# Patient Record
Sex: Female | Born: 1940 | Race: White | Hispanic: No | Smoking: Current every day smoker
Health system: Southern US, Community
[De-identification: ages and names within clinical notes are randomized; demographics above are authoritative.]

## PROBLEM LIST (undated history)

## (undated) DIAGNOSIS — I219 Acute myocardial infarction, unspecified: Secondary | ICD-10-CM

## (undated) DIAGNOSIS — I70229 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, unspecified extremity: Secondary | ICD-10-CM

## (undated) DIAGNOSIS — I739 Peripheral vascular disease, unspecified: Secondary | ICD-10-CM

## (undated) DIAGNOSIS — F32A Depression, unspecified: Secondary | ICD-10-CM

## (undated) DIAGNOSIS — I714 Abdominal aortic aneurysm, without rupture: Secondary | ICD-10-CM

## (undated) DIAGNOSIS — J449 Chronic obstructive pulmonary disease, unspecified: Secondary | ICD-10-CM

## (undated) DIAGNOSIS — I10 Essential (primary) hypertension: Secondary | ICD-10-CM

## (undated) DIAGNOSIS — M25559 Pain in unspecified hip: Secondary | ICD-10-CM

## (undated) DIAGNOSIS — M1990 Unspecified osteoarthritis, unspecified site: Secondary | ICD-10-CM

## (undated) DIAGNOSIS — E1151 Type 2 diabetes mellitus with diabetic peripheral angiopathy without gangrene: Secondary | ICD-10-CM

## (undated) DIAGNOSIS — C801 Malignant (primary) neoplasm, unspecified: Secondary | ICD-10-CM

## (undated) DIAGNOSIS — I745 Embolism and thrombosis of iliac artery: Secondary | ICD-10-CM

## (undated) DIAGNOSIS — G4730 Sleep apnea, unspecified: Secondary | ICD-10-CM

## (undated) DIAGNOSIS — F329 Major depressive disorder, single episode, unspecified: Secondary | ICD-10-CM

## (undated) DIAGNOSIS — K219 Gastro-esophageal reflux disease without esophagitis: Secondary | ICD-10-CM

## (undated) DIAGNOSIS — Z5189 Encounter for other specified aftercare: Secondary | ICD-10-CM

## (undated) DIAGNOSIS — E039 Hypothyroidism, unspecified: Secondary | ICD-10-CM

## (undated) DIAGNOSIS — E1159 Type 2 diabetes mellitus with other circulatory complications: Secondary | ICD-10-CM

## (undated) HISTORY — DX: Unspecified osteoarthritis, unspecified site: M19.90

## (undated) HISTORY — PX: TUBAL LIGATION: SHX77

## (undated) HISTORY — DX: 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, unspecified extremity: I70.229

## (undated) HISTORY — PX: CHOLECYSTECTOMY: SHX55

## (undated) HISTORY — PX: APPENDECTOMY: SHX54

## (undated) HISTORY — DX: Embolism and thrombosis of iliac artery: I74.5

## (undated) HISTORY — DX: Type 2 diabetes mellitus with diabetic peripheral angiopathy without gangrene: E11.51

## (undated) HISTORY — PX: PR VEIN BYPASS GRAFT,AORTO-FEM-POP: 35551

## (undated) HISTORY — PX: OTHER SURGICAL HISTORY: SHX169

## (undated) HISTORY — DX: Peripheral vascular disease, unspecified: I73.9

## (undated) HISTORY — DX: Pain in unspecified hip: M25.559

## (undated) HISTORY — DX: Abdominal aortic aneurysm, without rupture: I71.4

## (undated) HISTORY — DX: Malignant (primary) neoplasm, unspecified: C80.1

## (undated) HISTORY — DX: Type 2 diabetes mellitus with other circulatory complications: E11.59

---

## 1999-04-11 ENCOUNTER — Ambulatory Visit: Admission: RE | Admit: 1999-04-11 | Discharge: 1999-04-11 | Payer: Self-pay

## 2007-09-23 ENCOUNTER — Ambulatory Visit: Payer: Self-pay | Admitting: Vascular Surgery

## 2007-10-23 HISTORY — PX: BREAST SURGERY: SHX581

## 2008-12-09 ENCOUNTER — Ambulatory Visit: Payer: Self-pay | Admitting: Vascular Surgery

## 2009-10-22 DIAGNOSIS — Z5189 Encounter for other specified aftercare: Secondary | ICD-10-CM

## 2009-10-22 DIAGNOSIS — IMO0001 Reserved for inherently not codable concepts without codable children: Secondary | ICD-10-CM

## 2009-10-22 HISTORY — DX: Encounter for other specified aftercare: Z51.89

## 2009-10-22 HISTORY — DX: Reserved for inherently not codable concepts without codable children: IMO0001

## 2009-12-13 ENCOUNTER — Ambulatory Visit: Payer: Self-pay | Admitting: Vascular Surgery

## 2010-11-24 ENCOUNTER — Ambulatory Visit: Admit: 2010-11-24 | Payer: Self-pay | Admitting: Vascular Surgery

## 2010-11-24 ENCOUNTER — Ambulatory Visit: Admit: 2010-11-24 | Payer: Self-pay

## 2010-11-24 ENCOUNTER — Ambulatory Visit (INDEPENDENT_AMBULATORY_CARE_PROVIDER_SITE_OTHER): Payer: MEDICARE

## 2010-11-24 ENCOUNTER — Encounter (INDEPENDENT_AMBULATORY_CARE_PROVIDER_SITE_OTHER): Payer: MEDICARE

## 2010-11-24 DIAGNOSIS — I70219 Atherosclerosis of native arteries of extremities with intermittent claudication, unspecified extremity: Secondary | ICD-10-CM

## 2010-11-28 ENCOUNTER — Ambulatory Visit (INDEPENDENT_AMBULATORY_CARE_PROVIDER_SITE_OTHER): Payer: MEDICARE | Admitting: Vascular Surgery

## 2010-11-28 DIAGNOSIS — I70219 Atherosclerosis of native arteries of extremities with intermittent claudication, unspecified extremity: Secondary | ICD-10-CM

## 2010-12-01 NOTE — Consult Note (Signed)
NEW PATIENT CONSULTATION  Alicia, Saunders DOB:  [birth year]                                       11/28/2010 [id number]#:[id number]  The patient is a 70 year old female known to our practice, having previously had right iliac PTA and stenting by Dr. Anselmo Pickler in 1995.  Her claudication symptoms were completely relieved but she has continued to have some back problems due to a bulging lumbar disk.  Over the last few years she has developed increasing discomfort to the left hip greater than the right, in which she develops heaviness and discomfort extending down into the thigh and calf areas after walking only a short period of time (a few minutes).  This is relieved by rest and then recurs if she walks further.  She also has occasional instability and discomfort in her legs when she first arises, which is of a different nature.  She has no history of nonhealing ulcers, infection, cellulitis or gangrene, but the walking symptoms are now limiting her.  CHRONIC MEDICAL PROBLEMS: 1. Diabetes. 2. Hypertension. 3. Hyperlipidemia. 4. History of breast cancer treated by radiation and chemotherapy. 5. Negative for coronary artery disease, COPD or stroke.  SOCIAL HISTORY:  She is married, has 4 children and is retired. Continues to smoke a pack of cigarettes per day for 50+ years.  Does not use alcohol.  FAMILY HISTORY:  Positive for stroke, coronary artery disease and diabetes in her mother, diabetes in a brother.  REVIEW OF SYSTEMS:  Negative chest pain, dyspnea on exertion, PND, orthopnea.  No bronchitis, hemoptysis.  Does have occasional productive cough.  Complains of lower back discomfort.  All other systems in complete review of systems are negative.  PHYSICAL EXAMINATION:  Blood pressure 183/80, heart rate 73, respirations 18.  General:  She is a well-developed, well-nourished female in no apparent distress, alert and oriented x3.  HEENT:  Exam normal for  age.  EOMs intact.  Lungs:  Clear to auscultation.  No rhonchi or wheezing.  Cardiovascular:  Regular rhythm.  No murmurs. Carotid pulses are 3+.  No audible bruits.  Abdomen:  Soft, nontender, with no palpable masses.  Musculoskeletal:  Free of major deformities. Neurologic:  Normal.  Skin:  Free of rashes.  Lower extremity exam reveals 3+ femoral pulse on the right, 1+ femoral pulse on the left. She has 1+ to 2+ popliteal and 2+ posterior tibial pulses bilaterally.  Today I have reviewed and interpreted her lower extremity arterial Doppler study, which revealed an ABI of 0.82 on the left, which is down from 0.95 a year ago, and 0.97 on the right.  She did drop her pressure significantly after exercise after 2.5 minutes, when developing symptoms in her hips and thighs.  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  Arranged for angiograms be done by Dr. Imogene Burn on Thursday, February 16, with PTA and stenting to be performed at that time if indicated.    [name], M.D. Electronically Signed  JDL/MEDQ  D:  11/28/2010  T:  11/29/2010  Job:  [id number]  cc:   Dr. Foye Deer

## 2010-12-07 ENCOUNTER — Ambulatory Visit (HOSPITAL_COMMUNITY)
Admission: RE | Admit: 2010-12-07 | Discharge: 2010-12-07 | Disposition: A | Discharge status: Home / Self Care | Payer: Medicare Other | Source: Ambulatory Visit | Attending: Vascular Surgery | Admitting: Vascular Surgery

## 2010-12-07 DIAGNOSIS — I70219 Atherosclerosis of native arteries of extremities with intermittent claudication, unspecified extremity: Secondary | ICD-10-CM | POA: Insufficient documentation

## 2010-12-07 DIAGNOSIS — I7092 Chronic total occlusion of artery of the extremities: Secondary | ICD-10-CM

## 2010-12-07 DIAGNOSIS — Z01812 Encounter for preprocedural laboratory examination: Secondary | ICD-10-CM | POA: Insufficient documentation

## 2010-12-07 DIAGNOSIS — Z01818 Encounter for other preprocedural examination: Secondary | ICD-10-CM | POA: Insufficient documentation

## 2010-12-07 LAB — POCT I-STAT, CHEM 8
BUN: 12 mg/dL (ref 6–23)
Potassium: 4.2 mEq/L (ref 3.5–5.1)
Sodium: 139 mEq/L (ref 135–145)
TCO2: 28 mmol/L (ref 0–100)

## 2010-12-07 LAB — GLUCOSE, CAPILLARY
Glucose-Capillary: 219 mg/dL — ABNORMAL HIGH (ref 70–99)
Glucose-Capillary: 307 mg/dL — ABNORMAL HIGH (ref 70–99)

## 2010-12-11 NOTE — Op Note (Signed)
[name], [name]               ACCOUNT NO.:  [id number]  MEDICAL RECORD NO.:  [id number]           PATIENT TYPE:  O  LOCATION:  [facility]SC                         FACILITY:  [facility]  PHYSICIAN:  Fransisco Hertz, MD       DATE OF BIRTH:  [birth year]  DATE OF PROCEDURE:  12/07/2010 DATE OF DISCHARGE:                              OPERATIVE REPORT   PROCEDURE: 1. Left common femoral artery cannulation under ultrasound guidance. 2. Aortogram. 3. Bilateral leg runoff.  PREOPERATIVE DIAGNOSIS:  Thigh claudication, left greater than right.  POSTOPERATIVE DIAGNOSIS:  Thigh claudication, left greater than right.  SURGEON:  Fransisco Hertz, MD.  ESTIMATED BLOOD LOSS:  Minimal.  CONTRAST:  117 mL.  ANESTHESIA:  Conscious sedation.  FINDINGS: 1. A patent aorta. 2. Patent bilateral renal arteries. 3. Patent superior mesenteric artery. 4. The aorta tapers until distally it is only about 7 mm with a     calcified plaque on the left side of the distal aorta extending     into the left common iliac artery. 5. Bilateral common iliac arteries have stenoses, on the right side it     is about 50% and on the left side greater than 50%. 6. Lateral common iliac, external iliac, and internal iliac arteries     are patent. 7. Bilateral internal iliac arteries also have stenoses, the right is     less than 50%, the left is about 60%. 8. Bilateral common femoral artery, profunda artery, superficial     femoral arteries, and popliteal arteries were widely patent,     minimal disease. 9. Bilateral trifurcations and posterior tibial, anterior tibial, and     peroneal arteries are all patent. 10.The bilateral plantar arches fill.  COMPLICATIONS:  None.  CONDITION:  Stable.  INDICATIONS:  This is a 70 year old female with short distance claudication in her hip and thigh.  It was felt based on examination that she had likely iliac occlusive disease, so attempt at the aortogram with possible intervention  was recommended.  The patient is aware of the risks of this procedure which include bleeding, infection, possible embolization, possible rupture of treated vessels, and possible need for surgical intervention.  The patient was aware of the risks and agreed to proceed for the such.  DESCRIPTION AND OPERATIONS:  After full informed written consent was obtained from the patient, she was brought back to the angio suite, placed supine upon the angio table.  She was connected to monitoring equipment and then given conscious sedation, the amounts of which are documented in our chart.  She was then prepped and draped in the standard fashion for an aortogram and bilateral leg runoff.  I turned my attention to her left groin, which was the more symptomatic side and then identified the left common femoral artery under ultrasound guidance. I cannulated it with a micropuncture needle and put the microwire up into the iliac system.  The needle was exchanged for a microsheath and then the wire exchanged for a Bentson wire, which was advanced easily into the aorta.  The sheath was then exchanged for a 5-French sheath without  any difficulties.  An Omni-flush catheter was then loaded over the Bentson wire and passed to the level of L1.  The catheter was connected to the power injector circuit after performing de-airing and declotting maneuver.  The patient was set up for a power injector aortogram run, the findings of which are listed above.  I then pulled down the catheter down to the level of the bifurcation and then we performed a dedicated oblique view of the pelvis and based on this findings, I had concerns that with this plaque extending from the aorta down into the left common iliac.  Based on digital caliper measurements, both common iliac arteries were at least 7-8 mm in diameter and that a kissing technique would be necessary to place stents in the left and right side. As the distal aorta was  only 7 mm in diameter, this would possibly result in rupture if stents were placed.  I had Dr. Hart Rochester review these  images and he agreed that an attempt at endovascular intervention may be  dangerous in this case.  We then performed automated bilateral leg runoff to  image the rest of this patient's runoffs vessels.  At this point, no further intervention was needed.  I put the Bentson wire back through the Omni- flush catheter, reconstituted the crook of this catheter and pulled out the catheter and wire.  The sheath was then aspirated and then flushed with heparinized saline.  There is no clot present in the sheath.  The sheath was then pulled in the holding area without any difficulties.  Complications: none.  Condition: stable.     Fransisco Hertz, MD     BLC/MEDQ  D:  12/07/2010  T:  12/08/2010  Job:  [id number]  Electronically Signed by Leonides Sake MD on 12/11/2010 09:17:28 AM

## 2010-12-14 ENCOUNTER — Ambulatory Visit: Payer: MEDICARE

## 2010-12-18 ENCOUNTER — Ambulatory Visit (HOSPITAL_COMMUNITY)
Admission: RE | Admit: 2010-12-18 | Discharge: 2010-12-18 | Disposition: A | Discharge status: Home / Self Care | Payer: Medicare Other | Source: Ambulatory Visit | Attending: Vascular Surgery | Admitting: Vascular Surgery

## 2010-12-18 ENCOUNTER — Other Ambulatory Visit: Payer: Self-pay | Admitting: Vascular Surgery

## 2010-12-18 ENCOUNTER — Encounter (HOSPITAL_COMMUNITY): Payer: Medicare Other

## 2010-12-18 ENCOUNTER — Encounter (HOSPITAL_COMMUNITY)
Admission: RE | Admit: 2010-12-18 | Discharge: 2010-12-18 | Disposition: A | Discharge status: Home / Self Care | Payer: Medicare Other | Source: Ambulatory Visit | Attending: Vascular Surgery | Admitting: Vascular Surgery

## 2010-12-18 DIAGNOSIS — R059 Cough, unspecified: Secondary | ICD-10-CM | POA: Insufficient documentation

## 2010-12-18 DIAGNOSIS — Z01810 Encounter for preprocedural cardiovascular examination: Secondary | ICD-10-CM | POA: Insufficient documentation

## 2010-12-18 DIAGNOSIS — Z01818 Encounter for other preprocedural examination: Secondary | ICD-10-CM | POA: Insufficient documentation

## 2010-12-18 DIAGNOSIS — R05 Cough: Secondary | ICD-10-CM | POA: Insufficient documentation

## 2010-12-18 DIAGNOSIS — Z01811 Encounter for preprocedural respiratory examination: Secondary | ICD-10-CM

## 2010-12-18 DIAGNOSIS — I739 Peripheral vascular disease, unspecified: Secondary | ICD-10-CM | POA: Insufficient documentation

## 2010-12-18 DIAGNOSIS — Z01812 Encounter for preprocedural laboratory examination: Secondary | ICD-10-CM | POA: Insufficient documentation

## 2010-12-18 LAB — BLOOD GAS, ARTERIAL
Acid-Base Excess: 2.5 mmol/L — ABNORMAL HIGH (ref 0.0–2.0)
Drawn by: 206361
FIO2: 0.21 %
O2 Saturation: 97 %
pCO2 arterial: 40.8 mmHg (ref 35.0–45.0)

## 2010-12-18 LAB — COMPREHENSIVE METABOLIC PANEL
AST: 21 U/L (ref 0–37)
Albumin: 4.2 g/dL (ref 3.5–5.2)
Alkaline Phosphatase: 76 U/L (ref 39–117)
Chloride: 101 mEq/L (ref 96–112)
Creatinine, Ser: 1 mg/dL (ref 0.4–1.2)
GFR calc Af Amer: >60 mL/min (ref >60)
Potassium: 4 mEq/L (ref 3.5–5.1)
Sodium: 139 mEq/L (ref 135–145)
Total Bilirubin: 0.5 mg/dL (ref 0.3–1.2)

## 2010-12-18 LAB — URINALYSIS, ROUTINE W REFLEX MICROSCOPIC
Bilirubin Urine: NEGATIVE
Hgb urine dipstick: NEGATIVE
Ketones, ur: NEGATIVE mg/dL
Urine Glucose, Fasting: 500 mg/dL — AB
pH: 6.5 (ref 5.0–8.0)

## 2010-12-18 LAB — SURGICAL PCR SCREEN: MRSA, PCR: NEGATIVE

## 2010-12-18 LAB — ABO/RH: ABO/RH(D): O POS

## 2010-12-18 LAB — CBC
MCH: 30.6 pg (ref 26.0–34.0)
MCV: 89 fL (ref 78.0–100.0)
Platelets: 259 10*3/uL (ref 150–400)
RDW: 14.2 % (ref 11.5–15.5)

## 2010-12-18 LAB — URINE MICROSCOPIC-ADD ON

## 2010-12-20 ENCOUNTER — Inpatient Hospital Stay (HOSPITAL_COMMUNITY)
Admission: RE | Admit: 2010-12-20 | Discharge: 2010-12-25 | DRG: 238 | Disposition: A | Discharge status: Home / Self Care | Payer: Medicare Other | Source: Ambulatory Visit | Attending: Vascular Surgery | Admitting: Vascular Surgery

## 2010-12-20 ENCOUNTER — Inpatient Hospital Stay (HOSPITAL_COMMUNITY): Payer: Medicare Other

## 2010-12-20 DIAGNOSIS — Z7982 Long term (current) use of aspirin: Secondary | ICD-10-CM

## 2010-12-20 DIAGNOSIS — E039 Hypothyroidism, unspecified: Secondary | ICD-10-CM | POA: Diagnosis present

## 2010-12-20 DIAGNOSIS — I10 Essential (primary) hypertension: Secondary | ICD-10-CM | POA: Diagnosis present

## 2010-12-20 DIAGNOSIS — I7409 Other arterial embolism and thrombosis of abdominal aorta: Principal | ICD-10-CM | POA: Diagnosis present

## 2010-12-20 DIAGNOSIS — E785 Hyperlipidemia, unspecified: Secondary | ICD-10-CM | POA: Diagnosis present

## 2010-12-20 DIAGNOSIS — E119 Type 2 diabetes mellitus without complications: Secondary | ICD-10-CM | POA: Diagnosis present

## 2010-12-20 DIAGNOSIS — I739 Peripheral vascular disease, unspecified: Secondary | ICD-10-CM | POA: Diagnosis present

## 2010-12-20 DIAGNOSIS — Z794 Long term (current) use of insulin: Secondary | ICD-10-CM

## 2010-12-20 DIAGNOSIS — Z23 Encounter for immunization: Secondary | ICD-10-CM

## 2010-12-20 DIAGNOSIS — Z853 Personal history of malignant neoplasm of breast: Secondary | ICD-10-CM

## 2010-12-20 LAB — PROTIME-INR: Prothrombin Time: 15 seconds (ref 11.6–15.2)

## 2010-12-20 LAB — CBC
MCH: 29.8 pg (ref 26.0–34.0)
MCHC: 33.3 g/dL (ref 30.0–36.0)
Platelets: 232 10*3/uL (ref 150–400)
RBC: 4.16 MIL/uL (ref 3.87–5.11)
RDW: 14.3 % (ref 11.5–15.5)

## 2010-12-20 LAB — GLUCOSE, CAPILLARY
Glucose-Capillary: 208 mg/dL — ABNORMAL HIGH (ref 70–99)
Glucose-Capillary: 300 mg/dL — ABNORMAL HIGH (ref 70–99)

## 2010-12-20 LAB — POCT I-STAT 4, (NA,K, GLUC, HGB,HCT): Hemoglobin: 14.3 g/dL (ref 12.0–15.0)

## 2010-12-20 LAB — BASIC METABOLIC PANEL
BUN: 8 mg/dL (ref 6–23)
CO2: 29 mEq/L (ref 19–32)
Calcium: 7.9 mg/dL — ABNORMAL LOW (ref 8.4–10.5)
Creatinine, Ser: 0.82 mg/dL (ref 0.4–1.2)
Glucose, Bld: 215 mg/dL — ABNORMAL HIGH (ref 70–99)

## 2010-12-21 ENCOUNTER — Inpatient Hospital Stay (HOSPITAL_COMMUNITY): Payer: Medicare Other

## 2010-12-21 LAB — COMPREHENSIVE METABOLIC PANEL
ALT: 18 U/L (ref 0–35)
AST: 26 U/L (ref 0–37)
Alkaline Phosphatase: 40 U/L (ref 39–117)
CO2: 26 mEq/L (ref 19–32)
Chloride: 107 mEq/L (ref 96–112)
Creatinine, Ser: 0.85 mg/dL (ref 0.4–1.2)
GFR calc Af Amer: >60 mL/min (ref >60)
GFR calc non Af Amer: >60 mL/min (ref >60)
Potassium: 3.9 mEq/L (ref 3.5–5.1)
Total Bilirubin: 0.5 mg/dL (ref 0.3–1.2)

## 2010-12-21 LAB — CBC
MCH: 30.1 pg (ref 26.0–34.0)
MCHC: 33.4 g/dL (ref 30.0–36.0)
Platelets: 200 10*3/uL (ref 150–400)
RBC: 4.02 MIL/uL (ref 3.87–5.11)
RDW: 14.7 % (ref 11.5–15.5)

## 2010-12-21 LAB — GLUCOSE, CAPILLARY
Glucose-Capillary: 137 mg/dL — ABNORMAL HIGH (ref 70–99)
Glucose-Capillary: 141 mg/dL — ABNORMAL HIGH (ref 70–99)

## 2010-12-21 LAB — HEMOGLOBIN A1C: Hgb A1c MFr Bld: 10.7 % — ABNORMAL HIGH (ref <5.7)

## 2010-12-22 LAB — CBC
HCT: 32.7 % — ABNORMAL LOW (ref 36.0–46.0)
Hemoglobin: 10.8 g/dL — ABNORMAL LOW (ref 12.0–15.0)
MCH: 29.8 pg (ref 26.0–34.0)
MCHC: 33 g/dL (ref 30.0–36.0)
RBC: 3.62 MIL/uL — ABNORMAL LOW (ref 3.87–5.11)

## 2010-12-22 LAB — CROSSMATCH: Unit division: 0

## 2010-12-22 LAB — BASIC METABOLIC PANEL
CO2: 23 mEq/L (ref 19–32)
Calcium: 7.8 mg/dL — ABNORMAL LOW (ref 8.4–10.5)
Chloride: 106 mEq/L (ref 96–112)
Creatinine, Ser: 0.79 mg/dL (ref 0.4–1.2)
Glucose, Bld: 159 mg/dL — ABNORMAL HIGH (ref 70–99)

## 2010-12-22 LAB — GLUCOSE, CAPILLARY
Glucose-Capillary: 167 mg/dL — ABNORMAL HIGH (ref 70–99)
Glucose-Capillary: 146 mg/dL — ABNORMAL HIGH (ref 70–99)

## 2010-12-23 LAB — GLUCOSE, CAPILLARY
Glucose-Capillary: 101 mg/dL — ABNORMAL HIGH (ref 70–99)
Glucose-Capillary: 105 mg/dL — ABNORMAL HIGH (ref 70–99)
Glucose-Capillary: 105 mg/dL — ABNORMAL HIGH (ref 70–99)
Glucose-Capillary: 119 mg/dL — ABNORMAL HIGH (ref 70–99)
Glucose-Capillary: 62 mg/dL — ABNORMAL LOW (ref 70–99)
Glucose-Capillary: 73 mg/dL (ref 70–99)

## 2010-12-24 LAB — GLUCOSE, CAPILLARY
Glucose-Capillary: 55 mg/dL — ABNORMAL LOW (ref 70–99)
Glucose-Capillary: 82 mg/dL (ref 70–99)

## 2010-12-25 LAB — GLUCOSE, CAPILLARY
Glucose-Capillary: 102 mg/dL — ABNORMAL HIGH (ref 70–99)
Glucose-Capillary: 182 mg/dL — ABNORMAL HIGH (ref 70–99)

## 2010-12-25 NOTE — Op Note (Signed)
[name], [name]               ACCOUNT NO.:  [id number]  MEDICAL RECORD NO.:  [id number]           PATIENT TYPE:  I  LOCATION:  2314                         FACILITY:  [facility]  PHYSICIAN:  Quita Skye. Hart Rochester, M.D.  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  OPERATION:  Aortobifemoral bypass graft using a 12 x 7-mm Hemashield Dacron graft.  SURGEON:  Quita Skye. Hart Rochester, MD  FIRST ASSISTANT: 1. Charlena Cross, MD  SECOND ASSISTANT:  Della Goo, PA-C  ANESTHESIA:  General endotracheal.  PROCEDURE:  The patient was taken to the operating room, placed in supine position, at which time satisfactory general endotracheal anesthesia was administered.  Abdomen and groins were prepped with Betadine scrub and solution, draped in routine sterile manner.  Swan- Ganz catheter and a radial arterial line were placed by Anesthesia preoperatively.  Short longitudinal incisions were made in both inguinal areas, carried down through subcutaneous tissue.  The common superficial and profunda femoris arteries were dissected free, encircled with vessel loops.  Both vessels had posterior plaques and had very weak pulses. Longitudinal incision was made from xiphoid to just below the umbilicus, carried down through subcutaneous tissue and linea alba with the Bovie. Peritoneal cavity was entered and thoroughly explored.  There were a few adhesions in the right upper quadrant between the greater omentum and anterior abdominal wall which were lysed.  Exploration of the abdominal cavity revealed the stomach, duodenum, small bowel, and colon to be unremarkable.  The gallbladder had been previously removed.  The uterus and adnexa were  unremarkable to gross inspection.  Transverse colon was elevated and intestines were reflected to the right side, and the aorta was exposed and the renal arteries to the bifurcation.  It was heavily calcified distally in a concentric fashion and both common iliac arteries were concentrically calcified.  The aorta was soft just distal to the renal artery.  Retroperitoneal tunnels were created posterior to the ureters.  The patient was given 25 g of mannitol and heparinized. Aorta was then occluded distal to the renal arteries, transected, the distal end oversewn with two layers of 3-0 Prolene buttressing this with two strips of felt.  Proximal aortic stump was quite small as was the entire aorta.  It did have some mild plaquing but in general was widely patent with no ulceration.  It necessitated using a 12-mm aortic graft because of the small size.  A 12 x 7-mm Hemashield Dacron graft was anastomosed end-to-end in the proximal aortic stump about 3 cm distal to the renal arteries.  This was done with continuous 3-0 Prolene buttressing this with a strip of felt.  This was checked for leaks, none were present.  Limbs were delivered through the retroperitoneal tunnels end-to-side anastomoses were done to the common femoral arteries bilaterally with 5-0 Prolene.  Following completion of this, each leg was opened with no significant hypotension.  Following this, protamine was  given to reverse the heparin.  Following adequate hemostasis, the groins were closed in layers with Vicryl in a subcuticular fashion with Dermabond.  Retroperitoneum was approximated with 3-0 Vicryl, linea alba with #1 Prolene, and the skin with clips.  Sterile dressing applied.  The patient was taken to recovery room in stable condition. Estimated blood loss was approximately 300 mL and no blood reinfused, and the patient had excellent urinary output and was stable hemodynamically.     [name],  M.D.     JDL/MEDQ  D:  12/20/2010  T:  12/21/2010  Job:  [id number]  Electronically Signed by [name] M.D. on 12/25/2010 02:23:09 PM

## 2010-12-26 NOTE — Discharge Summary (Addendum)
[name]               ACCOUNT NO.:  [id number]  MEDICAL RECORD NO.:  [id number]           PATIENT TYPE:  I  LOCATION:  2020                         FACILITY:  [facility]  PHYSICIAN:  Quita Skye. Hart Rochester, M.D.  DATE OF BIRTH:  1941/02/02  DATE OF ADMISSION:  12/20/2010 DATE OF DISCHARGE:  12/25/2010                              DISCHARGE SUMMARY   CHIEF COMPLAINT:  Aortoiliac occlusive disease with bilateral claudication.  HISTORY OF PRESENT ILLNESS:  [name] is a 70 year old woman who is well-known to our practice with a previous right iliac stent done by Dr. Jean Rosenthal in 1995.  Claudication symptoms were completely relieved, but she has continued to have some back problems due to bulging lumbar disk. Last few years, she has developed increasing discomfort in the left hip greater than the right and she developed heaviness and discomfort descending down to the thigh and calf areas after walking only a short period of time.  This is relieved with rest and then recurs if she walks further.  She also has had no history of nonhealing ulcers, infections, cellulitis, or gangrene.  The locking symptoms are now limiting her activity.  She was admitted for an aortobifemoral bypass secondary to aortoiliac occlusive disease.  PAST MEDICAL HISTORY: 1. Diabetes. 2. Hypertension. 3. Hyperlipidemia. 4. History of breast cancer treated with radiation and chemotherapy. 5. Negative for coronary artery disease, COPD, or stroke.  HOSPITAL COURSE:  The patient was taken to the operating room on December 20, 2010 for an aortobifemoral bypass with 12 x seventh 7 mm Dacron graft.  Postoperatively, the patient did well.  She had minimal drainage from her NG tube.  She had 3+ DP and PT pulses bilaterally. Both of her feet were warm.  She was begun on half of her dose of Lantus with good result.  Her NG tube was discontinued on the second postoperative day.  She was transferred to the floor.  She  was begun on ambulation.  Her wounds were healing well.  Diet was advanced over the next several days and she was discharged to home on December 25, 2010.  She will follow up in the office in 2 weeks.  FINAL DIAGNOSES: 1. Aortoiliac occlusive disease, status post aortobifemoral bypass. 2. Her other chronic medical issues were stable while in the hospital     and treated with present medications with some adjustments.  DISPOSITION:  The patient was discharged to home.  She will follow up with Dr. Hart Rochester in 2 weeks.  DISCHARGE MEDICATIONS: 1. Insulin Lantus 30 units twice a day which is changed from her     normal 60 units twice daily. 2. Percocet 5/325, 1-2 tablets every 4 hours as needed for pain. 3. Alendronate 70 mg every Sunday. 4. Anastrozole 1 mg daily. 5. Aspirin 81 mg 2 tablets twice daily. 6. Atenolol 50 mg daily. 7. Fenofibrate 160 mg daily. 8. Fish oil 1000 mg 3 times a day. 9. Levothyroxine 100 mcg daily. 10.Lisinopril 40 mg daily. 11.NovoLog insulin the sliding scale and 15 units before meals. 12.Paroxetine 40 mg daily. 13.Prozac 1 capsule twice daily. 14.Ropinirole 1 mg daily  at bedtime.     Della Goo, PA-C   ______________________________ [name], M.D.    RR/MEDQ  D:  12/25/2010  T:  12/26/2010  Job:  [id number]  Electronically Signed by [name] M.D. on 12/26/2010 10:38:03 AM Electronically Signed by [name] M.D. on 12/26/2010 10:45:40 AM Electronically Signed by [name] M.D. on 12/26/2010 10:55:45 AM Electronically Signed by [name] M.D. on 12/26/2010 11:05:48 AM Electronically Signed by [name] M.D. on 12/26/2010 11:16:09 AM Electronically Signed by [name] M.D. on 12/26/2010 11:28:24 AM Electronically Signed by [name] M.D. on 12/26/2010 11:41:42 AM Electronically Signed by [name] M.D. on 12/26/2010 11:55:16 AM Electronically Signed by [name] M.D. on 12/26/2010 12:09:44 PM Electronically Signed  by [name] M.D. on 12/26/2010 12:25:17 PM Electronically Signed by [name] M.D. on 12/26/2010 12:41:47 PM Electronically Signed by [name] M.D. on 12/26/2010 12:59:55 PM Electronically Signed by [name] M.D. on 12/26/2010 01:19:23 PM Electronically Signed by [name] M.D. on 12/26/2010 01:40:50 PM Electronically Signed by [name] M.D. on 12/26/2010 02:00:53 PM Electronically Signed by [name] M.D. on 12/26/2010 02:21:08 PM Electronically Signed by [name] M.D. on 12/26/2010 02:43:30 PM Electronically Signed by [name] M.D. on 12/26/2010 03:07:01 PM Electronically Signed by [name] M.D. on 12/26/2010 03:31:53 PM Electronically Signed by [name] M.D. on 12/26/2010 04:05:56 PM Electronically Signed by [name] M.D. on 12/26/2010 04:37:23 PM Electronically Signed by [name] M.D. on 12/26/2010 05:08:55 PM Electronically Signed by [name] M.D. on 12/26/2010 05:08:55 PM Electronically Signed by [name] M.D. on 12/26/2010 05:37:33 PM Electronically Signed by [name] M.D. on 12/26/2010 81:19:14 PM Electronically Signed by [name] M.D. on 12/26/2010 07:43:20 PM Electronically Signed by Della Goo PA on 12/30/2010 09:01:59 AM

## 2011-01-04 ENCOUNTER — Encounter (INDEPENDENT_AMBULATORY_CARE_PROVIDER_SITE_OTHER): Payer: Medicare Other

## 2011-01-04 DIAGNOSIS — I70219 Atherosclerosis of native arteries of extremities with intermittent claudication, unspecified extremity: Secondary | ICD-10-CM

## 2011-01-16 ENCOUNTER — Ambulatory Visit (INDEPENDENT_AMBULATORY_CARE_PROVIDER_SITE_OTHER): Payer: Medicare Other | Admitting: Vascular Surgery

## 2011-01-16 ENCOUNTER — Encounter (INDEPENDENT_AMBULATORY_CARE_PROVIDER_SITE_OTHER): Payer: Medicare Other

## 2011-01-16 DIAGNOSIS — I70219 Atherosclerosis of native arteries of extremities with intermittent claudication, unspecified extremity: Secondary | ICD-10-CM

## 2011-01-16 DIAGNOSIS — I739 Peripheral vascular disease, unspecified: Secondary | ICD-10-CM

## 2011-01-16 DIAGNOSIS — Z48812 Encounter for surgical aftercare following surgery on the circulatory system: Secondary | ICD-10-CM

## 2011-01-16 NOTE — Assessment & Plan Note (Signed)
OFFICE VISIT  [name], [name] DOB:  [birth year]                                       01/16/2011 [id number]#:[id number]  The patient returns today for initial followup regarding aortobifemoral bypass graft I performed February 29 for severe aortoiliac occlusive disease and severe claudication both legs.  Her claudication symptoms have been completely relieved with no rest pain or cramping or aching in her legs with ambulation.  She continues to have a poor appetite although she is taking nutritional supplement.  Bowel movements have returned to normal.  She has had no chest pain or dyspnea on exertion and is continuing to smoke.  PHYSICAL EXAMINATION:  Vital signs:  Her blood pressure is 153/73, heart rate 71, respirations 14, temperature 98.1.  Abdomen:  Exam revealed well-healed midline incision with no evidence of ventral hernia.  She had one small area of skin necrosis in the apex of the right inguinal area which was debrided leaving only a 0.5 cm opening which is not deep and should heal within the next week or so.  She has 3+ femoral, posterior tibial pulses bilaterally.  ABIs today are 1.08 on the right and 0.98 on the left.  I reassured her regarding these findings, encouraged her to discontinue smoking and she will talk to Dr. Tomasa Blase her medical doctor regarding this.  She will return in 2 months for continued followup.    [name], M.D. Electronically Signed  JDL/MEDQ  D:  01/16/2011  T:  01/16/2011  Job:  [id number]

## 2011-03-20 ENCOUNTER — Ambulatory Visit: Payer: Medicare Other | Admitting: Vascular Surgery

## 2012-01-15 ENCOUNTER — Ambulatory Visit (INDEPENDENT_AMBULATORY_CARE_PROVIDER_SITE_OTHER): Payer: Medicare Other | Admitting: *Deleted

## 2012-01-15 ENCOUNTER — Ambulatory Visit (INDEPENDENT_AMBULATORY_CARE_PROVIDER_SITE_OTHER): Payer: Medicare Other | Admitting: Neurosurgery

## 2012-01-15 ENCOUNTER — Encounter (INDEPENDENT_AMBULATORY_CARE_PROVIDER_SITE_OTHER): Payer: Medicare Other | Admitting: *Deleted

## 2012-01-15 DIAGNOSIS — I739 Peripheral vascular disease, unspecified: Secondary | ICD-10-CM

## 2012-01-15 DIAGNOSIS — Z48812 Encounter for surgical aftercare following surgery on the circulatory system: Secondary | ICD-10-CM

## 2012-01-15 DIAGNOSIS — I70612 Atherosclerosis of nonbiological bypass graft(s) of the extremities with intermittent claudication, left leg: Secondary | ICD-10-CM

## 2012-01-15 DIAGNOSIS — I70309 Unspecified atherosclerosis of unspecified type of bypass graft(s) of the extremities, unspecified extremity: Secondary | ICD-10-CM

## 2012-01-15 NOTE — Progress Notes (Signed)
[facility] HISTORY AND PHYSICAL   Referring Physician: Dr. Hart Rochester  History of Present Illness: This 71 year old female is a patient of Dr. Hart Rochester who underwent aortobifemoral bypass graft in February of 2012.  She returns to clinic today for lower extremity duplex graft scan as well as ABIs. There has been some progression of disease on the left, however her complaints are of rest pain mostly radiating from the foot upward to the groin at night. There is some occasional "cramp" in the right leg as well.  No past medical history on file.  ROS: [x]  Positive   [ ]  Denies    General: [ ]  Weight loss, [ ]  Fever, [ ]  chills Neurologic: [ ]  Dizziness, [ ]  Blackouts, [ ]  Seizure [ ]  Stroke, [ ]  "Mini stroke", [ ]  Slurred speech, [ ]  Temporary blindness; [ ]  weakness in arms or legs, [ ]  Hoarseness Cardiac: [ ]  Chest pain/pressure, [ ]  Shortness of breath at rest [ ]  Shortness of breath with exertion, [ ]  Atrial fibrillation or irregular heartbeat Vascular: [ ]  Pain in legs with walking, [x ] Pain in legs at rest, [x ] Pain in legs at night,  [ ]  Non-healing ulcer, [ ]  Blood clot in vein/DVT,   Pulmonary: [ ]  Home oxygen, [ ]  Productive cough, [ ]  Coughing up blood, [ ]  Asthma,  [ ]  Wheezing Musculoskeletal:  [ ]  Arthritis, [ ]  Low back pain, [ ]  Joint pain Hematologic: [ ]  Easy Bruising, [ ]  Anemia; [ ]  Hepatitis Gastrointestinal: [ ]  Blood in stool, [ ]  Gastroesophageal Reflux/heartburn, [ ]  Trouble swallowing Urinary: [ ]  chronic Kidney disease, [ ]  on HD - [ ]  MWF or [ ]  TTHS, [ ]  Burning with urination, [ ]  Difficulty urinating Skin: [ ]  Rashes, [ ]  Wounds Psychological: [ ]  Anxiety, [ ]  Depression   Social History History  Substance Use Topics  . Smoking status: Not on file  . Smokeless tobacco: Not on file  . Alcohol Use: Not on file    Family History No family history on file.  Not on File  No current outpatient prescriptions on file.     Physical Examination Blood pressure 154/75, O2 sat 97% on room air, pulse 57, respirations 16. 141 pounds 52 inches tall   General:  WDWN in NAD Gait: Normal HEENT: WNL Eyes: Pupils equal Pulmonary: normal non-labored breathing , without Rales, rhonchi,  wheezing Cardiac: RRR, without  Murmurs, rubs or gallops; No carotid bruits Abdomen: soft, NT, no masses Skin: no rashes, ulcers noted Vascular Exam/Pulses: Radial pulses 2+ bilaterally, PT and DP on the right 1+ PT and DP on the left and nonpalpable.  Extremities without ischemic changes, no Gangrene , no cellulitis; no open wounds;  Musculoskeletal: no muscle wasting or atrophy  Neurologic: A&O X 3; Appropriate Affect ; SENSATION: normal; MOTOR FUNCTION:  moving all extremities equally. Speech is fluent/normal  Non-Invasive Vascular Imaging: Right PT 142, DP 125. Left PT 102, DP 102. ABI is 1.01 on the right, 0.72 on the left. Lower extremity deep Plex bypass graft scan shows a velocity increase of 4 61 cm/s just distal to the distal anastomosis.  ASSESSMENT/PLAN: I discussed the above with Dr. Hart Rochester then with the patient and her husband. Dr. Hart Rochester feels at this time the best move going forward would be a lower extremity arteriogram with bilateral runoff which will be scheduled for the next 2-3 weeks and then followup with Dr. Hart Rochester in his clinic.  The patient and her husband are in agreement with this their questions were encouraged and answered  Lauree Chandler ANP  Clinic M.D.: Hart Rochester

## 2012-01-22 NOTE — Procedures (Unsigned)
BYPASS GRAFT EVALUATION  INDICATION:  Decreased left ABI; 1 year status post aortobifemoral graft.  HISTORY: Diabetes:  Yes Cardiac: Hypertension:  Yes Smoking:  Yes Previous Surgery:  Right iliac angioplasty 09/28/1994, aortobifemoral graft 12/20/2010  SINGLE LEVEL ARTERIAL EXAM                              RIGHT              LEFT Brachial: Anterior tibial: Posterior tibial: Peroneal: Ankle/brachial index:        1.01               0.72  PREVIOUS ABI:  Date: 01/16/2011  RIGHT:  1.08  LEFT:  0.99  LOWER EXTREMITY BYPASS GRAFT DUPLEX EXAM:  DUPLEX:  Widely patent left limb of the aortobifem graft without evidence of stenosis or hyperplasia.  Waveforms throughout the limb are monophasic.  There is a severe stenosis in the native outflow, femoral artery approximately 0.7 cm distal to the anastomosis.  Velocities are 461 cm per second.  Please see attached diagram for details.  IMPRESSION: 1. Widely patent left limb of aortobifemoral graft with monophasic     waveforms. 2. 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  ___________________________________________ Quita Skye. Hart Rochester, M.D.  LT/MEDQ  D:  01/15/2012  T:  01/15/2012  Job:  [id number]

## 2012-01-25 ENCOUNTER — Encounter (HOSPITAL_COMMUNITY): Payer: Self-pay | Admitting: Pharmacy Technician

## 2012-01-25 ENCOUNTER — Other Ambulatory Visit: Payer: Self-pay

## 2012-01-31 ENCOUNTER — Encounter (HOSPITAL_COMMUNITY)
Admission: RE | Disposition: A | Discharge status: Home / Self Care | Payer: Self-pay | Source: Ambulatory Visit | Attending: Vascular Surgery

## 2012-01-31 ENCOUNTER — Ambulatory Visit (HOSPITAL_COMMUNITY)
Admission: RE | Admit: 2012-01-31 | Discharge: 2012-01-31 | Disposition: A | Discharge status: Home / Self Care | Payer: Medicare Other | Source: Ambulatory Visit | Attending: Vascular Surgery | Admitting: Vascular Surgery

## 2012-01-31 DIAGNOSIS — I739 Peripheral vascular disease, unspecified: Secondary | ICD-10-CM

## 2012-01-31 DIAGNOSIS — I70209 Unspecified atherosclerosis of native arteries of extremities, unspecified extremity: Secondary | ICD-10-CM | POA: Insufficient documentation

## 2012-01-31 HISTORY — PX: ABDOMINAL ANGIOGRAM: SHX5499

## 2012-01-31 LAB — POCT I-STAT, CHEM 8
BUN: 13 mg/dL (ref 6–23)
Chloride: 99 mEq/L (ref 96–112)
Creatinine, Ser: 1.2 mg/dL — ABNORMAL HIGH (ref 0.50–1.10)
Potassium: 3.3 mEq/L — ABNORMAL LOW (ref 3.5–5.1)
Sodium: 141 mEq/L (ref 135–145)
TCO2: 32 mmol/L (ref 0–100)

## 2012-01-31 SURGERY — ABDOMINAL ANGIOGRAM
Anesthesia: LOCAL

## 2012-01-31 MED ORDER — LIDOCAINE HCL (PF) 1 % IJ SOLN
INTRAMUSCULAR | Status: AC
  Filled 2012-01-31: qty 30

## 2012-01-31 MED ORDER — SODIUM CHLORIDE 0.9 % IV SOLN
INTRAVENOUS | Status: DC
  Administered 2012-01-31: 10:00:00 via INTRAVENOUS

## 2012-01-31 MED ORDER — HEPARIN (PORCINE) IN NACL 2-0.9 UNIT/ML-% IJ SOLN
INTRAMUSCULAR | Status: AC
  Filled 2012-01-31: qty 1000

## 2012-01-31 MED ORDER — FENTANYL CITRATE 0.05 MG/ML IJ SOLN
INTRAMUSCULAR | Status: AC
  Filled 2012-01-31: qty 2

## 2012-01-31 NOTE — H&P (View-Only) (Signed)
[facility] [place] HISTORY AND PHYSICAL   Referring Physician: Dr. Hart Rochester  History of Present Illness: This 71 year old female is a patient of Dr. Hart Rochester who underwent aortobifemoral bypass graft in February of 2012.  She returns to clinic today for lower extremity duplex graft scan as well as ABIs. There has been some progression of disease on the left, however her complaints are of rest pain mostly radiating from the foot upward to the groin at night. There is some occasional "cramp" in the right leg as well.  No past medical history on file.  ROS: [x]  Positive   [ ]  Denies    General: [ ]  Weight loss, [ ]  Fever, [ ]  chills Neurologic: [ ]  Dizziness, [ ]  Blackouts, [ ]  Seizure [ ]  Stroke, [ ]  "Mini stroke", [ ]  Slurred speech, [ ]  Temporary blindness; [ ]  weakness in arms or legs, [ ]  Hoarseness Cardiac: [ ]  Chest pain/pressure, [ ]  Shortness of breath at rest [ ]  Shortness of breath with exertion, [ ]  Atrial fibrillation or irregular heartbeat Vascular: [ ]  Pain in legs with walking, [x ] Pain in legs at rest, [x ] Pain in legs at night,  [ ]  Non-healing ulcer, [ ]  Blood clot in vein/DVT,   Pulmonary: [ ]  Home oxygen, [ ]  Productive cough, [ ]  Coughing up blood, [ ]  Asthma,  [ ]  Wheezing Musculoskeletal:  [ ]  Arthritis, [ ]  Low back pain, [ ]  Joint pain Hematologic: [ ]  Easy Bruising, [ ]  Anemia; [ ]  Hepatitis Gastrointestinal: [ ]  Blood in stool, [ ]  Gastroesophageal Reflux/heartburn, [ ]  Trouble swallowing Urinary: [ ]  chronic Kidney disease, [ ]  on HD - [ ]  MWF or [ ]  TTHS, [ ]  Burning with urination, [ ]  Difficulty urinating Skin: [ ]  Rashes, [ ]  Wounds Psychological: [ ]  Anxiety, [ ]  Depression   Social History History  Substance Use Topics  . Smoking status: Not on file  . Smokeless tobacco: Not on file  . Alcohol Use: Not on file    Family History No family history on file.  Not on File  No current outpatient prescriptions on file.     Physical Examination Blood pressure 154/75, O2 sat 97% on room air, pulse 57, respirations 16. 141 pounds 52 inches tall   General:  WDWN in NAD Gait: Normal HEENT: WNL Eyes: Pupils equal Pulmonary: normal non-labored breathing , without Rales, rhonchi,  wheezing Cardiac: RRR, without  Murmurs, rubs or gallops; No carotid bruits Abdomen: soft, NT, no masses Skin: no rashes, ulcers noted Vascular Exam/Pulses: Radial pulses 2+ bilaterally, PT and DP on the right 1+ PT and DP on the left and nonpalpable.  Extremities without ischemic changes, no Gangrene , no cellulitis; no open wounds;  Musculoskeletal: no muscle wasting or atrophy  Neurologic: A&O X 3; Appropriate Affect ; SENSATION: normal; MOTOR FUNCTION:  moving all extremities equally. Speech is fluent/normal  Non-Invasive Vascular Imaging: Right PT 142, DP 125. Left PT 102, DP 102. ABI is 1.01 on the right, 0.72 on the left. Lower extremity deep Plex bypass graft scan shows a velocity increase of 4 61 cm/s just distal to the distal anastomosis.  ASSESSMENT/PLAN: I discussed the above with Dr. Hart Rochester then with the patient and her husband. Dr. Hart Rochester feels at this time the best move going forward would be a lower extremity arteriogram with bilateral runoff which will be scheduled for the next 2-3 weeks and then followup with Dr. Hart Rochester in his clinic.  The patient and her husband are in agreement with this their questions were encouraged and answered  Lauree Chandler ANP  Clinic M.D.: Hart Rochester

## 2012-01-31 NOTE — Op Note (Signed)
OPERATIVE NOTE   PROCEDURE: 1.  Right common femoral artery cannulation under ultrasound guidance 2.  Aortogram 3.  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  POST-OPERATIVE DIAGNOSIS: same as above   SURGEON: Leonides Sake, MD  ANESTHESIA: conscious sedation  ESTIMATED BLOOD LOSS: 30 cc  CONTRAST: 95 cc  FINDING(S):  Aorta: patent aortobifemoral bypass graft  Inferior and Superior mesenteric artery: not visualized Celiac artery: patent  Right Left  RA >50% stenosis, possible dissection flap in renal artery Patent  CIA Not visualized Not visualized  EIA Native retrograde fills Not visualized  IIA Not visualized Not visualized  CFA Patent, <30% distal stenosis Patent, >75% distal stenosis, distal to left aortobifemoral limb anastomosis  SFA Patent Patent  PFA Patent Patent  Pop Patent Patent  Trif Patent Patent  AT Patent Patent  Pero Patent proximally Patent proximally  PT Patent Patent   SPECIMEN(S):  none  INDICATIONS:   [name] is a 71 y.o. female who presents with distal left common femoral artery stenosis.  The patient presents for: Aortogram, bilateral leg runoff.  I discussed with the patient the nature of angiographic procedures, especially the limited patencies of any endovascular intervention.  The patient is aware of that the risks of an angiographic procedure include but are not limited to: bleeding, infection, access site complications, renal failure, embolization, rupture of vessel, dissection, possible need for emergent surgical intervention, possible need for surgical procedures to treat the patient's pathology, and stroke and death.  The patient is aware of the risks and agrees to proceed.  DESCRIPTION: After full informed consent was obtained from the patient, the patient was brought back to the angiography suite.  The patient was placed supine upon the angiography table and  connected to monitoring equipment.  The patient was then given conscious sedation, the amounts of which are documented in the patient's chart.  The patient was prepped and drape in the standard fashion for an angiographic procedure.  At this point, attention was turned to the right groin.  Under ultrasound guidance, the right aortobifemoral limb was cannulated with a 18 gauge needle.  The [facility] wire was passed up into the aorta.  The needle was exchanged for a 4-Fr end-hole which would not pass up into the graft.  I then tried a microsheath which did pass into the graft.  The wire was exchanged for an Amplatz wire.  The microsheath was exchanged for a 6-Fr dilator.  Finally the 5-Fr sheath advanced over the wire into the right aortobifemoral limb.  The dilator was then removed.  The Omniflush catheter was then loaded over the wire up to the level of L1.  The catheter was connected to the power injector circuit.  After de-airring and de-clotting the circuit, a power injector aortogram was completed.  The findings are listed as above.  The catheter was pulled to just proximal to the graft bifurcation.  An automated bilateral leg runoff was completed.  Note, the table movement inadvertently resulted in the catheter being pulled back slightly into the right common iliac artery, accounting for the asymmetric flow pattern.  A Benson wire was replaced into the catheter to straighten out the crook of the catheter.  The catheter and wire were removed.  The plan is to pull the sheath in the holding area.  COMPLICATIONS: none  CONDITION: stable   Leonides Sake, MD [facility] [place] Office: [phone] Pager: [phone]  01/31/2012, 3:40 PM

## 2012-01-31 NOTE — Discharge Instructions (Signed)
Groin Site Care Refer to this sheet in the next few weeks. These instructions provide you with information on caring for yourself after your procedure. Your caregiver may also give you more specific instructions. Your treatment has been planned according to current medical practices, but problems sometimes occur. Call your caregiver if you have any problems or questions after your procedure. HOME CARE INSTRUCTIONS  You may shower 24 hours after the procedure. Remove the bandage (dressing) and gently wash the site with plain soap and water. Gently pat the site dry.   Do not apply powder or lotion to the site.   Do not sit in a bathtub, swimming pool, or whirlpool for 5 to 7 days.   No bending, squatting, or lifting anything over 10 pounds (4.5 kg) as directed by your caregiver.   Inspect the site at least twice daily.   Do not drive home if you are discharged the same day of the procedure. Have someone else drive you.   You may drive 24 hours after the procedure unless otherwise instructed by your caregiver.  What to expect:  Any bruising will usually fade within 1 to 2 weeks.   Blood that collects in the tissue (hematoma) may be painful to the touch. It should usually decrease in size and tenderness within 1 to 2 weeks.  SEEK IMMEDIATE MEDICAL CARE IF:  You have unusual pain at the groin site or down the affected leg.   You have redness, warmth, swelling, or pain at the groin site.   You have drainage (other than a small amount of blood on the dressing).   You have chills.   You have a fever or persistent symptoms for more than 72 hours.   You have a fever and your symptoms suddenly get worse.   Your leg becomes pale, cool, tingly, or numb.   You have heavy bleeding from the site. Hold pressure on the site.  Document Released: 11/10/2010 Document Revised: 09/27/2011 Document Reviewed: 11/10/2010 [facility] Patient Information 2012 [place], [place].

## 2012-01-31 NOTE — Interval H&P Note (Signed)
[facility] [place]  History and Physical Update  The patient was interviewed and re-examined.  The patient's previous History and Physical has been reviewed and is unchanged.  There is no change in the plan of care.  Leonides Sake, MD [facility] [place] Office: [phone] Pager: [phone]  01/31/2012, 10:19 AM

## 2012-02-25 ENCOUNTER — Encounter: Payer: Self-pay | Admitting: Vascular Surgery

## 2012-02-26 ENCOUNTER — Ambulatory Visit (INDEPENDENT_AMBULATORY_CARE_PROVIDER_SITE_OTHER): Payer: Medicare Other | Admitting: Vascular Surgery

## 2012-02-26 ENCOUNTER — Other Ambulatory Visit: Payer: Self-pay | Admitting: *Deleted

## 2012-02-26 ENCOUNTER — Encounter: Payer: Self-pay | Admitting: Vascular Surgery

## 2012-02-26 ENCOUNTER — Encounter (HOSPITAL_COMMUNITY): Payer: Self-pay | Admitting: Pharmacy Technician

## 2012-02-26 ENCOUNTER — Encounter: Payer: Self-pay | Admitting: *Deleted

## 2012-02-26 VITALS — BP 154/73 | HR 60 | Resp 16 | Ht 62.0 in | Wt 137.0 lb

## 2012-02-26 DIAGNOSIS — I745 Embolism and thrombosis of iliac artery: Secondary | ICD-10-CM

## 2012-02-26 DIAGNOSIS — I714 Abdominal aortic aneurysm, without rupture, unspecified: Secondary | ICD-10-CM

## 2012-02-26 DIAGNOSIS — I739 Peripheral vascular disease, unspecified: Secondary | ICD-10-CM | POA: Insufficient documentation

## 2012-02-26 HISTORY — DX: Embolism and thrombosis of iliac artery: I74.5

## 2012-02-26 HISTORY — DX: Abdominal aortic aneurysm, without rupture: I71.4

## 2012-02-26 HISTORY — DX: Abdominal aortic aneurysm, without rupture, unspecified: I71.40

## 2012-02-26 HISTORY — DX: Peripheral vascular disease, unspecified: I73.9

## 2012-02-26 NOTE — Progress Notes (Signed)
Subjective:     Patient ID: [name], female   DOB: 05/16/1941, 70 y.o.   MRN: [id number]  HPI this 70-year-old female returns today to discuss the recent angiographic findings. She has left leg claudication symptoms after walking about 50 yards which began in the left hip area and extending into the left calf. She also has some cramping sensations in her right leg at night. Angiograms performed by Dr. Chien a few weeks ago reveals a severe stenosis at the tip of the left limb of the aortobifemoral bypass graft on the left common femoral artery. This appears to be approximately 80% stenotic.  Past Medical History  Diagnosis Date  . Arthritis   . Hip pain   . Peripheral vascular disease   . Cancer     BREAST    History  Substance Use Topics  . Smoking status: Current Everyday Smoker -- 1.0 packs/day for 50 years    Types: Cigarettes  . Smokeless tobacco: Never Used  . Alcohol Use: Yes    Family History  Problem Relation Age of Onset  . Heart disease Mother   . Cancer Father     LUNG  . Cancer Sister     BONE    Allergies  Allergen Reactions  . Penicillins     Current outpatient prescriptions:anastrozole (ARIMIDEX) 1 MG tablet, Take 1 mg by mouth daily., Disp: , Rfl: ;  aspirin EC 81 MG tablet, Take 162 mg by mouth daily., Disp: , Rfl: ;  atenolol (TENORMIN) 100 MG tablet, Take 100 mg by mouth daily., Disp: , Rfl: ;  Calcium-Magnesium-Vitamin D (CALCIUM MAGNESIUM PO), Take 1 tablet by mouth 2 (two) times daily., Disp: , Rfl: ;  fenofibrate 160 MG tablet, Take 160 mg by mouth daily., Disp: , Rfl:  fish oil-omega-3 fatty acids 1000 MG capsule, Take 1 g by mouth 2 (two) times daily., Disp: , Rfl: ;  levothyroxine (SYNTHROID, LEVOTHROID) 125 MCG tablet, Take 125 mcg by mouth daily., Disp: , Rfl: ;  lisinopril (PRINIVIL,ZESTRIL) 40 MG tablet, Take 40 mg by mouth at bedtime., Disp: , Rfl: ;  losartan-hydrochlorothiazide (HYZAAR) 100-25 MG per tablet, Take 1 tablet by mouth daily.,  Disp: , Rfl:  omeprazole (PRILOSEC) 20 MG capsule, Take 20 mg by mouth 2 (two) times daily., Disp: , Rfl: ;  OVER THE COUNTER MEDICATION, Take 1 tablet by mouth daily as needed. OTC Leg cramp tablet  As needed for leg cramps, Disp: , Rfl: ;  PARoxetine (PAXIL) 40 MG tablet, Take 40 mg by mouth every morning., Disp: , Rfl: ;  rOPINIRole (REQUIP) 4 MG tablet, Take 4 mg by mouth at bedtime., Disp: , Rfl:  losartan (COZAAR) 100 MG tablet, Take 100 mg by mouth at bedtime., Disp: , Rfl:   BP 154/73  Pulse 60  Resp 16  Ht 5' 2" (1.575 m)  Wt 137 lb (62.143 kg)  BMI 25.06 kg/m2  Body mass index is 25.06 kg/(m^2).          Review of Systems denies chest pain, dyspnea on exertion, PND, orthopnea. All other systems are negative and complete review of     Objective:   Physical Exam blood pressure 154/73 heart rate 60 respirations 16 Gen.-alert and oriented x3 in no apparent distress HEENT normal for age Lungs no rhonchi or wheezing Cardiovascular regular rhythm no murmurs carotid pulses 3+ palpable no bruits audible Abdomen soft nontender no palpable masses Musculoskeletal free of  major deformities Skin clear -no rashes Neurologic normal Lower   extremities 3+ femoral pulse on the right 2+ on the left. Right leg with 2+ popliteal 2+ dorsalis pedis pulse. Left leg with 1+ popliteal 1+ dorsalis pedis pulse.  Recent ABIs were 0.68 on left and 0.86 on the right      Assessment:     Stenosis left common femoral artery left limb aortobifemoral bypass graft with limiting claudication    Plan:     Plan left femoral endarterectomy and/or revision on Friday, May 10       

## 2012-02-28 ENCOUNTER — Encounter (HOSPITAL_COMMUNITY)
Admission: RE | Admit: 2012-02-28 | Discharge: 2012-02-28 | Disposition: A | Discharge status: Home / Self Care | Payer: Medicare Other | Source: Ambulatory Visit | Attending: Vascular Surgery | Admitting: Vascular Surgery

## 2012-02-28 ENCOUNTER — Encounter (HOSPITAL_COMMUNITY): Payer: Self-pay

## 2012-02-28 ENCOUNTER — Ambulatory Visit (HOSPITAL_COMMUNITY)
Admission: RE | Admit: 2012-02-28 | Discharge: 2012-02-28 | Disposition: A | Discharge status: Home / Self Care | Payer: Medicare Other | Source: Ambulatory Visit | Attending: Anesthesiology | Admitting: Anesthesiology

## 2012-02-28 HISTORY — DX: Depression, unspecified: F32.A

## 2012-02-28 HISTORY — DX: Major depressive disorder, single episode, unspecified: F32.9

## 2012-02-28 HISTORY — DX: Encounter for other specified aftercare: Z51.89

## 2012-02-28 HISTORY — DX: Gastro-esophageal reflux disease without esophagitis: K21.9

## 2012-02-28 HISTORY — DX: Chronic obstructive pulmonary disease, unspecified: J44.9

## 2012-02-28 HISTORY — DX: Sleep apnea, unspecified: G47.30

## 2012-02-28 HISTORY — DX: Essential (primary) hypertension: I10

## 2012-02-28 HISTORY — DX: Hypothyroidism, unspecified: E03.9

## 2012-02-28 LAB — URINALYSIS, ROUTINE W REFLEX MICROSCOPIC
Hgb urine dipstick: NEGATIVE
Ketones, ur: NEGATIVE mg/dL
Protein, ur: NEGATIVE mg/dL
Urobilinogen, UA: 0.2 mg/dL (ref 0.0–1.0)

## 2012-02-28 LAB — CBC
HCT: 41.1 % (ref 36.0–46.0)
MCHC: 35 g/dL (ref 30.0–36.0)
MCV: 88 fL (ref 78.0–100.0)
RDW: 13 % (ref 11.5–15.5)

## 2012-02-28 LAB — COMPREHENSIVE METABOLIC PANEL
Albumin: 3.7 g/dL (ref 3.5–5.2)
BUN: 13 mg/dL (ref 6–23)
Chloride: 90 mEq/L — ABNORMAL LOW (ref 96–112)
Creatinine, Ser: 0.96 mg/dL (ref 0.50–1.10)
Total Bilirubin: 0.3 mg/dL (ref 0.3–1.2)
Total Protein: 6.6 g/dL (ref 6.0–8.3)

## 2012-02-28 LAB — TYPE AND SCREEN
ABO/RH(D): O POS
Antibody Screen: NEGATIVE

## 2012-02-28 LAB — SURGICAL PCR SCREEN: MRSA, PCR: NEGATIVE

## 2012-02-28 LAB — URINE MICROSCOPIC-ADD ON

## 2012-02-28 MED ORDER — VANCOMYCIN HCL IN DEXTROSE 1-5 GM/200ML-% IV SOLN
1000.0000 mg | INTRAVENOUS | Status: AC
  Administered 2012-02-29: 1000 mg via INTRAVENOUS
  Filled 2012-02-28: qty 200

## 2012-02-28 NOTE — Pre-Procedure Instructions (Signed)
[address] Ket[address] Rubiano  02/28/2012   Your procedure is scheduled on:  Friday, May 10  Report to [facility] at *0530** AM.  Call this number if you have problems the morning of surgery: [phone]   Remember:   Do not eat food:After Midnight.  May have clear liquids: up to 4 Hours before arrival.  Clear liquids include soda, tea, black coffee, apple or grape juice, broth.  Take these medicines the morning of surgery with A SIP OF WATER: *Take 30 units of insulin tonight with bedtime snack,Arimidex,Atenolol,Synthroid,Omeprazole, Paxil**   Do not wear jewelry, make-up or nail polish.  Do not wear lotions, powders, or perfumes. You may wear deodorant.  Do not shave 48 hours prior to surgery.  Do not bring valuables to the hospital.  Contacts, dentures or bridgework may not be worn into surgery.  Leave suitcase in the car. After surgery it may be brought to your room.  For patients admitted to the hospital, checkout time is 11:00 AM the day of discharge.   Patients discharged the day of surgery will not be allowed to drive home.  Name and phone number of your driver: *n/a**  Special Instructions: CHG Shower Use Special Wash: 1/2 bottle night before surgery and 1/2 bottle morning of surgery.   Please read over the following fact sheets that you were given: Pain Booklet, Coughing and Deep Breathing, Blood Transfusion Information, MRSA Information and Surgical Site Infection Prevention

## 2012-02-28 NOTE — Progress Notes (Signed)
Dr Noreene Larsson notified of Glucose of 631.  Lab notes report that Dr Myra Gianotti was notified.

## 2012-02-29 ENCOUNTER — Encounter (HOSPITAL_COMMUNITY): Payer: Self-pay | Admitting: Certified Registered"

## 2012-02-29 ENCOUNTER — Ambulatory Visit (HOSPITAL_COMMUNITY): Payer: Medicare Other | Admitting: Certified Registered"

## 2012-02-29 ENCOUNTER — Inpatient Hospital Stay (HOSPITAL_COMMUNITY)
Admission: RE | Admit: 2012-02-29 | Discharge: 2012-03-01 | DRG: 254 | Disposition: A | Discharge status: Home / Self Care | Payer: Medicare Other | Source: Ambulatory Visit | Attending: Vascular Surgery | Admitting: Vascular Surgery

## 2012-02-29 ENCOUNTER — Encounter (HOSPITAL_COMMUNITY)
Admission: RE | Disposition: A | Discharge status: Home / Self Care | Payer: Self-pay | Source: Ambulatory Visit | Attending: Vascular Surgery

## 2012-02-29 DIAGNOSIS — M129 Arthropathy, unspecified: Secondary | ICD-10-CM | POA: Diagnosis present

## 2012-02-29 DIAGNOSIS — I10 Essential (primary) hypertension: Secondary | ICD-10-CM | POA: Diagnosis present

## 2012-02-29 DIAGNOSIS — I70219 Atherosclerosis of native arteries of extremities with intermittent claudication, unspecified extremity: Principal | ICD-10-CM | POA: Diagnosis present

## 2012-02-29 DIAGNOSIS — I739 Peripheral vascular disease, unspecified: Secondary | ICD-10-CM

## 2012-02-29 DIAGNOSIS — Z79899 Other long term (current) drug therapy: Secondary | ICD-10-CM

## 2012-02-29 DIAGNOSIS — Z853 Personal history of malignant neoplasm of breast: Secondary | ICD-10-CM

## 2012-02-29 DIAGNOSIS — K219 Gastro-esophageal reflux disease without esophagitis: Secondary | ICD-10-CM | POA: Diagnosis present

## 2012-02-29 DIAGNOSIS — E119 Type 2 diabetes mellitus without complications: Secondary | ICD-10-CM | POA: Diagnosis present

## 2012-02-29 DIAGNOSIS — F3289 Other specified depressive episodes: Secondary | ICD-10-CM | POA: Diagnosis present

## 2012-02-29 DIAGNOSIS — Z880 Allergy status to penicillin: Secondary | ICD-10-CM

## 2012-02-29 DIAGNOSIS — Z01812 Encounter for preprocedural laboratory examination: Secondary | ICD-10-CM

## 2012-02-29 DIAGNOSIS — I743 Embolism and thrombosis of arteries of the lower extremities: Secondary | ICD-10-CM

## 2012-02-29 DIAGNOSIS — J4489 Other specified chronic obstructive pulmonary disease: Secondary | ICD-10-CM | POA: Diagnosis present

## 2012-02-29 DIAGNOSIS — F329 Major depressive disorder, single episode, unspecified: Secondary | ICD-10-CM | POA: Diagnosis present

## 2012-02-29 DIAGNOSIS — Z01810 Encounter for preprocedural cardiovascular examination: Secondary | ICD-10-CM

## 2012-02-29 DIAGNOSIS — J449 Chronic obstructive pulmonary disease, unspecified: Secondary | ICD-10-CM | POA: Diagnosis present

## 2012-02-29 DIAGNOSIS — F17200 Nicotine dependence, unspecified, uncomplicated: Secondary | ICD-10-CM | POA: Diagnosis present

## 2012-02-29 DIAGNOSIS — E039 Hypothyroidism, unspecified: Secondary | ICD-10-CM | POA: Diagnosis present

## 2012-02-29 DIAGNOSIS — Z794 Long term (current) use of insulin: Secondary | ICD-10-CM

## 2012-02-29 LAB — GLUCOSE, CAPILLARY
Glucose-Capillary: 358 mg/dL — ABNORMAL HIGH (ref 70–99)
Glucose-Capillary: 181 mg/dL — ABNORMAL HIGH (ref 70–99)
Glucose-Capillary: 91 mg/dL (ref 70–99)

## 2012-02-29 LAB — HEMOGLOBIN A1C
Hgb A1c MFr Bld: 12.2 % — ABNORMAL HIGH
Mean Plasma Glucose: 303 mg/dL — ABNORMAL HIGH

## 2012-02-29 SURGERY — ENDARTERECTOMY, FEMORAL
Anesthesia: Monitor Anesthesia Care | Site: Leg Upper | Laterality: Left | Wound class: Clean

## 2012-02-29 MED ORDER — SODIUM CHLORIDE 0.9 % IV SOLN
INTRAVENOUS | Status: DC | PRN
  Administered 2012-02-29 (×2): via INTRAVENOUS

## 2012-02-29 MED ORDER — FENOFIBRATE 160 MG PO TABS
160.0000 mg | ORAL_TABLET | Freq: Every day | ORAL | Status: DC
  Administered 2012-02-29 – 2012-03-01 (×2): 160 mg via ORAL
  Filled 2012-02-29 (×3): qty 1

## 2012-02-29 MED ORDER — ROCURONIUM BROMIDE 100 MG/10ML IV SOLN
INTRAVENOUS | Status: DC | PRN
  Administered 2012-02-29: 40 mg via INTRAVENOUS

## 2012-02-29 MED ORDER — LOSARTAN POTASSIUM 50 MG PO TABS
100.0000 mg | ORAL_TABLET | Freq: Every day | ORAL | Status: DC
  Filled 2012-02-29: qty 2

## 2012-02-29 MED ORDER — INSULIN ASPART 100 UNIT/ML ~~LOC~~ SOLN
0.0000 [IU] | Freq: Three times a day (TID) | SUBCUTANEOUS | Status: DC
  Administered 2012-03-01: 7 [IU] via SUBCUTANEOUS

## 2012-02-29 MED ORDER — LEVOTHYROXINE SODIUM 125 MCG PO TABS
125.0000 ug | ORAL_TABLET | Freq: Every day | ORAL | Status: DC
  Administered 2012-03-01: 125 ug via ORAL
  Filled 2012-02-29 (×2): qty 1

## 2012-02-29 MED ORDER — INSULIN GLARGINE 100 UNIT/ML ~~LOC~~ SOLN
60.0000 [IU] | Freq: Two times a day (BID) | SUBCUTANEOUS | Status: DC
  Administered 2012-02-29 – 2012-03-01 (×2): 60 [IU] via SUBCUTANEOUS

## 2012-02-29 MED ORDER — HYDROCHLOROTHIAZIDE 25 MG PO TABS
25.0000 mg | ORAL_TABLET | Freq: Every day | ORAL | Status: DC
  Administered 2012-02-29 – 2012-03-01 (×2): 25 mg via ORAL
  Filled 2012-02-29 (×3): qty 1

## 2012-02-29 MED ORDER — PROPOFOL 10 MG/ML IV EMUL
INTRAVENOUS | Status: DC | PRN
  Administered 2012-02-29: 150 mg via INTRAVENOUS

## 2012-02-29 MED ORDER — INSULIN ASPART 100 UNIT/ML ~~LOC~~ SOLN
20.0000 [IU] | Freq: Once | SUBCUTANEOUS | Status: AC
  Administered 2012-02-29: 20 [IU] via SUBCUTANEOUS

## 2012-02-29 MED ORDER — PAROXETINE HCL 20 MG PO TABS
40.0000 mg | ORAL_TABLET | Freq: Every day | ORAL | Status: DC
  Administered 2012-03-01: 40 mg via ORAL
  Filled 2012-02-29: qty 2

## 2012-02-29 MED ORDER — DEXTROSE 5 % IV SOLN: 1.5000 g | Freq: Two times a day (BID) | INTRAVENOUS | Status: DC

## 2012-02-29 MED ORDER — DOPAMINE-DEXTROSE 3.2-5 MG/ML-% IV SOLN: 3.0000 ug/kg/min | INTRAVENOUS | Status: DC

## 2012-02-29 MED ORDER — HEPARIN SODIUM (PORCINE) 1000 UNIT/ML IJ SOLN
INTRAMUSCULAR | Status: DC | PRN
  Administered 2012-02-29: 6000 [IU] via INTRAVENOUS

## 2012-02-29 MED ORDER — HYDROMORPHONE HCL PF 1 MG/ML IJ SOLN: 0.2500 mg | INTRAMUSCULAR | Status: DC | PRN

## 2012-02-29 MED ORDER — SODIUM CHLORIDE 0.9 % IV SOLN
INTRAVENOUS | Status: DC
  Administered 2012-02-29 (×2): via INTRAVENOUS

## 2012-02-29 MED ORDER — MORPHINE SULFATE 2 MG/ML IJ SOLN
INTRAMUSCULAR | Status: AC
  Administered 2012-02-29: 2 mg via INTRAVENOUS
  Filled 2012-02-29: qty 1

## 2012-02-29 MED ORDER — PAROXETINE HCL 20 MG PO TABS
40.0000 mg | ORAL_TABLET | ORAL | Status: DC
  Filled 2012-02-29: qty 2

## 2012-02-29 MED ORDER — PANTOPRAZOLE SODIUM 40 MG PO TBEC: 40.0000 mg | DELAYED_RELEASE_TABLET | Freq: Every day | ORAL | Status: DC

## 2012-02-29 MED ORDER — OXYCODONE HCL 5 MG PO TABS: 5.0000 mg | ORAL_TABLET | ORAL | Status: DC | PRN

## 2012-02-29 MED ORDER — PROTAMINE SULFATE 10 MG/ML IV SOLN
INTRAVENOUS | Status: DC | PRN
  Administered 2012-02-29: 50 mg via INTRAVENOUS

## 2012-02-29 MED ORDER — ACETAMINOPHEN 650 MG RE SUPP: 325.0000 mg | RECTAL | Status: DC | PRN

## 2012-02-29 MED ORDER — ACETAMINOPHEN 325 MG PO TABS: 325.0000 mg | ORAL_TABLET | ORAL | Status: DC | PRN

## 2012-02-29 MED ORDER — ASPIRIN EC 81 MG PO TBEC
162.0000 mg | DELAYED_RELEASE_TABLET | Freq: Every day | ORAL | Status: DC
  Administered 2012-02-29 – 2012-03-01 (×2): 162 mg via ORAL
  Filled 2012-02-29 (×2): qty 2

## 2012-02-29 MED ORDER — LABETALOL HCL 5 MG/ML IV SOLN: 10.0000 mg | INTRAVENOUS | Status: DC | PRN

## 2012-02-29 MED ORDER — GLYCOPYRROLATE 0.2 MG/ML IJ SOLN
INTRAMUSCULAR | Status: DC | PRN
  Administered 2012-02-29: 0.2 mg via INTRAVENOUS

## 2012-02-29 MED ORDER — METOPROLOL TARTRATE 1 MG/ML IV SOLN: 2.0000 mg | INTRAVENOUS | Status: DC | PRN

## 2012-02-29 MED ORDER — INSULIN GLARGINE 100 UNIT/ML ~~LOC~~ SOLN: 60.0000 [IU] | Freq: Two times a day (BID) | SUBCUTANEOUS | Status: DC

## 2012-02-29 MED ORDER — VANCOMYCIN HCL IN DEXTROSE 1-5 GM/200ML-% IV SOLN
1000.0000 mg | Freq: Once | INTRAVENOUS | Status: AC
  Administered 2012-02-29: 1000 mg via INTRAVENOUS
  Filled 2012-02-29: qty 200

## 2012-02-29 MED ORDER — SODIUM CHLORIDE 0.9 % IV SOLN: 500.0000 mL | Freq: Once | INTRAVENOUS | Status: AC | PRN

## 2012-02-29 MED ORDER — POTASSIUM CHLORIDE CRYS ER 20 MEQ PO TBCR: 20.0000 meq | EXTENDED_RELEASE_TABLET | Freq: Once | ORAL | Status: AC | PRN

## 2012-02-29 MED ORDER — SODIUM CHLORIDE 0.9 % IV SOLN: INTRAVENOUS | Status: DC

## 2012-02-29 MED ORDER — SENNOSIDES-DOCUSATE SODIUM 8.6-50 MG PO TABS
1.0000 | ORAL_TABLET | Freq: Every evening | ORAL | Status: DC | PRN
  Filled 2012-02-29: qty 1

## 2012-02-29 MED ORDER — BISACODYL 5 MG PO TBEC: 5.0000 mg | DELAYED_RELEASE_TABLET | Freq: Every day | ORAL | Status: DC | PRN

## 2012-02-29 MED ORDER — ROPINIROLE HCL 1 MG PO TABS
4.0000 mg | ORAL_TABLET | Freq: Every day | ORAL | Status: DC
  Administered 2012-02-29: 4 mg via ORAL
  Filled 2012-02-29 (×2): qty 4

## 2012-02-29 MED ORDER — GUAIFENESIN-DM 100-10 MG/5ML PO SYRP: 15.0000 mL | ORAL_SOLUTION | ORAL | Status: DC | PRN

## 2012-02-29 MED ORDER — INSULIN ASPART 100 UNIT/ML ~~LOC~~ SOLN
SUBCUTANEOUS | Status: AC
  Administered 2012-02-29 (×2): 10 [IU] via SUBCUTANEOUS
  Filled 2012-02-29: qty 1

## 2012-02-29 MED ORDER — PHENYLEPHRINE HCL 10 MG/ML IJ SOLN
INTRAMUSCULAR | Status: DC | PRN
  Administered 2012-02-29 (×3): .08 ug via INTRAVENOUS

## 2012-02-29 MED ORDER — MORPHINE SULFATE 2 MG/ML IJ SOLN: 2.0000 mg | INTRAMUSCULAR | Status: DC | PRN

## 2012-02-29 MED ORDER — LISINOPRIL 40 MG PO TABS
40.0000 mg | ORAL_TABLET | Freq: Every day | ORAL | Status: DC
  Administered 2012-02-29: 40 mg via ORAL
  Filled 2012-02-29 (×2): qty 1

## 2012-02-29 MED ORDER — ANASTROZOLE 1 MG PO TABS
1.0000 mg | ORAL_TABLET | Freq: Every day | ORAL | Status: DC
  Administered 2012-02-29 – 2012-03-01 (×2): 1 mg via ORAL
  Filled 2012-02-29 (×2): qty 1

## 2012-02-29 MED ORDER — HYDRALAZINE HCL 20 MG/ML IJ SOLN: 10.0000 mg | INTRAMUSCULAR | Status: DC | PRN

## 2012-02-29 MED ORDER — PHENOL 1.4 % MT LIQD: 1.0000 | OROMUCOSAL | Status: DC | PRN

## 2012-02-29 MED ORDER — ONDANSETRON HCL 4 MG/2ML IJ SOLN
INTRAMUSCULAR | Status: DC | PRN
  Administered 2012-02-29: 4 mg via INTRAVENOUS

## 2012-02-29 MED ORDER — INSULIN ASPART 100 UNIT/ML ~~LOC~~ SOLN
10.0000 [IU] | Freq: Once | SUBCUTANEOUS | Status: DC
  Filled 2012-02-29: qty 3

## 2012-02-29 MED ORDER — NEOSTIGMINE METHYLSULFATE 1 MG/ML IJ SOLN
INTRAMUSCULAR | Status: DC | PRN
  Administered 2012-02-29: 1.5 mg via INTRAVENOUS

## 2012-02-29 MED ORDER — ONDANSETRON HCL 4 MG/2ML IJ SOLN: 4.0000 mg | Freq: Four times a day (QID) | INTRAMUSCULAR | Status: DC | PRN

## 2012-02-29 MED ORDER — ALUM & MAG HYDROXIDE-SIMETH 200-200-20 MG/5ML PO SUSP: 15.0000 mL | ORAL | Status: DC | PRN

## 2012-02-29 MED ORDER — LOSARTAN POTASSIUM 50 MG PO TABS
100.0000 mg | ORAL_TABLET | Freq: Every day | ORAL | Status: DC
  Administered 2012-02-29 – 2012-03-01 (×2): 100 mg via ORAL
  Filled 2012-02-29 (×3): qty 2

## 2012-02-29 MED ORDER — 0.9 % SODIUM CHLORIDE (POUR BTL) OPTIME
TOPICAL | Status: DC | PRN
  Administered 2012-02-29: 1000 mL

## 2012-02-29 MED ORDER — MAGNESIUM SULFATE 40 MG/ML IJ SOLN
2.0000 g | Freq: Once | INTRAMUSCULAR | Status: AC | PRN
  Filled 2012-02-29: qty 50

## 2012-02-29 MED ORDER — MORPHINE SULFATE 4 MG/ML IJ SOLN: 0.0500 mg/kg | INTRAMUSCULAR | Status: DC | PRN

## 2012-02-29 MED ORDER — ATENOLOL 100 MG PO TABS
100.0000 mg | ORAL_TABLET | Freq: Every day | ORAL | Status: DC
Start: 2012-03-01 — End: 2012-03-01
  Administered 2012-03-01: 100 mg via ORAL
  Filled 2012-02-29: qty 1

## 2012-02-29 MED ORDER — SODIUM CHLORIDE 0.9 % IV SOLN
INTRAVENOUS | Status: DC
  Administered 2012-02-29: 07:00:00 via INTRAVENOUS

## 2012-02-29 MED ORDER — DOCUSATE SODIUM 100 MG PO CAPS
100.0000 mg | ORAL_CAPSULE | Freq: Every day | ORAL | Status: DC
  Administered 2012-03-01: 100 mg via ORAL
  Filled 2012-02-29: qty 1

## 2012-02-29 MED ORDER — HEPARIN SODIUM (PORCINE) 5000 UNIT/ML IJ SOLN
INTRAMUSCULAR | Status: DC | PRN
  Administered 2012-02-29: 09:00:00

## 2012-02-29 MED ORDER — LOSARTAN POTASSIUM-HCTZ 100-25 MG PO TABS: 1.0000 | ORAL_TABLET | Freq: Every day | ORAL | Status: DC

## 2012-02-29 MED ORDER — FENTANYL CITRATE 0.05 MG/ML IJ SOLN
INTRAMUSCULAR | Status: DC | PRN
  Administered 2012-02-29: 25 ug via INTRAVENOUS
  Administered 2012-02-29: 75 ug via INTRAVENOUS
  Administered 2012-02-29: 25 ug via INTRAVENOUS

## 2012-02-29 SURGICAL SUPPLY — 49 items
ADH SKN CLS APL DERMABOND .7 (GAUZE/BANDAGES/DRESSINGS) ×1
BANDAGE ESMARK 6X9 LF (GAUZE/BANDAGES/DRESSINGS) IMPLANT
BNDG CMPR 9X6 STRL LF SNTH (GAUZE/BANDAGES/DRESSINGS)
BNDG ESMARK 6X9 LF (GAUZE/BANDAGES/DRESSINGS)
CANISTER SUCTION 2500CC (MISCELLANEOUS) ×2 IMPLANT
CLIP TI MEDIUM 24 (CLIP) ×2 IMPLANT
CLIP TI WIDE RED SMALL 24 (CLIP) ×2 IMPLANT
CLOTH BEACON ORANGE TIMEOUT ST (SAFETY) ×2 IMPLANT
COVER SURGICAL LIGHT HANDLE (MISCELLANEOUS) ×4 IMPLANT
DERMABOND ADVANCED (GAUZE/BANDAGES/DRESSINGS) ×1
DERMABOND ADVANCED .7 DNX12 (GAUZE/BANDAGES/DRESSINGS) ×1 IMPLANT
DRAIN SNY 10X20 3/4 PERF (WOUND CARE) IMPLANT
DRAPE WARM FLUID 44X44 (DRAPE) ×2 IMPLANT
DRSG COVADERM 4X8 (GAUZE/BANDAGES/DRESSINGS) IMPLANT
ELECT REM PT RETURN 9FT ADLT (ELECTROSURGICAL) ×2
ELECTRODE REM PT RTRN 9FT ADLT (ELECTROSURGICAL) ×1 IMPLANT
EVACUATOR SILICONE 100CC (DRAIN) IMPLANT
GLOVE BIO SURGEON STRL SZ 6.5 (GLOVE) ×1 IMPLANT
GLOVE BIOGEL PI IND STRL 6.5 (GLOVE) IMPLANT
GLOVE BIOGEL PI IND STRL 7.0 (GLOVE) IMPLANT
GLOVE BIOGEL PI IND STRL 7.5 (GLOVE) IMPLANT
GLOVE BIOGEL PI INDICATOR 6.5 (GLOVE) ×1
GLOVE BIOGEL PI INDICATOR 7.0 (GLOVE) ×1
GLOVE BIOGEL PI INDICATOR 7.5 (GLOVE) ×3
GLOVE ECLIPSE 6.5 STRL STRAW (GLOVE) ×1 IMPLANT
GLOVE SS BIOGEL STRL SZ 7 (GLOVE) ×1 IMPLANT
GLOVE SUPERSENSE BIOGEL SZ 7 (GLOVE) ×1
GOWN STRL NON-REIN LRG LVL3 (GOWN DISPOSABLE) ×6 IMPLANT
HEMASHIELD FINESSE CARDIO (Vascular Products) ×2 IMPLANT
KIT BASIN OR (CUSTOM PROCEDURE TRAY) ×2 IMPLANT
KIT ROOM TURNOVER OR (KITS) ×2 IMPLANT
NS IRRIG 1000ML POUR BTL (IV SOLUTION) ×4 IMPLANT
PACK PERIPHERAL VASCULAR (CUSTOM PROCEDURE TRAY) ×2 IMPLANT
PAD ARMBOARD 7.5X6 YLW CONV (MISCELLANEOUS) ×4 IMPLANT
PADDING CAST COTTON 6X4 STRL (CAST SUPPLIES) IMPLANT
PATCH HEMASHIELD FINESS CARDIO (Vascular Products) IMPLANT
STAPLER VISISTAT 35W (STAPLE) IMPLANT
SUT PROLENE 5 0 C 1 24 (SUTURE) ×2 IMPLANT
SUT PROLENE 6 0 BV (SUTURE) ×5 IMPLANT
SUT PROLENE 6 0 C 1 30 (SUTURE) ×1 IMPLANT
SUT VIC AB 2-0 CTX 36 (SUTURE) ×2 IMPLANT
SUT VIC AB 3-0 SH 27 (SUTURE) ×2
SUT VIC AB 3-0 SH 27X BRD (SUTURE) ×1 IMPLANT
SYR TB 1ML LUER SLIP (SYRINGE) ×2 IMPLANT
TOWEL OR 17X24 6PK STRL BLUE (TOWEL DISPOSABLE) ×4 IMPLANT
TOWEL OR 17X26 10 PK STRL BLUE (TOWEL DISPOSABLE) ×2 IMPLANT
TRAY FOLEY CATH 14FRSI W/METER (CATHETERS) ×2 IMPLANT
UNDERPAD 30X30 INCONTINENT (UNDERPADS AND DIAPERS) ×2 IMPLANT
WATER STERILE IRR 1000ML POUR (IV SOLUTION) ×2 IMPLANT

## 2012-02-29 NOTE — Progress Notes (Signed)
Pt arrived from PACU, SR/SB, VSS, neuro intact, groin site assessed.

## 2012-02-29 NOTE — Op Note (Signed)
OPERATIVE REPORT  Date of Surgery: 02/29/2012  Surgeon: [name], MD  Assistant: Lianne Cure PA  Pre-op Diagnosis: Left femoral artery stenosis with claudication  Post-op Diagnosis: Left femoral artery stenosis with claudication  Procedure: Procedure(s): Left common femoral, superficial femoral, and profunda femoris endarterectomy with Dacron patch angioplasty  Anesthesia: General  EBL: 150 cc  Complications: None  Procedure Details: The patient was taken to the operating room placed in the supine position at which time satisfactory general endotracheal anesthesia was measured. Left inguinal area was prepped with Betadine scrub and solution draped in routine sterile manner. Incision made through the previous scar carried down through subcutaneous tissue. The left limb of aortobifemoral bypass graft was dissected free for proximal control at the inguinal ligament. The common femoral superficial femoral and profunda femoris arteries were also dissected free. There was a diffuse calcific plaque beginning in the distal common femoral artery extending down the superficial femoral artery for a distance of about 5-6 cm. There was also plaque down the origin of the profunda femoris. The patient was heparinized. Vessels were occluded with vascular clamps. Longitudinal opening was made in the hood of the Dacron and the left limb of the common femoral artery. This extended down through the distal common femoral artery and down the superficial femoral artery for a distance of 5-6 cm. There was a tightly stenotic plaque involving the distal common femoral artery. An endarterectomy was then performed beginning at the proximal common femoral artery extending down including the orifice of the profunda and a plaque tapered off about 3-4 cm down the profunda. There was also a plaque extending down the superficial femoral artery which was endarterectomized down to about 5-6 cm. Following this the  superficial femoral was widely patent. A few tacking sutures of 6-0 Prolene were placed in the distal antrum. Dacron patch was then fashioned appropriately and sewn in place with 6-0 Prolene. Following appropriate flushing this was completed and clamps released there was an excellent pulse in the left femoral limb and good Doppler flow in the superficial and profunda femoris arteries. Protamine was given to reverse the heparin following adequate hemostasis was irrigated with saline closed in layers with Vicryl in a subcuticular fashion with Dermabond patient taken to recovery in stable condition   [name], MD 02/29/2012 10:03 AM

## 2012-02-29 NOTE — Preoperative (Signed)
Beta Blockers   Reason not to administer Beta Blockers:Not Applicable 

## 2012-02-29 NOTE — Anesthesia Preprocedure Evaluation (Addendum)
Anesthesia Evaluation  Patient identified by MRN, date of birth, ID band Patient awake    Reviewed: Allergy & Precautions, H&P , NPO status , Patient's Chart, lab work & pertinent test results  Airway Mallampati: II      Dental   Pulmonary sleep apnea , COPD breath sounds clear to auscultation        Cardiovascular hypertension, Pt. on medications Rhythm:Regular Rate:Normal     Neuro/Psych Anxiety    GI/Hepatic Neg liver ROS, GERD-  Controlled,  Endo/Other  Diabetes mellitus-Hypothyroidism   Renal/GU negative Renal ROS     Musculoskeletal negative musculoskeletal ROS (+)   Abdominal   Peds  Hematology negative hematology ROS (+)   Anesthesia Other Findings   Reproductive/Obstetrics                          Anesthesia Physical Anesthesia Plan  ASA: III  Anesthesia Plan: MAC   Post-op Pain Management:    Induction: Intravenous  Airway Management Planned: Oral ETT  Additional Equipment:   Intra-op Plan:   Post-operative Plan:   Informed Consent: I have reviewed the patients History and Physical, chart, labs and discussed the procedure including the risks, benefits and alternatives for the proposed anesthesia with the patient or authorized representative who has indicated his/her understanding and acceptance.     Plan Discussed with: CRNA  Anesthesia Plan Comments:        Anesthesia Quick Evaluation

## 2012-02-29 NOTE — Progress Notes (Signed)
Inpatient Diabetes Program Recommendations  AACE/ADA: New Consensus Statement on Inpatient Glycemic Control (2009)  Target Ranges:  Prepandial:   less than 140 mg/dL      Peak postprandial:   less than 180 mg/dL (1-2 hours)      Critically ill patients:  140 - 180 mg/dL   Consult  Inpatient Diabetes Program Recommendations Insulin - Basal: Needs Lantus 60 units ASAP May not need full home dose of Lantus, may only need 1/2.  Monitor and titrate accordingly.  Note: spoke with patient and she reports taking Lantus 30 units last PM (1/2 normal dose).  Hypoglycemia CBG=67 at 1400 most likely related to Novolog 20 units total given this A.M.  Pt will still need her Lantus dose but waiting until hypoglycemia is resolved will be appropriate. Spoke with RN and she will treat with 15 grams juice or regular cola.    Lab glucose was 631 at 1525 yesterday. Patient reports having AM hypoglycemia at times.  Asked patient to record CBGs and report to primary MD (Dr. Tomasa Blase) managing DM.  Insulin doses probably need adjustment.    Thank you  Piedad Climes [facility] Inpatient Diabetes Coordinator [phone]

## 2012-02-29 NOTE — H&P (View-Only) (Signed)
Subjective:     Patient ID: [name], female   DOB: [birth year], 71 y.o.   MRN: [id number]  HPI this 71 year old female returns today to discuss the recent angiographic findings. She has left leg claudication symptoms after walking about 50 yards which began in the left hip area and extending into the left calf. She also has some cramping sensations in her right leg at night. Angiograms performed by Dr. 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 This appears to be approximately 80% stenotic.  Past Medical History  Diagnosis Date  . Arthritis   . Hip pain   . Peripheral vascular disease   . Cancer     BREAST    History  Substance Use Topics  . Smoking status: Current Everyday Smoker -- 1.0 packs/day for 50 years    Types: Cigarettes  . Smokeless tobacco: Never Used  . Alcohol Use: Yes    Family History  Problem Relation Age of Onset  . Heart disease Mother   . Cancer Father     LUNG  . Cancer Sister     BONE    Allergies  Allergen Reactions  . Penicillins     Current outpatient prescriptions:anastrozole (ARIMIDEX) 1 MG tablet, Take 1 mg by mouth daily., Disp: , Rfl: ;  aspirin EC 81 MG tablet, Take 162 mg by mouth daily., Disp: , Rfl: ;  atenolol (TENORMIN) 100 MG tablet, Take 100 mg by mouth daily., Disp: , Rfl: ;  Calcium-Magnesium-Vitamin D (CALCIUM MAGNESIUM PO), Take 1 tablet by mouth 2 (two) times daily., Disp: , Rfl: ;  fenofibrate 160 MG tablet, Take 160 mg by mouth daily., Disp: , Rfl:  fish oil-omega-3 fatty acids 1000 MG capsule, Take 1 g by mouth 2 (two) times daily., Disp: , Rfl: ;  levothyroxine (SYNTHROID, LEVOTHROID) 125 MCG tablet, Take 125 mcg by mouth daily., Disp: , Rfl: ;  lisinopril (PRINIVIL,ZESTRIL) 40 MG tablet, Take 40 mg by mouth at bedtime., Disp: , Rfl: ;  losartan-hydrochlorothiazide (HYZAAR) 100-25 MG per tablet, Take 1 tablet by mouth daily.,  Disp: , Rfl:  omeprazole (PRILOSEC) 20 MG capsule, Take 20 mg by mouth 2 (two) times daily., Disp: , Rfl: ;  OVER THE COUNTER MEDICATION, Take 1 tablet by mouth daily as needed. OTC Leg cramp tablet  As needed for leg cramps, Disp: , Rfl: ;  PARoxetine (PAXIL) 40 MG tablet, Take 40 mg by mouth every morning., Disp: , Rfl: ;  rOPINIRole (REQUIP) 4 MG tablet, Take 4 mg by mouth at bedtime., Disp: , Rfl:  losartan (COZAAR) 100 MG tablet, Take 100 mg by mouth at bedtime., Disp: , Rfl:   BP 154/73  Pulse 60  Resp 16  Ht 5\' 2"  (1.575 m)  Wt 137 lb (62.143 kg)  BMI 25.06 kg/m2  Body mass index is 25.06 kg/(m^2).          Review of Systems denies chest pain, dyspnea on exertion, PND, orthopnea. All other systems are negative and complete review of     Objective:   Physical Exam blood pressure 154/73 heart rate 60 respirations 16 Gen.-alert and oriented x3 in no apparent distress HEENT normal for age Lungs no rhonchi or wheezing Cardiovascular regular rhythm no murmurs carotid pulses 3+ palpable no bruits audible Abdomen soft nontender no palpable masses Musculoskeletal free of  major deformities Skin clear -no rashes Neurologic normal Lower  extremities 3+ femoral pulse on the right 2+ on the left. Right leg with 2+ popliteal 2+ dorsalis pedis pulse. Left leg with 1+ popliteal 1+ dorsalis pedis pulse.  Recent ABIs were 0.68 on left and 0.86 on the right      Assessment:     Stenosis left common femoral artery left limb aortobifemoral bypass graft with limiting claudication    Plan:     Plan left femoral endarterectomy and/or revision on Friday, May 10

## 2012-02-29 NOTE — Interval H&P Note (Signed)
History and Physical Interval Note:  02/29/2012 7:27 AM  [name]  has presented today for surgery, with the diagnosis of PVD  The various methods of treatment have been discussed with the patient and family. After consideration of risks, benefits and other options for treatment, the patient has consented to  Procedure(s) (LRB): ENDARTERECTOMY FEMORAL (Left) as a surgical intervention .  The patients' history has been reviewed, patient examined, no change in status, stable for surgery.  I have reviewed the patients' chart and labs.  Questions were answered to the patient's satisfaction.     [name]

## 2012-02-29 NOTE — Progress Notes (Signed)
Notified Dr.Joslin of blood sugar of 358-orders received for Novolog 10units now

## 2012-02-29 NOTE — Anesthesia Postprocedure Evaluation (Signed)
  Anesthesia Post-op Note  Patient: [name]  Procedure(s) Performed: Procedure(s) (LRB): ENDARTERECTOMY FEMORAL (Left)  Patient Location: PACU  Anesthesia Type: General  Level of Consciousness: awake  Airway and Oxygen Therapy: Patient Spontanous Breathing  Post-op Pain: mild  Post-op Assessment: Post-op Vital signs reviewed  Post-op Vital Signs: Reviewed  Complications: No apparent anesthesia complications

## 2012-02-29 NOTE — Transfer of Care (Signed)
Immediate Anesthesia Transfer of Care Note  Patient: [name] Janice  Procedure(s) Performed: Procedure(s) (LRB): ENDARTERECTOMY FEMORAL (Left)  Patient Location: PACU  Anesthesia Type: General  Level of Consciousness: awake, alert  and oriented  Airway & Oxygen Therapy: Patient Spontanous Breathing  Post-op Assessment: Report given to PACU RN and Post -op Vital signs reviewed and stable  Post vital signs: Reviewed and stable  Complications: No apparent anesthesia complications

## 2012-02-29 NOTE — Progress Notes (Signed)
Utilization review completed.  

## 2012-03-01 LAB — GLUCOSE, CAPILLARY: Glucose-Capillary: 244 mg/dL — ABNORMAL HIGH (ref 70–99)

## 2012-03-01 LAB — CBC
HCT: 36.5 % (ref 36.0–46.0)
Hemoglobin: 12.5 g/dL (ref 12.0–15.0)
MCH: 30.5 pg (ref 26.0–34.0)
MCHC: 34.2 g/dL (ref 30.0–36.0)
MCV: 89 fL (ref 78.0–100.0)
RDW: 13.3 % (ref 11.5–15.5)

## 2012-03-01 LAB — BASIC METABOLIC PANEL
BUN: 9 mg/dL (ref 6–23)
Creatinine, Ser: 0.91 mg/dL (ref 0.50–1.10)
GFR calc Af Amer: 72 mL/min — ABNORMAL LOW (ref >90)
GFR calc non Af Amer: 62 mL/min — ABNORMAL LOW (ref >90)
Glucose, Bld: 318 mg/dL — ABNORMAL HIGH (ref 70–99)

## 2012-03-01 MED ORDER — OXYCODONE HCL 5 MG PO TABS: 5.0000 mg | ORAL_TABLET | ORAL | Status: AC | PRN

## 2012-03-01 NOTE — Discharge Summary (Signed)
[facility] Discharge Summary   Patient ID:  [name] MRN: [id number] DOB/AGE: 05/22/1941 71 y.o.  Admit date: 02/29/2012 Discharge date: 03/01/2012 Date of Surgery: 02/29/2012 Surgeon: Surgeon(s): Pryor Ochoa, MD  Admission Diagnosis: PVD  Discharge Diagnoses:  PVD  Secondary Diagnoses: Past Medical History  Diagnosis Date  . Arthritis   . Hip pain   . Peripheral vascular disease   . Cancer     BREAST  . Hypertension   . Sleep apnea     does not use cpap   . Diabetes mellitus     type 2 IDDM x 10 years  . Hypothyroidism   . Blood transfusion 2011  . GERD (gastroesophageal reflux disease)   . Depression   . COPD (chronic obstructive pulmonary disease)     Procedure(s): ENDARTERECTOMY FEMORAL  Discharged Condition: good  HPI:  71 year old female returns today to discuss the recent angiographic findings. She has left leg claudication symptoms after walking about 50 yards which began in the left hip area and extending into the left calf. She also has some cramping sensations in her right leg at night. Angiograms performed by Dr. 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 This appears to be approximately 80% stenotic. She was admitted for left Fem endarterectomy with patch angioplasty   Hospital Course:  [name] is a 71 y.o. female is S/P Left Procedure(s): ENDARTERECTOMY FEMORAL Extubated: POD # 0 Post-op wounds healing well Pt. Ambulating, voiding and taking PO diet without difficulty. Pt pain controlled with PO pain meds. Labs as below Complications:none  Consults:     Significant Diagnostic Studies: CBC Lab Results  Component Value Date   WBC 6.9 02/29/2012   HGB 12.5 02/29/2012   HCT 36.5 02/29/2012   MCV 89.0 02/29/2012   PLT 192 02/29/2012    BMET    Component Value Date/Time   NA 136 02/29/2012 2348   K 3.3*  02/29/2012 2348   CL 103 02/29/2012 2348   CO2 24 02/29/2012 2348   GLUCOSE 318* 02/29/2012 2348   BUN 9 02/29/2012 2348   CREATININE 0.91 02/29/2012 2348   CALCIUM 8.2* 02/29/2012 2348   GFRNONAA 62* 02/29/2012 2348   GFRAA 72* 02/29/2012 2348   COAG Lab Results  Component Value Date   INR 0.98 02/28/2012   INR 1.16 12/20/2010   INR 0.95 12/18/2010     Disposition:  Discharge to :Home Discharge Orders    Future Orders Please Complete By Expires   Resume previous diet      Driving Restrictions      Comments:   No driving for 1 weeks   Lifting restrictions      Comments:   No lifting for 6 weeks   Call MD for:  temperature >100.5      Call MD for:  redness, tenderness, or signs of infection (pain, swelling, bleeding, redness, odor or green/yellow discharge around incision site)      Call MD for:  severe or increased pain, loss or decreased feeling  in affected limb(s)         Jewelia, Bocchino  Home Medication Instructions [id number]   Printed on:03/01/12 0858  Medication Information                    lisinopril (PRINIVIL,ZESTRIL) 40 MG tablet Take 40 mg by mouth at bedtime.  rOPINIRole (REQUIP) 4 MG tablet Take 4 mg by mouth at bedtime.           levothyroxine (SYNTHROID, LEVOTHROID) 125 MCG tablet Take 125 mcg by mouth daily.           fenofibrate 160 MG tablet Take 160 mg by mouth daily.           PARoxetine (PAXIL) 40 MG tablet Take 40 mg by mouth every morning.           losartan-hydrochlorothiazide (HYZAAR) 100-25 MG per tablet Take 1 tablet by mouth daily.           atenolol (TENORMIN) 100 MG tablet Take 100 mg by mouth daily.           aspirin EC 81 MG tablet Take 162 mg by mouth daily.           omeprazole (PRILOSEC) 20 MG capsule Take 20 mg by mouth 2 (two) times daily.           fish oil-omega-3 fatty acids 1000 MG capsule Take 1 g by mouth 2 (two) times daily.           Calcium-Magnesium-Vitamin D (CALCIUM MAGNESIUM PO) Take 1 tablet by  mouth 2 (two) times daily.           OVER THE COUNTER MEDICATION Take 1 tablet by mouth daily as needed. OTC Leg cramp tablet  As needed for leg cramps           anastrozole (ARIMIDEX) 1 MG tablet Take 1 mg by mouth daily.           insulin glargine (LANTUS) 100 UNIT/ML injection Inject 60 Units into the skin 2 (two) times daily.           oxyCODONE (OXY IR/ROXICODONE) 5 MG immediate release tablet Take 1-2 tablets (5-10 mg total) by mouth every 4 (four) hours as needed.            Verbal and written Discharge instructions given to the patient. Wound care per Discharge AVS   Signed: Marlowe Shores 03/01/2012, 8:58 AM     I have examined the patient, reviewed and agree with above.  Alicia Person, MD 03/01/2012 9:21 AM

## 2012-03-01 NOTE — Progress Notes (Addendum)
[facility] [place]  Progress Note Bypass Surgery  Date of Surgery: 02/29/2012  Procedure(s): Left common femoral, superficial femoral, and profunda femoris endarterectomy with Dacron patch angioplasty  Surgeon: Surgeon(s): Pryor Ochoa, MD  1 Day Post-Op  History of Present Illness  [name] is a 71 y.o. female who is S/P Procedure(s): ENDARTERECTOMY FEMORAL left.  The patient's pre-op symptoms of pain are Improved . Patients pain is well controlled.     Significant Diagnostic Studies: CBC Lab Results  Component Value Date   WBC 6.9 02/29/2012   HGB 12.5 02/29/2012   HCT 36.5 02/29/2012   MCV 89.0 02/29/2012   PLT 192 02/29/2012    BMET     Component Value Date/Time   NA 136 02/29/2012 2348   K 3.3* 02/29/2012 2348   CL 103 02/29/2012 2348   CO2 24 02/29/2012 2348   GLUCOSE 318* 02/29/2012 2348   BUN 9 02/29/2012 2348   CREATININE 0.91 02/29/2012 2348   CALCIUM 8.2* 02/29/2012 2348   GFRNONAA 62* 02/29/2012 2348   GFRAA 72* 02/29/2012 2348    COAG Lab Results  Component Value Date   INR 0.98 02/28/2012   INR 1.16 12/20/2010   INR 0.95 12/18/2010   No results found for this basename: PTT    Physical Examination  BP Readings from Last 3 Encounters:  03/01/12 95/40  03/01/12 95/40  02/28/12 177/73   Temp Readings from Last 3 Encounters:  03/01/12 98.5 F (36.9 C) Oral  03/01/12 98.5 F (36.9 C) Oral  02/28/12 98.7 F (37.1 C) Oral   SpO2 Readings from Last 3 Encounters:  03/01/12 95%  03/01/12 95%  02/28/12 94%   Pulse Readings from Last 3 Encounters:  03/01/12 79  03/01/12 79  02/28/12 87    Pt is A&O x 3 left lower extremity: Incision/s is/are mildly erythematous and clean,dry.intact, Limb is warm; with good color  Right Dorsalis Pedis pulse is palpable 1+  Left Dorsalis Pedis pulse is palpable 1+  Assessment/Plan: Pt. Doing well Post-op pain is controlled Wounds are healing well DC home today Continue wound care  as ordered  Marlowe Shores [phone] 03/01/2012 8:54 AM        I have examined the patient, reviewed and agree with above.  Moira Umholtz, MD 03/01/2012 9:21 AM

## 2012-03-01 NOTE — Progress Notes (Signed)
Pt discharged home per MD order. All discharge orders reviewed and all questions answered.  

## 2012-03-01 NOTE — Evaluation (Signed)
Physical Therapy Evaluation Patient Details Name: OVIE EASTEP MRN: [id number] DOB: [birth year] Today's Date: 03/01/2012 Time: 4098-1191 PT Time Calculation (min): 23 min  PT Assessment / Plan / Recommendation Clinical Impression  71 year old s/p Left common femoral, superficial femoral, and profunda femoris endarterectomy with Dacron patch angioplasty. Pt presents with supervision level mobility overall and will have necessary level of assist at home. Will benefit from shower chair for home management. Recommend continued use of RW at this time, pt declines HHPT. Recommend continued ambulation with nursing, PT signing off.    PT Assessment  Patent does not need any further PT services    Follow Up Recommendations  No PT follow up    Barriers to Discharge    None      lEquipment Recommendations  Tub/shower seat          Precautions / Restrictions Precautions Precautions: None Restrictions Weight Bearing Restrictions: No   Pertinent Vitals/Pain 4/10 at end of session      Mobility  Bed Mobility Bed Mobility: Rolling Right;Right Sidelying to Sit Rolling Right: 6: Modified independent (Device/Increase time) Right Sidelying to Sit: 6: Modified independent (Device/Increase time) Transfers Transfers: Sit to Stand;Stand to Sit Sit to Stand: 5: Supervision Stand to Sit: 5: Supervision Ambulation/Gait Ambulation/Gait Assistance: 5: Supervision Ambulation Distance (Feet): 250 Feet Assistive device: Rolling walker Gait Pattern: Step-through pattern;Decreased stride length Stairs: No      Visit Information  Last PT Received On: 03/01/12 Assistance Needed: +1    Subjective Data  Subjective: I'm doing well.    Prior Functioning  Home Living Lives With: Spouse;Son (24/7 assist available) Available Help at Discharge: Family Type of Home: House Home Access: Level entry Home Layout: One level Bathroom Shower/Tub: Tub/shower unit;Curtain Firefighter:  Standard Bathroom Accessibility: Yes How Accessible: Accessible via walker Home Adaptive Equipment: Walker - rolling;Bedside commode/3-in-1 (toilet riser) Prior Function Level of Independence: Independent Able to Take Stairs?: Yes Driving: Yes Vocation: Retired Musician: No difficulties    Cognition  Overall Cognitive Status: Appears within functional limits for tasks assessed/performed Arousal/Alertness: Awake/alert Orientation Level: Appears intact for tasks assessed Behavior During Session: [facility] for tasks performed    Extremity/Trunk Assessment Right Lower Extremity Assessment RLE ROM/Strength/Tone: Within functional levels Left Lower Extremity Assessment LLE ROM/Strength/Tone: Within functional levels (functional weakness)   Balance Balance Balance Assessed: Yes Static Standing Balance Static Standing - Balance Support: No upper extremity supported Static Standing - Level of Assistance: 5: Stand by assistance  End of Session PT - End of Session Equipment Utilized During Treatment: Gait belt Activity Tolerance: Patient tolerated treatment well Patient left: in chair;with call bell/phone within reach;with family/visitor present Nurse Communication: Mobility status   Wilhemina Bonito 03/01/2012, 12:24 PM  Sherie Don) Carleene Mains PT, DPT Acute Rehabilitation [phone]

## 2012-09-24 ENCOUNTER — Other Ambulatory Visit: Payer: Self-pay

## 2012-09-24 DIAGNOSIS — I739 Peripheral vascular disease, unspecified: Secondary | ICD-10-CM

## 2012-09-29 ENCOUNTER — Telehealth: Payer: Self-pay | Admitting: Vascular Surgery

## 2012-09-29 NOTE — Telephone Encounter (Signed)
Spoke with [name] regarding scheduling a follow-up appointment with Dr. Hart Rochester. She states that she has cancer now and between the appointments with the cancer center, death in her family, and her husband sickness, she hasn't had any time to schedule an appointment.   She states she is going well and assured me she will call if she needs an appointment.

## 2013-04-13 ENCOUNTER — Ambulatory Visit: Payer: Medicare Other | Admitting: Endocrinology

## 2013-04-13 DIAGNOSIS — Z0289 Encounter for other administrative examinations: Secondary | ICD-10-CM

## 2013-07-23 ENCOUNTER — Ambulatory Visit: Payer: Medicare Other | Admitting: Endocrinology

## 2013-07-23 DIAGNOSIS — Z0289 Encounter for other administrative examinations: Secondary | ICD-10-CM

## 2013-08-10 ENCOUNTER — Ambulatory Visit: Payer: Medicare Other | Admitting: Endocrinology

## 2013-08-10 DIAGNOSIS — Z0289 Encounter for other administrative examinations: Secondary | ICD-10-CM

## 2014-06-15 ENCOUNTER — Ambulatory Visit (INDEPENDENT_AMBULATORY_CARE_PROVIDER_SITE_OTHER): Payer: Medicare Other | Admitting: Internal Medicine

## 2014-06-15 ENCOUNTER — Encounter: Payer: Self-pay | Admitting: Internal Medicine

## 2014-06-15 VITALS — BP 106/58 | HR 55 | Temp 97.6°F | Resp 12 | Ht 62.0 in | Wt 108.0 lb

## 2014-06-15 DIAGNOSIS — E1159 Type 2 diabetes mellitus with other circulatory complications: Secondary | ICD-10-CM

## 2014-06-15 DIAGNOSIS — Z794 Long term (current) use of insulin: Secondary | ICD-10-CM

## 2014-06-15 DIAGNOSIS — E118 Type 2 diabetes mellitus with unspecified complications: Secondary | ICD-10-CM | POA: Insufficient documentation

## 2014-06-15 HISTORY — DX: Type 2 diabetes mellitus with other circulatory complications: E11.59

## 2014-06-15 MED ORDER — INSULIN GLARGINE 100 UNIT/ML SOLOSTAR PEN: 45.0000 [IU] | PEN_INJECTOR | Freq: Every day | SUBCUTANEOUS | Status: DC

## 2014-06-15 MED ORDER — INSULIN GLULISINE 100 UNIT/ML SOLOSTAR PEN: 5.0000 [IU] | PEN_INJECTOR | Freq: Three times a day (TID) | SUBCUTANEOUS | Status: DC

## 2014-06-15 NOTE — Patient Instructions (Signed)
Please decrease the Lantus to 45 units at bedtime - please take it every night!  Take Apidra 3x a day: - 5 units if you do not eat  - 7 units before a smaller meal  - 9 units before a large meal  Try to start eating meals.  Please inject Apidra 15 min before a meal if you eat a meal. When injecting insulin:  Inject in the abdomen  Rotate the injection sites around the belly button  Change needle for each injection  Keep needle in for 10 sec after last unit of insulin in  Keep the insulin in use out of the fridge  Please return in 1 month with your sugar log. Check sugars 3x a day, before meals.  PATIENT INSTRUCTIONS FOR TYPE 2 DIABETES:  **Please join MyChart!** - see attached instructions about how to join if you have not done so already.  DIET AND EXERCISE Diet and exercise is an important part of diabetic treatment.  We recommended aerobic exercise in the form of brisk walking (working between 40-60% of maximal aerobic capacity, similar to brisk walking) for 150 minutes per week (such as 30 minutes five days per week) along with 3 times per week performing 'resistance' training (using various gauge rubber tubes with handles) 5-10 exercises involving the major muscle groups (upper body, lower body and core) performing 10-15 repetitions (or near fatigue) each exercise. Start at half the above goal but build slowly to reach the above goals. If limited by weight, joint pain, or disability, we recommend daily walking in a swimming pool with water up to waist to reduce pressure from joints while allow for adequate exercise.    BLOOD GLUCOSES Monitoring your blood glucoses is important for continued management of your diabetes. Please check your blood glucoses 2-4 times a day: fasting, before meals and at bedtime (you can rotate these measurements - e.g. one day check before the 3 meals, the next day check before 2 of the meals and before bedtime, etc.).   HYPOGLYCEMIA (low blood  sugar) Hypoglycemia is usually a reaction to not eating, exercising, or taking too much insulin/ other diabetes drugs.  Symptoms include tremors, sweating, hunger, confusion, headache, etc. Treat IMMEDIATELY with 15 grams of Carbs:   4 glucose tablets    cup regular juice/soda   2 tablespoons raisins   4 teaspoons sugar   1 tablespoon honey Recheck blood glucose in 15 mins and repeat above if still symptomatic/blood glucose <100.  RECOMMENDATIONS TO REDUCE YOUR RISK OF DIABETIC COMPLICATIONS: * Take your prescribed MEDICATION(S) * Follow a DIABETIC diet: Complex carbs, fiber rich foods, (monounsaturated and polyunsaturated) fats * AVOID saturated/trans fats, high fat foods, >2,300 mg salt per day. * EXERCISE at least 5 times a week for 30 minutes or preferably daily.  * DO NOT SMOKE OR DRINK more than 1 drink a day. * Check your FEET every day. Do not wear tightfitting shoes. Contact us if you develop an ulcer * See your EYE doctor once a year or more if needed * Get a FLU shot once a year * Get a PNEUMONIA vaccine once before and once after age 70 years  GOALS:  * Your Hemoglobin A1c of <7%  * fasting sugars need to be <130 * after meals sugars need to be <180 (2h after you start eating) * Your Systolic BP should be 270 or lower  * Your Diastolic BP should be 80 or lower  * Your HDL (Good Cholesterol) should be 40  or higher  * Your LDL (Bad Cholesterol) should be 100 or lower. * Your Triglycerides should be 150 or lower  * Your Urine microalbumin (kidney function) should be <30 * Your Body Mass Index should be 25 or lower   We will be glad to help you achieve these goals. Our telephone number is: [phone].

## 2014-06-15 NOTE — Progress Notes (Signed)
Patient ID: [name], female   DOB: [birth year], 73 y.o.   MRN: [id number]  HPI: [name] is a 73 y.o.-year-old female, referred by her PCP, Dr.Schultz, for management of DM2, insulin-dependent, uncontrolled, with complications (PVD, peripheral neuropathy) .  Patient has been diagnosed with diabetes in 1995; she started insulin later in the course of the ds (cannot remember when)  Last hemoglobin A1c was: 05/04/2014: HbA1c 11% 07/13/2013: HbA1c 11.4% Lab Results  Component Value Date   HGBA1C 12.2* 02/29/2012   HGBA1C  Value: 10.7 (NOTE)                                                                       According to the ADA Clinical Practice Recommendations for 2011, when HbA1c is used as a screening test:   >=6.5%   Diagnostic of Diabetes Mellitus           (if abnormal result  is confirmed)  5.7-6.4%   Increased risk of developing Diabetes Mellitus  References:Diagnosis and Classification of Diabetes Mellitus,Diabetes QVZD,6387,56(EPPIR 1):S62-S69 and Standards of Medical Care in         Diabetes - 2011,Diabetes Care,2011,34  (Suppl 1):S11-S61.* 12/20/2010   Pt is on a regimen of: - Lantus 60 >> 70 units 2x a day  - she does not take it if her sugars are normal, then takes 50 units if the sugars are in the 200s (~3x a week) - Apidra 20 units tid ac - has nausea and decreased appetite >> cannot eat a full meal  - she does not take the Apidra Noncompliance was always an issue with the pt, per PCP's notes.  Pt checks her sugars 2x a day and they are: - am: 296-300s - 2h after b'fast: n/c - before lunch: n/c - 2h after lunch: n/c - before dinner: n/c - 2h after dinner: n/c - bedtime: n/c - nighttime: 300s or higher No recent lows. Lowest sugar was 40s, but once it got so low >> she passed out >> paramedics had to come to her house; she has hypoglycemia awareness at 60. Highest sugar was HI.  Pt's meals are: - Breakfast: none - Lunch: none - Dinner: none, sometimes fish -  Snacks: none She drinks 2 sweet sodas a day and 2 cups of coffee with cream and sugar in am.  - + CKD, last BUN/creatinine: 05/04/2014: 7/1.05 (GFR 53) Lab Results  Component Value Date   BUN 9 02/29/2012   CREATININE 0.91 02/29/2012  She has MAU: ACR 243 on 05/04/2014.She is on Lisinopril and Losartan (?). - last set of lipids: 05/04/2014: 175/266/23/99 - last eye exam was in 2013, she believes. No DR reportedly.  - + numbness and tingling in her feet. Last foot exam: neuropathy, no decreased pulses - per PCP (04/30/2014).  Pt has FH of DM in 3 brothers, her mother, nephew.    She also has hypothyroidism, last TSH 8.69 (05/04/2014).   She got Prevnar 13 and Pneumococcal vaccine on 05/04/2014.   ROS: Constitutional: + weight loss, + fatigue, + hot flushes Eyes: + blurry vision, no xerophthalmia ENT: no sore throat, no nodules palpated in throat, no dysphagia/odynophagia, no hoarseness, + tinnitus, + hypoacusis Cardiovascular: + CP/no SOB/no palpitations/+ leg swelling Respiratory: +  cough/no SOB Gastrointestinal: + N/+ V/+ D/no C, + heartburn Musculoskeletal: + muscle aches/+ joint aches Skin: no rashes, + itching, + easy bruising Neurological: no tremors/numbness/tingling/dizziness, + HA Psychiatric: + both: depression/anxiety  Past Medical History  Diagnosis Date  . Arthritis   . Hip pain   . Peripheral vascular disease   . Cancer     BREAST  . Hypertension   . Sleep apnea     does not use cpap   . Diabetes mellitus     type 2 IDDM x 10 years  . Hypothyroidism   . Blood transfusion 2011  . GERD (gastroesophageal reflux disease)   . Depression   . COPD (chronic obstructive pulmonary disease)    Past Surgical History  Procedure Laterality Date  . Feet surgery    . Cholecystectomy    . Pr vein bypass graft,aorto-fem-pop    . Breast surgery  2009    lumpectomy Rt breast  . Appendectomy    . Tubal ligation     History   Social History  . Marital Status:  Married    Spouse Name: N/A    Number of Children: 4   Occupational History  . retired   Social History Main Topics  . Smoking status: Current Every Day Smoker -- 1.00 packs/day for 50 years    Types: Cigarettes  . Smokeless tobacco: Never Used  . Alcohol Use: Beer 1x a day  . Drug Use: No   Current Outpatient Prescriptions on File Prior to Visit  Medication Sig Dispense Refill  . anastrozole (ARIMIDEX) 1 MG tablet Take 1 mg by mouth daily.      Marland Kitchen aspirin EC 81 MG tablet Take 162 mg by mouth daily.      Marland Kitchen atenolol (TENORMIN) 100 MG tablet Take 100 mg by mouth daily.      . Calcium-Magnesium-Vitamin D (CALCIUM MAGNESIUM PO) Take 1 tablet by mouth 2 (two) times daily.      . fenofibrate 160 MG tablet Take 160 mg by mouth daily.      . fish oil-omega-3 fatty acids 1000 MG capsule Take 1 g by mouth 2 (two) times daily.      . insulin glargine (LANTUS) 100 UNIT/ML injection Inject 60 Units into the skin 2 (two) times daily.      Marland Kitchen levothyroxine (SYNTHROID, LEVOTHROID) 125 MCG tablet Take 125 mcg by mouth daily.      Marland Kitchen lisinopril (PRINIVIL,ZESTRIL) 40 MG tablet Take 40 mg by mouth at bedtime.      Marland Kitchen losartan-hydrochlorothiazide (HYZAAR) 100-25 MG per tablet Take 1 tablet by mouth daily.      Marland Kitchen omeprazole (PRILOSEC) 20 MG capsule Take 20 mg by mouth 2 (two) times daily.      Marland Kitchen OVER THE COUNTER MEDICATION Take 1 tablet by mouth daily as needed. OTC Leg cramp tablet  As needed for leg cramps      . PARoxetine (PAXIL) 40 MG tablet Take 40 mg by mouth every morning.      Marland Kitchen rOPINIRole (REQUIP) 4 MG tablet Take 4 mg by mouth at bedtime.       No current facility-administered medications on file prior to visit.   Allergies  Allergen Reactions  . Penicillins Swelling and Rash   Family History  Problem Relation Age of Onset  . Heart disease Mother   . Cancer Father     LUNG  . Cancer Sister     BONE  . Anesthesia problems Neg Hx  PE: BP 106/58  Pulse 55  Temp(Src) 97.6 F (36.4 C)  (Oral)  Resp 12  Ht 5\' 2"  (1.575 m)  Wt 108 lb (48.988 kg)  BMI 19.75 kg/m2  SpO2 96% Wt Readings from Last 3 Encounters:  06/15/14 108 lb (48.988 kg)  02/29/12 141 lb 15.6 oz (64.4 kg)  02/29/12 141 lb 15.6 oz (64.4 kg)   Constitutional: very thin, in NAD Eyes: PERRLA, EOMI, no exophthalmos ENT: moist mucous membranes, no thyromegaly, no cervical lymphadenopathy Cardiovascular: RRR, No MRG Respiratory: CTA B Gastrointestinal: abdomen soft, NT, ND, BS+ Musculoskeletal: no deformities, strength intact in all 4 Skin: moist, warm, no rashes Neurological: no tremor with outstretched hands, DTR normal in all 4  ASSESSMENT: 1. DM2, insulin-dependent, uncontrolled, with complications - PVD - Peripheral neuropathy  2. Hypothyroidism  PLAN:  1. Patient with long-standing, uncontrolled diabetes, noncompliant with her high doses of basal-bolus insulin regimen - I underlined the need for compliance with daily insulin, but she is afraid that she drops her sugars at the high doses that she is on now >> will reduce her doses and we also need to take into account the fact that she mostly consumes liquid calories/carbs -  I suggested to:  Patient Instructions  Please decrease the Lantus to 45 units at bedtime - please take it every night!  Take Apidra 3x a day: - 5 units if you do not eat  - 7 units before a smaller meal  - 9 units before a large meal  Try to start eating meals.  Please inject Apidra 15 min before a meal if you eat a meal. When injecting insulin:  Inject in the abdomen  Rotate the injection sites around the belly button  Change needle for each injection  Keep needle in for 10 sec after last unit of insulin in  Keep the insulin in use out of the fridge  Please return in 1 month with your sugar log. Check sugars 3x a day, before meals.  - Strongly advised her to start checking sugars at different times of the day - check 3 times a day, rotating checks - given  sugar log and advised how to fill it and to bring it at next appt  - given foot care handout and explained the principles  - given instructions for hypoglycemia management "15-15 rule"  - advised for yearly eye exams >> she needs a new exam >> will call and schedule - Return to clinic in 1 mo with sugar log   2. Hypothyroidism - her TSH was high (8) at last visit with PCP. She does not remember if her thyroid med dose has been changed, but I see that she was on 100 mcg daily and is now on 125 mcg - we did not address this today, this is followed by PCP

## 2014-07-28 ENCOUNTER — Ambulatory Visit: Payer: Medicare Other | Admitting: Internal Medicine

## 2014-07-28 ENCOUNTER — Telehealth: Payer: Self-pay | Admitting: Internal Medicine

## 2014-07-28 DIAGNOSIS — Z0289 Encounter for other administrative examinations: Secondary | ICD-10-CM

## 2014-07-28 NOTE — Telephone Encounter (Signed)
Patient no showed today's appt. Please advise on how to follow up. °A. No follow up necessary. °B. Follow up urgent. Contact patient immediately. °C. Follow up necessary. Contact patient and schedule visit in ___ days. °D. Follow up advised. Contact patient and schedule visit in ____weeks. ° °

## 2014-07-29 NOTE — Telephone Encounter (Signed)
1 mo 

## 2014-07-29 NOTE — Telephone Encounter (Signed)
Please read note below

## 2014-09-30 ENCOUNTER — Encounter (HOSPITAL_COMMUNITY): Payer: Self-pay | Admitting: Vascular Surgery

## 2015-01-27 DIAGNOSIS — Z9889 Other specified postprocedural states: Secondary | ICD-10-CM | POA: Diagnosis not present

## 2015-01-27 DIAGNOSIS — R928 Other abnormal and inconclusive findings on diagnostic imaging of breast: Secondary | ICD-10-CM | POA: Diagnosis not present

## 2015-01-27 DIAGNOSIS — C50511 Malignant neoplasm of lower-outer quadrant of right female breast: Secondary | ICD-10-CM | POA: Diagnosis not present

## 2015-02-03 DIAGNOSIS — Z853 Personal history of malignant neoplasm of breast: Secondary | ICD-10-CM | POA: Diagnosis not present

## 2015-02-03 DIAGNOSIS — I10 Essential (primary) hypertension: Secondary | ICD-10-CM | POA: Diagnosis not present

## 2015-02-03 DIAGNOSIS — Z681 Body mass index (BMI) 19 or less, adult: Secondary | ICD-10-CM | POA: Diagnosis not present

## 2015-02-03 DIAGNOSIS — M8589 Other specified disorders of bone density and structure, multiple sites: Secondary | ICD-10-CM | POA: Diagnosis not present

## 2015-02-03 DIAGNOSIS — Z170 Estrogen receptor positive status [ER+]: Secondary | ICD-10-CM | POA: Diagnosis not present

## 2015-02-03 DIAGNOSIS — C44119 Basal cell carcinoma of skin of left eyelid, including canthus: Secondary | ICD-10-CM | POA: Diagnosis not present

## 2015-02-03 DIAGNOSIS — E876 Hypokalemia: Secondary | ICD-10-CM | POA: Diagnosis not present

## 2015-02-03 DIAGNOSIS — F419 Anxiety disorder, unspecified: Secondary | ICD-10-CM | POA: Diagnosis not present

## 2015-02-03 DIAGNOSIS — C50511 Malignant neoplasm of lower-outer quadrant of right female breast: Secondary | ICD-10-CM | POA: Diagnosis not present

## 2015-02-03 DIAGNOSIS — E039 Hypothyroidism, unspecified: Secondary | ICD-10-CM | POA: Diagnosis not present

## 2015-02-10 DIAGNOSIS — Z720 Tobacco use: Secondary | ICD-10-CM | POA: Diagnosis not present

## 2015-02-10 DIAGNOSIS — I10 Essential (primary) hypertension: Secondary | ICD-10-CM | POA: Diagnosis not present

## 2015-02-10 DIAGNOSIS — R05 Cough: Secondary | ICD-10-CM | POA: Diagnosis not present

## 2015-02-10 DIAGNOSIS — Z681 Body mass index (BMI) 19 or less, adult: Secondary | ICD-10-CM | POA: Diagnosis not present

## 2015-02-10 DIAGNOSIS — R6881 Early satiety: Secondary | ICD-10-CM | POA: Diagnosis not present

## 2015-02-14 DIAGNOSIS — R05 Cough: Secondary | ICD-10-CM | POA: Diagnosis not present

## 2015-03-01 DIAGNOSIS — J189 Pneumonia, unspecified organism: Secondary | ICD-10-CM | POA: Diagnosis not present

## 2015-03-01 DIAGNOSIS — I7090 Unspecified atherosclerosis: Secondary | ICD-10-CM | POA: Diagnosis not present

## 2015-03-15 DIAGNOSIS — E1140 Type 2 diabetes mellitus with diabetic neuropathy, unspecified: Secondary | ICD-10-CM | POA: Diagnosis not present

## 2015-03-15 DIAGNOSIS — E785 Hyperlipidemia, unspecified: Secondary | ICD-10-CM | POA: Diagnosis not present

## 2015-03-15 DIAGNOSIS — E039 Hypothyroidism, unspecified: Secondary | ICD-10-CM | POA: Diagnosis not present

## 2015-03-15 DIAGNOSIS — I10 Essential (primary) hypertension: Secondary | ICD-10-CM | POA: Diagnosis not present

## 2015-04-07 DIAGNOSIS — R634 Abnormal weight loss: Secondary | ICD-10-CM | POA: Diagnosis not present

## 2015-04-08 DIAGNOSIS — R634 Abnormal weight loss: Secondary | ICD-10-CM | POA: Diagnosis not present

## 2015-05-06 ENCOUNTER — Other Ambulatory Visit: Payer: Self-pay

## 2015-05-06 DIAGNOSIS — E119 Type 2 diabetes mellitus without complications: Secondary | ICD-10-CM | POA: Diagnosis not present

## 2015-05-06 DIAGNOSIS — K449 Diaphragmatic hernia without obstruction or gangrene: Secondary | ICD-10-CM | POA: Diagnosis not present

## 2015-05-06 DIAGNOSIS — D124 Benign neoplasm of descending colon: Secondary | ICD-10-CM | POA: Diagnosis not present

## 2015-05-06 DIAGNOSIS — E039 Hypothyroidism, unspecified: Secondary | ICD-10-CM | POA: Diagnosis not present

## 2015-05-06 DIAGNOSIS — F17210 Nicotine dependence, cigarettes, uncomplicated: Secondary | ICD-10-CM | POA: Diagnosis not present

## 2015-05-06 DIAGNOSIS — R1012 Left upper quadrant pain: Secondary | ICD-10-CM | POA: Diagnosis not present

## 2015-05-06 DIAGNOSIS — Z86010 Personal history of colonic polyps: Secondary | ICD-10-CM | POA: Diagnosis not present

## 2015-05-06 DIAGNOSIS — K644 Residual hemorrhoidal skin tags: Secondary | ICD-10-CM | POA: Diagnosis not present

## 2015-05-06 DIAGNOSIS — R634 Abnormal weight loss: Secondary | ICD-10-CM | POA: Diagnosis not present

## 2015-05-06 DIAGNOSIS — Z853 Personal history of malignant neoplasm of breast: Secondary | ICD-10-CM | POA: Diagnosis not present

## 2015-05-06 DIAGNOSIS — I10 Essential (primary) hypertension: Secondary | ICD-10-CM | POA: Diagnosis not present

## 2015-05-06 DIAGNOSIS — D126 Benign neoplasm of colon, unspecified: Secondary | ICD-10-CM | POA: Diagnosis not present

## 2015-05-12 DIAGNOSIS — R1031 Right lower quadrant pain: Secondary | ICD-10-CM | POA: Diagnosis not present

## 2015-05-12 DIAGNOSIS — R1032 Left lower quadrant pain: Secondary | ICD-10-CM | POA: Diagnosis not present

## 2015-05-12 DIAGNOSIS — R109 Unspecified abdominal pain: Secondary | ICD-10-CM | POA: Diagnosis not present

## 2015-05-12 DIAGNOSIS — Z9889 Other specified postprocedural states: Secondary | ICD-10-CM | POA: Diagnosis not present

## 2015-05-12 DIAGNOSIS — Z853 Personal history of malignant neoplasm of breast: Secondary | ICD-10-CM | POA: Diagnosis not present

## 2015-05-12 DIAGNOSIS — I2510 Atherosclerotic heart disease of native coronary artery without angina pectoris: Secondary | ICD-10-CM | POA: Diagnosis not present

## 2015-05-12 DIAGNOSIS — I7090 Unspecified atherosclerosis: Secondary | ICD-10-CM | POA: Diagnosis not present

## 2015-05-12 DIAGNOSIS — R634 Abnormal weight loss: Secondary | ICD-10-CM | POA: Diagnosis not present

## 2015-06-02 DIAGNOSIS — D126 Benign neoplasm of colon, unspecified: Secondary | ICD-10-CM | POA: Diagnosis not present

## 2016-02-10 DIAGNOSIS — Z1231 Encounter for screening mammogram for malignant neoplasm of breast: Secondary | ICD-10-CM | POA: Diagnosis not present

## 2016-02-10 DIAGNOSIS — M8589 Other specified disorders of bone density and structure, multiple sites: Secondary | ICD-10-CM | POA: Diagnosis not present

## 2016-02-15 DIAGNOSIS — C50911 Malignant neoplasm of unspecified site of right female breast: Secondary | ICD-10-CM | POA: Diagnosis not present

## 2016-02-15 DIAGNOSIS — R109 Unspecified abdominal pain: Secondary | ICD-10-CM | POA: Diagnosis not present

## 2016-02-15 DIAGNOSIS — Z853 Personal history of malignant neoplasm of breast: Secondary | ICD-10-CM | POA: Diagnosis not present

## 2016-02-22 DIAGNOSIS — R1030 Lower abdominal pain, unspecified: Secondary | ICD-10-CM | POA: Diagnosis not present

## 2016-02-22 DIAGNOSIS — C50511 Malignant neoplasm of lower-outer quadrant of right female breast: Secondary | ICD-10-CM | POA: Diagnosis not present

## 2016-02-22 DIAGNOSIS — R911 Solitary pulmonary nodule: Secondary | ICD-10-CM | POA: Diagnosis not present

## 2016-02-22 DIAGNOSIS — N6489 Other specified disorders of breast: Secondary | ICD-10-CM | POA: Diagnosis not present

## 2016-02-22 DIAGNOSIS — R928 Other abnormal and inconclusive findings on diagnostic imaging of breast: Secondary | ICD-10-CM | POA: Diagnosis not present

## 2016-02-23 DIAGNOSIS — Z170 Estrogen receptor positive status [ER+]: Secondary | ICD-10-CM | POA: Diagnosis not present

## 2016-02-23 DIAGNOSIS — Z79811 Long term (current) use of aromatase inhibitors: Secondary | ICD-10-CM

## 2016-02-23 DIAGNOSIS — C50911 Malignant neoplasm of unspecified site of right female breast: Secondary | ICD-10-CM | POA: Diagnosis not present

## 2016-02-23 DIAGNOSIS — M8580 Other specified disorders of bone density and structure, unspecified site: Secondary | ICD-10-CM | POA: Diagnosis not present

## 2016-02-23 DIAGNOSIS — C773 Secondary and unspecified malignant neoplasm of axilla and upper limb lymph nodes: Secondary | ICD-10-CM | POA: Diagnosis not present

## 2016-02-28 DIAGNOSIS — M5124 Other intervertebral disc displacement, thoracic region: Secondary | ICD-10-CM | POA: Diagnosis not present

## 2016-02-28 DIAGNOSIS — M47812 Spondylosis without myelopathy or radiculopathy, cervical region: Secondary | ICD-10-CM | POA: Diagnosis not present

## 2016-02-28 DIAGNOSIS — M50223 Other cervical disc displacement at C6-C7 level: Secondary | ICD-10-CM | POA: Diagnosis not present

## 2016-03-14 DIAGNOSIS — E1140 Type 2 diabetes mellitus with diabetic neuropathy, unspecified: Secondary | ICD-10-CM | POA: Diagnosis not present

## 2016-03-14 DIAGNOSIS — E785 Hyperlipidemia, unspecified: Secondary | ICD-10-CM | POA: Diagnosis not present

## 2016-03-14 DIAGNOSIS — I10 Essential (primary) hypertension: Secondary | ICD-10-CM | POA: Diagnosis not present

## 2016-03-14 DIAGNOSIS — E039 Hypothyroidism, unspecified: Secondary | ICD-10-CM | POA: Diagnosis not present

## 2016-04-11 DIAGNOSIS — E039 Hypothyroidism, unspecified: Secondary | ICD-10-CM | POA: Diagnosis not present

## 2016-04-11 DIAGNOSIS — Z1389 Encounter for screening for other disorder: Secondary | ICD-10-CM | POA: Diagnosis not present

## 2016-04-11 DIAGNOSIS — E1140 Type 2 diabetes mellitus with diabetic neuropathy, unspecified: Secondary | ICD-10-CM | POA: Diagnosis not present

## 2016-04-11 DIAGNOSIS — I10 Essential (primary) hypertension: Secondary | ICD-10-CM | POA: Diagnosis not present

## 2016-04-11 DIAGNOSIS — E785 Hyperlipidemia, unspecified: Secondary | ICD-10-CM | POA: Diagnosis not present

## 2016-04-11 DIAGNOSIS — Z720 Tobacco use: Secondary | ICD-10-CM | POA: Diagnosis not present

## 2016-05-09 DIAGNOSIS — Z9181 History of falling: Secondary | ICD-10-CM | POA: Diagnosis not present

## 2016-05-09 DIAGNOSIS — Z720 Tobacco use: Secondary | ICD-10-CM | POA: Diagnosis not present

## 2016-05-09 DIAGNOSIS — Z139 Encounter for screening, unspecified: Secondary | ICD-10-CM | POA: Diagnosis not present

## 2016-05-09 DIAGNOSIS — E039 Hypothyroidism, unspecified: Secondary | ICD-10-CM | POA: Diagnosis not present

## 2016-08-24 DIAGNOSIS — Z853 Personal history of malignant neoplasm of breast: Secondary | ICD-10-CM | POA: Diagnosis not present

## 2016-08-24 DIAGNOSIS — I10 Essential (primary) hypertension: Secondary | ICD-10-CM | POA: Diagnosis not present

## 2016-08-24 DIAGNOSIS — Z9114 Patient's other noncompliance with medication regimen: Secondary | ICD-10-CM | POA: Diagnosis not present

## 2016-08-24 DIAGNOSIS — Z720 Tobacco use: Secondary | ICD-10-CM | POA: Diagnosis not present

## 2017-02-13 DIAGNOSIS — E039 Hypothyroidism, unspecified: Secondary | ICD-10-CM | POA: Diagnosis not present

## 2017-02-13 DIAGNOSIS — Z681 Body mass index (BMI) 19 or less, adult: Secondary | ICD-10-CM | POA: Diagnosis not present

## 2017-02-13 DIAGNOSIS — I10 Essential (primary) hypertension: Secondary | ICD-10-CM | POA: Diagnosis not present

## 2017-02-13 DIAGNOSIS — E785 Hyperlipidemia, unspecified: Secondary | ICD-10-CM | POA: Diagnosis not present

## 2017-02-13 DIAGNOSIS — R600 Localized edema: Secondary | ICD-10-CM | POA: Diagnosis not present

## 2017-02-13 DIAGNOSIS — F419 Anxiety disorder, unspecified: Secondary | ICD-10-CM | POA: Diagnosis not present

## 2017-02-13 DIAGNOSIS — Z86718 Personal history of other venous thrombosis and embolism: Secondary | ICD-10-CM | POA: Diagnosis not present

## 2017-02-13 DIAGNOSIS — Z720 Tobacco use: Secondary | ICD-10-CM | POA: Diagnosis not present

## 2017-02-13 DIAGNOSIS — E1140 Type 2 diabetes mellitus with diabetic neuropathy, unspecified: Secondary | ICD-10-CM | POA: Diagnosis not present

## 2017-02-13 DIAGNOSIS — L03115 Cellulitis of right lower limb: Secondary | ICD-10-CM | POA: Diagnosis not present

## 2017-03-13 DIAGNOSIS — Z139 Encounter for screening, unspecified: Secondary | ICD-10-CM | POA: Diagnosis not present

## 2017-03-13 DIAGNOSIS — E1140 Type 2 diabetes mellitus with diabetic neuropathy, unspecified: Secondary | ICD-10-CM | POA: Diagnosis not present

## 2017-03-13 DIAGNOSIS — Z720 Tobacco use: Secondary | ICD-10-CM | POA: Diagnosis not present

## 2017-03-13 DIAGNOSIS — F419 Anxiety disorder, unspecified: Secondary | ICD-10-CM | POA: Diagnosis not present

## 2017-03-13 DIAGNOSIS — E039 Hypothyroidism, unspecified: Secondary | ICD-10-CM | POA: Diagnosis not present

## 2017-03-13 DIAGNOSIS — E785 Hyperlipidemia, unspecified: Secondary | ICD-10-CM | POA: Diagnosis not present

## 2017-03-13 DIAGNOSIS — I10 Essential (primary) hypertension: Secondary | ICD-10-CM | POA: Diagnosis not present

## 2017-04-04 DIAGNOSIS — Z1231 Encounter for screening mammogram for malignant neoplasm of breast: Secondary | ICD-10-CM | POA: Diagnosis not present

## 2017-04-09 DIAGNOSIS — Z79811 Long term (current) use of aromatase inhibitors: Secondary | ICD-10-CM | POA: Diagnosis not present

## 2017-04-09 DIAGNOSIS — Z853 Personal history of malignant neoplasm of breast: Secondary | ICD-10-CM | POA: Diagnosis not present

## 2017-04-12 DIAGNOSIS — E039 Hypothyroidism, unspecified: Secondary | ICD-10-CM | POA: Diagnosis not present

## 2017-05-13 DIAGNOSIS — M79661 Pain in right lower leg: Secondary | ICD-10-CM | POA: Diagnosis not present

## 2017-05-13 DIAGNOSIS — M79671 Pain in right foot: Secondary | ICD-10-CM | POA: Diagnosis not present

## 2017-05-13 DIAGNOSIS — E039 Hypothyroidism, unspecified: Secondary | ICD-10-CM | POA: Diagnosis not present

## 2017-05-13 DIAGNOSIS — L03115 Cellulitis of right lower limb: Secondary | ICD-10-CM | POA: Diagnosis not present

## 2017-05-13 DIAGNOSIS — Z681 Body mass index (BMI) 19 or less, adult: Secondary | ICD-10-CM | POA: Diagnosis not present

## 2017-05-28 DIAGNOSIS — I70223 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, bilateral legs: Secondary | ICD-10-CM | POA: Diagnosis not present

## 2017-05-28 DIAGNOSIS — I739 Peripheral vascular disease, unspecified: Secondary | ICD-10-CM | POA: Diagnosis not present

## 2017-05-28 DIAGNOSIS — E1151 Type 2 diabetes mellitus with diabetic peripheral angiopathy without gangrene: Secondary | ICD-10-CM

## 2017-05-28 DIAGNOSIS — I70229 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, unspecified extremity: Secondary | ICD-10-CM | POA: Insufficient documentation

## 2017-05-28 HISTORY — DX: Type 2 diabetes mellitus with diabetic peripheral angiopathy without gangrene: E11.51

## 2017-05-28 HISTORY — DX: Peripheral vascular disease, unspecified: I73.9

## 2017-05-28 HISTORY — DX: 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, unspecified extremity: I70.229

## 2017-05-31 ENCOUNTER — Other Ambulatory Visit: Payer: Self-pay

## 2017-05-31 DIAGNOSIS — M79673 Pain in unspecified foot: Secondary | ICD-10-CM

## 2017-06-19 DIAGNOSIS — Z9181 History of falling: Secondary | ICD-10-CM | POA: Diagnosis not present

## 2017-06-19 DIAGNOSIS — I10 Essential (primary) hypertension: Secondary | ICD-10-CM | POA: Diagnosis not present

## 2017-06-19 DIAGNOSIS — E785 Hyperlipidemia, unspecified: Secondary | ICD-10-CM | POA: Diagnosis not present

## 2017-06-19 DIAGNOSIS — Z139 Encounter for screening, unspecified: Secondary | ICD-10-CM | POA: Diagnosis not present

## 2017-06-19 DIAGNOSIS — I739 Peripheral vascular disease, unspecified: Secondary | ICD-10-CM | POA: Diagnosis not present

## 2017-06-19 DIAGNOSIS — E039 Hypothyroidism, unspecified: Secondary | ICD-10-CM | POA: Diagnosis not present

## 2017-06-19 DIAGNOSIS — E1140 Type 2 diabetes mellitus with diabetic neuropathy, unspecified: Secondary | ICD-10-CM | POA: Diagnosis not present

## 2017-06-19 DIAGNOSIS — Z720 Tobacco use: Secondary | ICD-10-CM | POA: Diagnosis not present

## 2017-06-19 DIAGNOSIS — F419 Anxiety disorder, unspecified: Secondary | ICD-10-CM | POA: Diagnosis not present

## 2017-07-08 ENCOUNTER — Ambulatory Visit (INDEPENDENT_AMBULATORY_CARE_PROVIDER_SITE_OTHER): Payer: PPO | Admitting: Surgery

## 2017-07-08 ENCOUNTER — Encounter: Payer: Self-pay | Admitting: *Deleted

## 2017-07-08 ENCOUNTER — Other Ambulatory Visit: Payer: Self-pay | Admitting: *Deleted

## 2017-07-08 ENCOUNTER — Encounter: Payer: Self-pay | Admitting: Surgery

## 2017-07-08 ENCOUNTER — Ambulatory Visit (HOSPITAL_COMMUNITY)
Admission: RE | Admit: 2017-07-08 | Discharge: 2017-07-08 | Disposition: A | Discharge status: Home / Self Care | Payer: PPO | Source: Ambulatory Visit | Attending: Surgery | Admitting: Surgery

## 2017-07-08 VITALS — BP 165/75 | HR 70 | Temp 98.0°F | Resp 16 | Ht 62.0 in | Wt 107.0 lb

## 2017-07-08 DIAGNOSIS — I70213 Atherosclerosis of native arteries of extremities with intermittent claudication, bilateral legs: Secondary | ICD-10-CM

## 2017-07-08 DIAGNOSIS — I739 Peripheral vascular disease, unspecified: Secondary | ICD-10-CM | POA: Insufficient documentation

## 2017-07-08 DIAGNOSIS — M79661 Pain in right lower leg: Secondary | ICD-10-CM | POA: Insufficient documentation

## 2017-07-08 DIAGNOSIS — M79662 Pain in left lower leg: Secondary | ICD-10-CM | POA: Diagnosis not present

## 2017-07-08 DIAGNOSIS — M79673 Pain in unspecified foot: Secondary | ICD-10-CM | POA: Diagnosis not present

## 2017-07-08 NOTE — Progress Notes (Signed)
[facility] [place]  Patient name: [name] MRN: [id number] DOB: 01-08-1941 Sex: female   REASON FOR VISIT:    Follow up  HISOTRY OF PRESENT ILLNESS:    [name] is a 76 y.o. female who is a former patient of Dr. Kellie Simmering.  She is status post aortobifemoral bypass graft on 12/21/2010 using a 12 x 7 graft.  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  On 02/29/2012 patient went back to the operating room for a femoral endarterectomy with patch angioplasty.  She is here today stating that for the past 3 months she has been having severe leg pain that limits her walking.  The right leg bothers her much more than the left.  She does not have any open wounds.  The patient is type II diabetic.  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  She continues to smoke.  She is on ACE inhibitor for hypertension.  She takes a statin for hypercholesterolemia She currently takes care of her husband at home who has dementia.  PAST MEDICAL HISTORY:   Past Medical History:  Diagnosis Date  . Arthritis   . Blood transfusion 2011  . Cancer (HCC)    BREAST  . COPD (chronic obstructive pulmonary disease) ([place])   . Depression   . Diabetes mellitus    type 2 IDDM x 10 years  . GERD (gastroesophageal reflux disease)   . Hip pain   . Hypertension   . Hypothyroidism   . Peripheral vascular disease ([place])   . Sleep apnea    does not use cpap      FAMILY HISTORY:   Family History  Problem Relation Age of Onset  . Heart disease Mother   . Cancer Father        LUNG  . Cancer Sister        BONE  . Anesthesia problems Neg Hx     SOCIAL HISTORY:   Social History  Substance Use Topics  . Smoking status: Current Every Day Smoker    Packs/day: 1.00    Years: 50.00    Types: Cigarettes  . Smokeless tobacco: Never Used  . Alcohol use Not on file     ALLERGIES:   Allergies    Allergen Reactions  . Penicillins Swelling and Rash     CURRENT MEDICATIONS:   Current Outpatient Prescriptions  Medication Sig Dispense Refill  . anastrozole (ARIMIDEX) 1 MG tablet Take 1 mg by mouth daily.    Marland Kitchen aspirin EC 81 MG tablet Take 162 mg by mouth daily.    Marland Kitchen atenolol (TENORMIN) 100 MG tablet Take 100 mg by mouth daily.    . Calcium-Magnesium-Vitamin D (CALCIUM MAGNESIUM PO) Take 1 tablet by mouth 2 (two) times daily.    . fenofibrate 160 MG tablet Take 160 mg by mouth daily.    . fish oil-omega-3 fatty acids 1000 MG capsule Take 1 g by mouth 2 (two) times daily.    . Insulin Glargine (LANTUS SOLOSTAR) 100 UNIT/ML Solostar Pen Inject 45 Units into the skin daily at 10 pm. 5 pen 11  . Insulin Glulisine (APIDRA) 100 UNIT/ML Solostar Pen Inject 5-9 Units into the skin 3 (three) times daily before meals. 15 mL 11  . levothyroxine (SYNTHROID, LEVOTHROID) 125 MCG tablet Take 125 mcg by mouth daily.    Marland Kitchen lisinopril (PRINIVIL,ZESTRIL) 40 MG tablet Take 40 mg by mouth at bedtime.    Marland Kitchen losartan-hydrochlorothiazide (HYZAAR) 100-25  MG per tablet Take 1 tablet by mouth daily.    Marland Kitchen omeprazole (PRILOSEC) 20 MG capsule Take 20 mg by mouth 2 (two) times daily.    Marland Kitchen OVER THE COUNTER MEDICATION Take 1 tablet by mouth daily as needed. OTC Leg cramp tablet  As needed for leg cramps    . PARoxetine (PAXIL) 40 MG tablet Take 40 mg by mouth every morning.    Marland Kitchen rOPINIRole (REQUIP) 4 MG tablet Take 4 mg by mouth at bedtime.     No current facility-administered medications for this visit.     REVIEW OF SYSTEMS:   [X]  denotes positive finding, [ ]  denotes negative finding Cardiac  Comments:  Chest pain or chest pressure:    Shortness of breath upon exertion: x   Short of breath when lying flat:    Irregular heart rhythm:        Vascular    Pain in calf, thigh, or hip brought on by ambulation: x   Pain in feet at night that wakes you up from your sleep:  x   Blood clot in your veins: x   Leg  swelling:  x       Pulmonary    Oxygen at home:    Productive cough:     Wheezing:         Neurologic    Sudden weakness in arms or legs:  x   Sudden numbness in arms or legs:  x   Sudden onset of difficulty speaking or slurred speech:    Temporary loss of vision in one eye:     Problems with dizziness:  x       Gastrointestinal    Blood in stool:     Vomited blood:         Genitourinary    Burning when urinating:     Blood in urine:        Psychiatric    Major depression:         Hematologic    Bleeding problems:    Problems with blood clotting too easily:        Skin    Rashes or ulcers:        Constitutional    Fever or chills: x     PHYSICAL EXAM:   Vitals:   07/08/17 0932  BP: (!) 165/75  Pulse: 70  Resp: 16  Temp: 98 F (36.7 C)  SpO2: 100%  Weight: 107 lb (48.5 kg)  Height: 5\' 2"  (1.575 m)    GENERAL: The patient is a well-nourished female, in no acute distress. The vital signs are documented above. CARDIAC: There is a regular rate and rhythm.  VASCULAR: palpable femoral pulses bilaterally, non palpable pedal pulses PULMONARY: Non-labored respirations ABDOMEN: Soft and non-tender   MUSCULOSKELETAL: There are no major deformities or cyanosis. NEUROLOGIC: No focal weakness or paresthesias are detected. SKIN: There are no ulcers or rashes noted. PSYCHIATRIC: The patient has a normal affect.  STUDIES:   I have ordered and reviewed her ABIs.  On the right it measures 0.60 with a toe pressure of 27.  On the left measures 0.87 with toe pressure of 93  MEDICAL ISSUES:   Lower extremity vascular insufficiency: The patient has had a decline in her ABIs.  Previously the right was 1.0.  She has symptoms consistent with arterial insufficiency.  I discussed proceeding with a diagnostic arteriogram on Tuesday, September 25 in order to get a road map of her vascular disease.  I discussed that I would not proceed with intervention on that day but discussed the  most appropriate next step.    [name], MD [facility] [phone] Pager [phone]

## 2017-07-11 DIAGNOSIS — I70223 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, bilateral legs: Secondary | ICD-10-CM | POA: Diagnosis not present

## 2017-07-11 DIAGNOSIS — I739 Peripheral vascular disease, unspecified: Secondary | ICD-10-CM | POA: Diagnosis not present

## 2017-07-11 DIAGNOSIS — E1151 Type 2 diabetes mellitus with diabetic peripheral angiopathy without gangrene: Secondary | ICD-10-CM | POA: Diagnosis not present

## 2017-07-16 ENCOUNTER — Telehealth: Payer: Self-pay | Admitting: Surgery

## 2017-07-16 ENCOUNTER — Ambulatory Visit (HOSPITAL_COMMUNITY)
Admission: RE | Admit: 2017-07-16 | Discharge: 2017-07-16 | Disposition: A | Discharge status: Home / Self Care | Payer: PPO | Source: Ambulatory Visit | Attending: Surgery | Admitting: Surgery

## 2017-07-16 ENCOUNTER — Encounter (HOSPITAL_COMMUNITY): Payer: Self-pay | Admitting: Surgery

## 2017-07-16 ENCOUNTER — Ambulatory Visit (HOSPITAL_COMMUNITY)
Admission: RE | Disposition: A | Discharge status: Home / Self Care | Payer: Self-pay | Source: Ambulatory Visit | Attending: Surgery

## 2017-07-16 DIAGNOSIS — I70211 Atherosclerosis of native arteries of extremities with intermittent claudication, right leg: Secondary | ICD-10-CM | POA: Insufficient documentation

## 2017-07-16 DIAGNOSIS — E7800 Pure hypercholesterolemia, unspecified: Secondary | ICD-10-CM | POA: Insufficient documentation

## 2017-07-16 DIAGNOSIS — F17210 Nicotine dependence, cigarettes, uncomplicated: Secondary | ICD-10-CM | POA: Insufficient documentation

## 2017-07-16 DIAGNOSIS — Z880 Allergy status to penicillin: Secondary | ICD-10-CM | POA: Diagnosis not present

## 2017-07-16 DIAGNOSIS — Z7982 Long term (current) use of aspirin: Secondary | ICD-10-CM | POA: Diagnosis not present

## 2017-07-16 DIAGNOSIS — J449 Chronic obstructive pulmonary disease, unspecified: Secondary | ICD-10-CM | POA: Insufficient documentation

## 2017-07-16 DIAGNOSIS — I10 Essential (primary) hypertension: Secondary | ICD-10-CM | POA: Insufficient documentation

## 2017-07-16 DIAGNOSIS — Z794 Long term (current) use of insulin: Secondary | ICD-10-CM | POA: Insufficient documentation

## 2017-07-16 DIAGNOSIS — K219 Gastro-esophageal reflux disease without esophagitis: Secondary | ICD-10-CM | POA: Diagnosis not present

## 2017-07-16 DIAGNOSIS — E039 Hypothyroidism, unspecified: Secondary | ICD-10-CM | POA: Insufficient documentation

## 2017-07-16 DIAGNOSIS — F329 Major depressive disorder, single episode, unspecified: Secondary | ICD-10-CM | POA: Diagnosis not present

## 2017-07-16 DIAGNOSIS — E1151 Type 2 diabetes mellitus with diabetic peripheral angiopathy without gangrene: Secondary | ICD-10-CM | POA: Insufficient documentation

## 2017-07-16 DIAGNOSIS — M79604 Pain in right leg: Secondary | ICD-10-CM | POA: Diagnosis present

## 2017-07-16 DIAGNOSIS — G4730 Sleep apnea, unspecified: Secondary | ICD-10-CM | POA: Insufficient documentation

## 2017-07-16 DIAGNOSIS — M1990 Unspecified osteoarthritis, unspecified site: Secondary | ICD-10-CM | POA: Diagnosis not present

## 2017-07-16 HISTORY — PX: ABDOMINAL AORTOGRAM: CATH118222

## 2017-07-16 HISTORY — PX: LOWER EXTREMITY ANGIOGRAPHY: CATH118251

## 2017-07-16 LAB — POCT I-STAT, CHEM 8
BUN: 3 mg/dL — AB (ref 6–20)
CHLORIDE: 98 mmol/L — AB (ref 101–111)
Calcium, Ion: 1.09 mmol/L — ABNORMAL LOW (ref 1.15–1.40)
Creatinine, Ser: 0.7 mg/dL (ref 0.44–1.00)
Glucose, Bld: 168 mg/dL — ABNORMAL HIGH (ref 65–99)
HEMATOCRIT: 41 % (ref 36.0–46.0)
Hemoglobin: 13.9 g/dL (ref 12.0–15.0)
Potassium: 3.4 mmol/L — ABNORMAL LOW (ref 3.5–5.1)
SODIUM: 142 mmol/L (ref 135–145)
TCO2: 31 mmol/L (ref 22–32)

## 2017-07-16 LAB — GLUCOSE, CAPILLARY: Glucose-Capillary: 125 mg/dL — ABNORMAL HIGH (ref 65–99)

## 2017-07-16 SURGERY — ABDOMINAL AORTOGRAM
Anesthesia: LOCAL

## 2017-07-16 MED ORDER — SODIUM CHLORIDE 0.9 % IV SOLN: 250.0000 mL | INTRAVENOUS | Status: DC | PRN

## 2017-07-16 MED ORDER — HYDRALAZINE HCL 20 MG/ML IJ SOLN
5.0000 mg | INTRAMUSCULAR | Status: AC | PRN
  Administered 2017-07-16 (×2): 5 mg via INTRAVENOUS

## 2017-07-16 MED ORDER — ASPIRIN EC 81 MG PO TBEC
81.0000 mg | DELAYED_RELEASE_TABLET | Freq: Every day | ORAL | Status: DC
  Filled 2017-07-16: qty 1

## 2017-07-16 MED ORDER — FENTANYL CITRATE (PF) 100 MCG/2ML IJ SOLN
INTRAMUSCULAR | Status: AC
  Filled 2017-07-16: qty 2

## 2017-07-16 MED ORDER — SODIUM CHLORIDE 0.9 % IV SOLN
INTRAVENOUS | Status: DC
  Administered 2017-07-16: 08:00:00 via INTRAVENOUS

## 2017-07-16 MED ORDER — MIDAZOLAM HCL 2 MG/2ML IJ SOLN
INTRAMUSCULAR | Status: DC | PRN
  Administered 2017-07-16 (×2): 1 mg via INTRAVENOUS

## 2017-07-16 MED ORDER — LIDOCAINE HCL (PF) 1 % IJ SOLN
INTRAMUSCULAR | Status: DC | PRN
  Administered 2017-07-16: 10 mL

## 2017-07-16 MED ORDER — HYDRALAZINE HCL 20 MG/ML IJ SOLN
INTRAMUSCULAR | Status: AC
  Filled 2017-07-16: qty 1

## 2017-07-16 MED ORDER — LIDOCAINE HCL 2 % IJ SOLN
INTRAMUSCULAR | Status: AC
  Filled 2017-07-16: qty 10

## 2017-07-16 MED ORDER — SODIUM CHLORIDE 0.9 % WEIGHT BASED INFUSION: 1.0000 mL/kg/h | INTRAVENOUS | Status: DC

## 2017-07-16 MED ORDER — HEPARIN (PORCINE) IN NACL 2-0.9 UNIT/ML-% IJ SOLN
INTRAMUSCULAR | Status: AC | PRN
  Administered 2017-07-16: 1000 mL

## 2017-07-16 MED ORDER — LABETALOL HCL 5 MG/ML IV SOLN: 10.0000 mg | INTRAVENOUS | Status: DC | PRN

## 2017-07-16 MED ORDER — MIDAZOLAM HCL 2 MG/2ML IJ SOLN
INTRAMUSCULAR | Status: AC
  Filled 2017-07-16: qty 2

## 2017-07-16 MED ORDER — HEPARIN (PORCINE) IN NACL 2-0.9 UNIT/ML-% IJ SOLN
INTRAMUSCULAR | Status: AC
  Filled 2017-07-16: qty 1000

## 2017-07-16 MED ORDER — IODIXANOL 320 MG/ML IV SOLN
INTRAVENOUS | Status: DC | PRN
  Administered 2017-07-16: 150 mL via INTRAVENOUS

## 2017-07-16 MED ORDER — ASPIRIN 81 MG PO CHEW
CHEWABLE_TABLET | ORAL | Status: AC
  Administered 2017-07-16: 81 mg
  Filled 2017-07-16: qty 1

## 2017-07-16 MED ORDER — GABAPENTIN 100 MG PO CAPS: 100.0000 mg | ORAL_CAPSULE | Freq: Two times a day (BID) | ORAL | 10 refills | Status: DC

## 2017-07-16 MED ORDER — FENTANYL CITRATE (PF) 100 MCG/2ML IJ SOLN
INTRAMUSCULAR | Status: DC | PRN
Start: 2017-07-16 — End: 2017-07-16
  Administered 2017-07-16 (×2): 25 ug via INTRAVENOUS

## 2017-07-16 MED ORDER — SODIUM CHLORIDE 0.9% FLUSH: 3.0000 mL | INTRAVENOUS | Status: DC | PRN

## 2017-07-16 MED ORDER — SODIUM CHLORIDE 0.9% FLUSH: 3.0000 mL | Freq: Two times a day (BID) | INTRAVENOUS | Status: DC

## 2017-07-16 SURGICAL SUPPLY — 11 items
CATH OMNI FLUSH 5F 65CM (CATHETERS) ×1 IMPLANT
CATH SOFTOUCH MOTARJEME 5F (CATHETERS) ×1 IMPLANT
COVER PRB 48X5XTLSCP FOLD TPE (BAG) IMPLANT
COVER PROBE 5X48 (BAG) ×3
KIT MICROINTRODUCER STIFF 5F (SHEATH) ×1 IMPLANT
KIT PV (KITS) ×3 IMPLANT
SHEATH PINNACLE 5F 10CM (SHEATH) ×1 IMPLANT
SYR MEDRAD MARK V 150ML (SYRINGE) ×3 IMPLANT
TRANSDUCER W/STOPCOCK (MISCELLANEOUS) ×3 IMPLANT
TRAY PV CATH (CUSTOM PROCEDURE TRAY) ×3 IMPLANT
WIRE BENTSON .035X145CM (WIRE) ×1 IMPLANT

## 2017-07-16 NOTE — Telephone Encounter (Signed)
Sched appt 08/05/17; ab at 11:00 and MD at 11:30. Lm on hm#.

## 2017-07-16 NOTE — Interval H&P Note (Signed)
History and Physical Interval Note:  07/16/2017 7:52 AM  [name]  has presented today for surgery, with the diagnosis of pvd - claudication  The various methods of treatment have been discussed with the patient and family. After consideration of risks, benefits and other options for treatment, the patient has consented to  Procedure(s): ABDOMINAL AORTOGRAM W/LOWER EXTREMITY (N/A) as a surgical intervention .  The patient's history has been reviewed, patient examined, no change in status, stable for surgery.  I have reviewed the patient's chart and labs.  Questions were answered to the patient's satisfaction.     [name]

## 2017-07-16 NOTE — Discharge Instructions (Signed)
Angiogram, Care After °This sheet gives you information about how to care for yourself after your procedure. Your health care provider may also give you more specific instructions. If you have problems or questions, contact your health care provider. °What can I expect after the procedure? °After the procedure, it is common to have bruising and tenderness at the catheter insertion area. °Follow these instructions at home: °Insertion site care  °· Follow instructions from your health care provider about how to take care of your insertion site. Make sure you: °¨ Wash your hands with soap and water before you change your bandage (dressing). If soap and water are not available, use hand sanitizer. °¨ Change your dressing as told by your health care provider. °¨ Leave stitches (sutures), skin glue, or adhesive strips in place. These skin closures may need to stay in place for 2 weeks or longer. If adhesive strip edges start to loosen and curl up, you may trim the loose edges. Do not remove adhesive strips completely unless your health care provider tells you to do that. °· Do not take baths, swim, or use a hot tub until your health care provider approves. °· You may shower 24-48 hours after the procedure or as told by your health care provider. °¨ Gently wash the site with plain soap and water. °¨ Pat the area dry with a clean towel. °¨ Do not rub the site. This may cause bleeding. °· Do not apply powder or lotion to the site. Keep the site clean and dry. °· Check your insertion site every day for signs of infection. Check for: °¨ Redness, swelling, or pain. °¨ Fluid or blood. °¨ Warmth. °¨ Pus or a bad smell. °Activity  °· Rest as told by your health care provider, usually for 1-2 days. °· Do not lift anything that is heavier than 10 lbs. (4.5 kg) or as told by your health care provider. °· Do not drive for 24 hours if you were given a medicine to help you relax (sedative). °· Do not drive or use heavy machinery while  taking prescription pain medicine. °General instructions  °· Return to your normal activities as told by your health care provider, usually in about a week. Ask your health care provider what activities are safe for you. °· If the catheter site starts bleeding, lie flat and put pressure on the site. If the bleeding does not stop, get help right away. This is a medical emergency. °· Drink enough fluid to keep your urine clear or pale yellow. This helps flush the contrast dye from your body. °· Take over-the-counter and prescription medicines only as told by your health care provider. °· Keep all follow-up visits as told by your health care provider. This is important. °Contact a health care provider if: °· You have a fever or chills. °· You have redness, swelling, or pain around your insertion site. °· You have fluid or blood coming from your insertion site. °· The insertion site feels warm to the touch. °· You have pus or a bad smell coming from your insertion site. °· You have bruising around the insertion site. °· You notice blood collecting in the tissue around the catheter site (hematoma). The hematoma may be painful to the touch. °Get help right away if: °· You have severe pain at the catheter insertion area. °· The catheter insertion area swells very fast. °· The catheter insertion area is bleeding, and the bleeding does not stop when you hold steady pressure on   the area. °· The area near or just beyond the catheter insertion site becomes pale, cool, tingly, or numb. °These symptoms may represent a serious problem that is an emergency. Do not wait to see if the symptoms will go away. Get medical help right away. Call your local emergency services (911 in the [place] Do not drive yourself to the hospital. °Summary °· After the procedure, it is common to have bruising and tenderness at the catheter insertion area. °· After the procedure, it is important to rest and drink plenty of fluids. °· Do not take baths,  swim, or use a hot tub until your health care provider says it is okay to do so. You may shower 24-48 hours after the procedure or as told by your health care provider. °· If the catheter site starts bleeding, lie flat and put pressure on the site. If the bleeding does not stop, get help right away. This is a medical emergency. °This information is not intended to replace advice given to you by your health care provider. Make sure you discuss any questions you have with your health care provider. °Document Released: 04/26/2005 Document Revised: 09/12/2016 Document Reviewed: 09/12/2016 °[employer]vier Interactive Patient Education © 2017 [employer] ° °

## 2017-07-16 NOTE — H&P (View-Only) (Signed)
[facility]in Specialist of [place]  Patient name: [name] MRN: [id number] DOB: Dec 21, 1940 Sex: female   REASON FOR VISIT:    Follow up  HISOTRY OF PRESENT ILLNESS:    [name] is a 76 y.o. female who is a former patient of Dr. Kellie Simmering.  She is status post aortobifemoral bypass graft on 12/21/2010 using a 12 x 7 graft.  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  On 02/29/2012 patient went back to the operating room for a femoral endarterectomy with patch angioplasty.  She is here today stating that for the past 3 months she has been having severe leg pain that limits her walking.  The right leg bothers her much more than the left.  She does not have any open wounds.  The patient is type II diabetic.  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  She continues to smoke.  She is on ACE inhibitor for hypertension.  She takes a statin for hypercholesterolemia She currently takes care of her husband at home who has dementia.  PAST MEDICAL HISTORY:   Past Medical History:  Diagnosis Date  . Arthritis   . Blood transfusion 2011  . Cancer (HCC)    BREAST  . COPD (chronic obstructive pulmonary disease) ([place])   . Depression   . Diabetes mellitus    type 2 IDDM x 10 years  . GERD (gastroesophageal reflux disease)   . Hip pain   . Hypertension   . Hypothyroidism   . Peripheral vascular disease ([place])   . Sleep apnea    does not use cpap      FAMILY HISTORY:   Family History  Problem Relation Age of Onset  . Heart disease Mother   . Cancer Father        LUNG  . Cancer Sister        BONE  . Anesthesia problems Neg Hx     SOCIAL HISTORY:   Social History  Substance Use Topics  . Smoking status: Current Every Day Smoker    Packs/day: 1.00    Years: 50.00    Types: Cigarettes  . Smokeless tobacco: Never Used  . Alcohol use Not on file     ALLERGIES:   Allergies    Allergen Reactions  . Penicillins Swelling and Rash     CURRENT MEDICATIONS:   Current Outpatient Prescriptions  Medication Sig Dispense Refill  . anastrozole (ARIMIDEX) 1 MG tablet Take 1 mg by mouth daily.    Marland Kitchen aspirin EC 81 MG tablet Take 162 mg by mouth daily.    Marland Kitchen atenolol (TENORMIN) 100 MG tablet Take 100 mg by mouth daily.    . Calcium-Magnesium-Vitamin D (CALCIUM MAGNESIUM PO) Take 1 tablet by mouth 2 (two) times daily.    . fenofibrate 160 MG tablet Take 160 mg by mouth daily.    . fish oil-omega-3 fatty acids 1000 MG capsule Take 1 g by mouth 2 (two) times daily.    . Insulin Glargine (LANTUS SOLOSTAR) 100 UNIT/ML Solostar Pen Inject 45 Units into the skin daily at 10 pm. 5 pen 11  . Insulin Glulisine (APIDRA) 100 UNIT/ML Solostar Pen Inject 5-9 Units into the skin 3 (three) times daily before meals. 15 mL 11  . levothyroxine (SYNTHROID, LEVOTHROID) 125 MCG tablet Take 125 mcg by mouth daily.    Marland Kitchen lisinopril (PRINIVIL,ZESTRIL) 40 MG tablet Take 40 mg by mouth at bedtime.    Marland Kitchen losartan-hydrochlorothiazide (HYZAAR) 100-25  MG per tablet Take 1 tablet by mouth daily.    Marland Kitchen omeprazole (PRILOSEC) 20 MG capsule Take 20 mg by mouth 2 (two) times daily.    Marland Kitchen OVER THE COUNTER MEDICATION Take 1 tablet by mouth daily as needed. OTC Leg cramp tablet  As needed for leg cramps    . PARoxetine (PAXIL) 40 MG tablet Take 40 mg by mouth every morning.    Marland Kitchen rOPINIRole (REQUIP) 4 MG tablet Take 4 mg by mouth at bedtime.     No current facility-administered medications for this visit.     REVIEW OF SYSTEMS:   [X]  denotes positive finding, [ ]  denotes negative finding Cardiac  Comments:  Chest pain or chest pressure:    Shortness of breath upon exertion: x   Short of breath when lying flat:    Irregular heart rhythm:        Vascular    Pain in calf, thigh, or hip brought on by ambulation: x   Pain in feet at night that wakes you up from your sleep:  x   Blood clot in your veins: x   Leg  swelling:  x       Pulmonary    Oxygen at home:    Productive cough:     Wheezing:         Neurologic    Sudden weakness in arms or legs:  x   Sudden numbness in arms or legs:  x   Sudden onset of difficulty speaking or slurred speech:    Temporary loss of vision in one eye:     Problems with dizziness:  x       Gastrointestinal    Blood in stool:     Vomited blood:         Genitourinary    Burning when urinating:     Blood in urine:        Psychiatric    Major depression:         Hematologic    Bleeding problems:    Problems with blood clotting too easily:        Skin    Rashes or ulcers:        Constitutional    Fever or chills: x     PHYSICAL EXAM:   Vitals:   07/08/17 0932  BP: (!) 165/75  Pulse: 70  Resp: 16  Temp: 98 F (36.7 C)  SpO2: 100%  Weight: 107 lb (48.5 kg)  Height: 5\' 2"  (1.575 m)    GENERAL: The patient is a well-nourished female, in no acute distress. The vital signs are documented above. CARDIAC: There is a regular rate and rhythm.  VASCULAR: palpable femoral pulses bilaterally, non palpable pedal pulses PULMONARY: Non-labored respirations ABDOMEN: Soft and non-tender   MUSCULOSKELETAL: There are no major deformities or cyanosis. NEUROLOGIC: No focal weakness or paresthesias are detected. SKIN: There are no ulcers or rashes noted. PSYCHIATRIC: The patient has a normal affect.  STUDIES:   I have ordered and reviewed her ABIs.  On the right it measures 0.60 with a toe pressure of 27.  On the left measures 0.87 with toe pressure of 93  MEDICAL ISSUES:   Lower extremity vascular insufficiency: The patient has had a decline in her ABIs.  Previously the right was 1.0.  She has symptoms consistent with arterial insufficiency.  I discussed proceeding with a diagnostic arteriogram on Tuesday, September 25 in order to get a road map of her vascular disease.  I discussed that I would not proceed with intervention on that day but discussed the  most appropriate next step.    [name], MD [facility] [phone] Pager [phone]

## 2017-07-16 NOTE — Telephone Encounter (Signed)
-----   Message from Mena Goes, RN sent at 07/16/2017  9:33 AM EDT ----- Regarding: 1-2 weeks w/ GSV vein mapping   ----- Message ----- From: Serafina Mitchell, MD Sent: 07/16/2017   9:01 AM To: Vvs Charge Pool  03/15/2017:  Surgeon:  [name] Procedure Performed:  1.  Ultrasound-guided access, left femoral artery  2.  Abdominal aortogram  3.  Bilateral lower extremity runoff  4.  Second order catheterization  5.  Patient (27 minutes)   Follow-up 12 weeks with a lower extremity vein mapping

## 2017-07-16 NOTE — Progress Notes (Signed)
Site area: left groin fa sheath Site Prior to Removal:  Level 0 Pressure Applied For: 20 minutes Manual:   yes Patient Status During Pull:  stable Post Pull Site:  Level 0 Post Pull Instructions Given:  yes Post Pull Pulses Present: dopplered Dressing Applied:  Gauze and tegaderm Bedrest begins @  0998 Comments:

## 2017-07-16 NOTE — Op Note (Addendum)
    Patient name: [name] MRN: [id number] DOB: [birth year] Sex: female  07/16/2017 Pre-operative Diagnosis: Right leg pain Post-operative diagnosis:  Same Surgeon:  [name] Procedure Performed:  1.  Ultrasound-guided access, left femoral artery  2.  Abdominal aortogram  3.  Bilateral lower extremity runoff  4.  First order catheterization  5.  Patient (27 minutes)    Indications:  The patient is having difficulty with pain in her right leg.  She comes in today for further evaluation.  Procedure:  The patient was identified in the holding area and taken to room 8.  The patient was then placed supine on the table and prepped and draped in the usual sterile fashion.  A time out was called.  Conscious sedation was administered with the use of IV fentanyl and Versed in a continuous physician and nurse monitoring.  Heart rate, blood pressure, and oxygen saturation were continuously monitored.  Ultrasound was used to evaluate the left common femoral artery.  It was patent .  A digital ultrasound image was acquired.  A micropuncture needle was used to access the left common femoral artery under ultrasound guidance.  An 018 wire was advanced without resistance and a micropuncture sheath was placed.  The 018 wire was removed and a benson wire was placed.  The micropuncture sheath was exchanged for a 5 french sheath.  An omniflush catheter was advanced over the wire to the level of L-1.  An abdominal angiogram was obtained. Next, a pelvic angiogram was performed.  I then did bilateral runoff.  In order to get better images of the right leg, I placed a reverse curve catheter into the right iliac artery   Findings:   Aortogram:  No significant renal artery stenosis.  The aortic to bifemoral bypass graft is widely patent bilateral femoral anastomoses are widely patent.  Right Lower Extremity:  Arnell Sieving course without significant stenosis.  The aortic to common femoral anastomosis is widely patent.   There is multilevel stenosis throughout the superficial femoral artery with near total occlusion.  The popliteal artery is patent throughout it's course.  2 vessel runoff via the anterior tibial and peroneal artery.  Left Lower Extremity:  The left common femoral profunda femoral artery are patent throughout their course.  The superficial femoral artery and popliteal artery are patent with mild-to-moderate disease.  There is two-vessel runoff.  Intervention:  None  Impression:  #1  multiple high-grade lesions within the right superficial femoral artery.  The patient will be brought back for discussions regarding surgical versus percutaneous revascularization  #2  patent aortobifemoral bypass graft.  Theotis Burrow, M.D. [facility] [place] Office: [phone] Pager:  [phone]

## 2017-07-17 MED FILL — Lidocaine HCl Local Inj 2%: INTRAMUSCULAR | Qty: 10 | Status: AC

## 2017-07-18 DIAGNOSIS — E039 Hypothyroidism, unspecified: Secondary | ICD-10-CM | POA: Diagnosis not present

## 2017-07-18 DIAGNOSIS — N183 Chronic kidney disease, stage 3 (moderate): Secondary | ICD-10-CM | POA: Diagnosis not present

## 2017-07-24 NOTE — Addendum Note (Signed)
Addended by: Lianne Cure A on: 07/24/2017 03:00 PM   Modules accepted: Orders

## 2017-08-05 ENCOUNTER — Ambulatory Visit (HOSPITAL_COMMUNITY)
Admission: RE | Admit: 2017-08-05 | Discharge: 2017-08-05 | Disposition: A | Discharge status: Home / Self Care | Payer: PPO | Source: Ambulatory Visit | Attending: Surgery | Admitting: Surgery

## 2017-08-05 ENCOUNTER — Encounter: Payer: Self-pay | Admitting: Surgery

## 2017-08-05 ENCOUNTER — Ambulatory Visit (INDEPENDENT_AMBULATORY_CARE_PROVIDER_SITE_OTHER): Payer: PPO | Admitting: Surgery

## 2017-08-05 VITALS — BP 156/70 | HR 76 | Temp 97.0°F | Resp 20 | Ht 64.0 in | Wt 107.0 lb

## 2017-08-05 DIAGNOSIS — I70213 Atherosclerosis of native arteries of extremities with intermittent claudication, bilateral legs: Secondary | ICD-10-CM

## 2017-08-05 DIAGNOSIS — Z01818 Encounter for other preprocedural examination: Secondary | ICD-10-CM | POA: Insufficient documentation

## 2017-08-05 NOTE — Progress Notes (Signed)
Vascular and Vein Specialist of [place]  Patient name: [name] MRN: [id number] DOB: 09-08-1941 Sex: female   REASON FOR VISIT:    Follow up  HISOTRY OF PRESENT ILLNESS:   [name] is a 76 y.o. female who is a former patient of Dr. Kellie Simmering.  She is status post aortobifemoral bypass graft on 12/21/2010 using a 12 x 7 graft.  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  On 02/29/2012 patient went back to the operating room for a femoral endarterectomy with patch angioplasty.  She is here today stating that for the past 3 months she has been having severe leg pain that limits her walking.  The right leg bothers her much more than the left.  She does not have any open wounds.  She is status post angiography and September 2018 because of a significant drop in her ABIs.  This study showed that the aortobifemoral bypass graft was widely patent.  She had diffuse disease throughout her right superficial femoral popliteal arteries.  The patient is type II diabetic.  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  She continues to smoke.  She is on ACE inhibitor for hypertension.  She takes a statin for hypercholesterolemia She currently takes care of her husband at home who has dementia.   PAST MEDICAL HISTORY:   Past Medical History:  Diagnosis Date  . Arthritis   . Blood transfusion 2011  . Cancer (HCC)    BREAST  . COPD (chronic obstructive pulmonary disease) ([place])   . Depression   . Diabetes mellitus    type 2 IDDM x 10 years  . GERD (gastroesophageal reflux disease)   . Hip pain   . Hypertension   . Hypothyroidism   . Peripheral vascular disease ([place])   . Sleep apnea    does not use cpap      FAMILY HISTORY:   Family History  Problem Relation Age of Onset  . Heart disease Mother   . Cancer Father        LUNG  . Cancer Sister        BONE  . Anesthesia problems Neg Hx      SOCIAL HISTORY:   Social History  Substance Use Topics  . Smoking status: Current Every Day Smoker    Packs/day: 1.50    Years: 50.00    Types: Cigarettes  . Smokeless tobacco: Never Used  . Alcohol use Not on file     ALLERGIES:   Allergies  Allergen Reactions  . Penicillins Swelling and Rash    Has patient had a PCN reaction causing immediate rash, facial/tongue/throat swelling, SOB or lightheadedness with hypotension: No Has patient had a PCN reaction causing severe rash involving mucus membranes or skin necrosis: No Has patient had a PCN reaction that required hospitalization: No Has patient had a PCN reaction occurring within the last 10 years:No If all of the above answers are "NO", then may proceed with Cephalosporin use.      CURRENT MEDICATIONS:   Current Outpatient Prescriptions  Medication Sig Dispense Refill  . acetaminophen (TYLENOL) 325 MG tablet Take 650 mg by mouth every 6 (six) hours as needed (for pain.).    Marland Kitchen alendronate (FOSAMAX) 70 MG tablet Take 70 mg by mouth every Friday.  5  . ALPRAZolam (XANAX) 0.5 MG tablet Take 0.5 mg by mouth at bedtime as needed. For sleep  0  . amLODipine-benazepril (LOTREL) 5-40 MG capsule Take 1 capsule  by mouth at bedtime.  5  . anastrozole (ARIMIDEX) 1 MG tablet Take 1 mg by mouth daily.    Marland Kitchen atenolol (TENORMIN) 100 MG tablet Take 100 mg by mouth daily.    Marland Kitchen atorvastatin (LIPITOR) 40 MG tablet Take 40 mg by mouth at bedtime.  5  . Calcium-Magnesium-Vitamin D (CALCIUM MAGNESIUM PO) Take 1 tablet by mouth 2 (two) times daily.    . Cholecalciferol (VITAMIN D3 PO) Take 1 tablet by mouth 2 (two) times daily.    . fenofibrate 160 MG tablet Take 160 mg by mouth daily.    . fish oil-omega-3 fatty acids 1000 MG capsule Take 1 g by mouth 2 (two) times daily.    Marland Kitchen gabapentin (NEURONTIN) 100 MG capsule Take 1 capsule (100 mg total) by mouth 2 (two) times daily. 1 capsule 10  . Insulin Glargine (LANTUS SOLOSTAR) 100 UNIT/ML  Solostar Pen Inject 45 Units into the skin daily at 10 pm. (Patient taking differently: Inject 30-40 Units into the skin daily at 10 pm. ) 5 pen 11  . lansoprazole (PREVACID) 30 MG capsule Take 30 mg by mouth 2 (two) times daily.  5  . levothyroxine (SYNTHROID, LEVOTHROID) 125 MCG tablet Take 125 mcg by mouth daily before breakfast.     . losartan (COZAAR) 100 MG tablet Take 100 mg by mouth daily with breakfast.  0  . PARoxetine (PAXIL) 40 MG tablet Take 40 mg by mouth daily.     Marland Kitchen rOPINIRole (REQUIP) 4 MG tablet Take 4 mg by mouth at bedtime.     No current facility-administered medications for this visit.     REVIEW OF SYSTEMS:   [X]  denotes positive finding, [ ]  denotes negative finding Cardiac  Comments:  Chest pain or chest pressure:    Shortness of breath upon exertion:    Short of breath when lying flat:    Irregular heart rhythm:        Vascular    Pain in calf, thigh, or hip brought on by ambulation: x   Pain in feet at night that wakes you up from your sleep:     Blood clot in your veins:    Leg swelling:  x       Pulmonary    Oxygen at home:    Productive cough:     Wheezing:         Neurologic    Sudden weakness in arms or legs:     Sudden numbness in arms or legs:     Sudden onset of difficulty speaking or slurred speech:    Temporary loss of vision in one eye:     Problems with dizziness:         Gastrointestinal    Blood in stool:     Vomited blood:         Genitourinary    Burning when urinating:     Blood in urine:        Psychiatric    Major depression:         Hematologic    Bleeding problems:    Problems with blood clotting too easily:        Skin    Rashes or ulcers:        Constitutional    Fever or chills:      PHYSICAL EXAM:   Vitals:   08/05/17 1151  BP: (!) 156/70  Pulse: 76  Resp: 20  Temp: (!) 97 F (36.1 C)  TempSrc: Oral  SpO2: 99%  Weight: 107 lb (48.5 kg)  Height: 5\' 4"  (1.626 m)    GENERAL: The patient is a  well-nourished female, in no acute distress. The vital signs are documented above. CARDIAC: There is a regular rate and rhythm.  VASCULAR: Palpable femoral pulses, nonpalpable pedal pulses. PULMONARY: Non-labored respirations MUSCULOSKELETAL: There are no major deformities or cyanosis. NEUROLOGIC: No focal weakness or paresthesias are detected. SKIN: There are no ulcers or rashes noted. PSYCHIATRIC: The patient has a normal affect.  STUDIES:   Vein mapping was done today.  This shows a saphenous vein on the right measuring 0.2-0.34 and a slightly better on the left.  MEDICAL ISSUES:   Claudication: The patient has some symptoms that are not exactly in line with claudication, such as pain and swelling and a bulge on the lateral side of her leg and night.  She did appear to have some relief from cilostazol, however she forgot to continue taking it.  I have recommended starting smoking cessation with Chantix, which she will get her primary care physician.  We also discussed the details of an exercise program and to implement this starting immediately.  She is going to follow-up with me in 6 weeks to see her progress.  I told her that because of her smoking, the small size of her vessels, that she probably will not have a great results from a percutaneous intervention.  I believe that she would also require a below knee popliteal bypass graft for her symptoms and I'm not sure I want to do that for claudication.  Hopefully with the changes suggested above she will be improved and can avoid needing surgery.    [name], MD [facility] of [facility] [phone] Pager [phone]

## 2017-09-16 ENCOUNTER — Ambulatory Visit (INDEPENDENT_AMBULATORY_CARE_PROVIDER_SITE_OTHER): Payer: PPO | Admitting: Surgery

## 2017-09-16 ENCOUNTER — Encounter: Payer: Self-pay | Admitting: Surgery

## 2017-09-16 VITALS — BP 156/85 | HR 66 | Temp 97.1°F | Resp 16 | Ht 64.0 in | Wt 106.0 lb

## 2017-09-16 DIAGNOSIS — I70213 Atherosclerosis of native arteries of extremities with intermittent claudication, bilateral legs: Secondary | ICD-10-CM

## 2017-09-16 NOTE — Progress Notes (Signed)
/                                     [facility] [place]  Patient name: [name] MRN: [id number] DOB: 06/21/1941 Sex: female   REASON FOR VISIT:    Follow up  HISOTRY OF PRESENT ILLNESS:    [name] a 76 y.o.femalewho is a former patient of Dr. Kellie Simmering. She is status post aortobifemoral bypass graft on 12/21/2010 using a 12 x 7 graft. 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 On 02/29/2012 patient went back to the operating room for a femoral endarterectomy with patch angioplasty. She is here today stating that for the past 3 months she has been having severe leg pain that limits her walking. The right leg bothers her much more than the left. She does not have any open wounds.  She is status post angiography and September 2018 because of a significant drop in her ABIs.  This study showed that the aortobifemoral bypass graft was widely patent.  She had diffuse disease throughout her right superficial femoral popliteal arteries.    At her last visit I recommended smoking cessation.  We also discussed an exercise program and cilostazol.  She is back today to discuss the results of these interventions.  I was reluctant to offer her a below-knee popliteal bypass graft for claudication.  She will occasionally wake up in the middle the night with pain in her feet.  She does suffer from the inability to walk very far however this is not lifestyle limiting.  She does not have any nonhealing wounds.  She has not been able to quit smoking.  The patient is type II diabetic. 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 She continues to smoke. She is on ACE inhibitor for hypertension. She takes a statin for hypercholesterolemia She currently takes care of her husband at home who has dementia.   PAST MEDICAL HISTORY:   Past Medical History:  Diagnosis Date  . Arthritis   . Blood transfusion 2011  .  Cancer (HCC)    BREAST  . COPD (chronic obstructive pulmonary disease) ([place])   . Depression   . Diabetes mellitus    type 2 IDDM x 10 years  . GERD (gastroesophageal reflux disease)   . Hip pain   . Hypertension   . Hypothyroidism   . Peripheral vascular disease ([place])   . Sleep apnea    does not use cpap      FAMILY HISTORY:   Family History  Problem Relation Age of Onset  . Heart disease Mother   . Cancer Father        LUNG  . Cancer Sister        BONE  . Anesthesia problems Neg Hx     SOCIAL HISTORY:   Social History   Tobacco Use  . Smoking status: Current Every Day Smoker    Packs/day: 1.00    Years: 50.00    Pack years: 50.00    Types: Cigarettes  . Smokeless tobacco: Never Used  Substance Use Topics  . Alcohol use: Not on file     ALLERGIES:   Allergies  Allergen Reactions  . Penicillins Swelling and Rash    Has patient had a PCN reaction causing immediate rash, facial/tongue/throat swelling, SOB or lightheadedness with hypotension: No Has patient had a PCN reaction causing  severe rash involving mucus membranes or skin necrosis: No Has patient had a PCN reaction that required hospitalization: No Has patient had a PCN reaction occurring within the last 10 years:No If all of the above answers are "NO", then may proceed with Cephalosporin use.      CURRENT MEDICATIONS:   Current Outpatient Medications  Medication Sig Dispense Refill  . acetaminophen (TYLENOL) 325 MG tablet Take 650 mg by mouth every 6 (six) hours as needed (for pain.).    Marland Kitchen alendronate (FOSAMAX) 70 MG tablet Take 70 mg by mouth every Friday.  5  . ALPRAZolam (XANAX) 0.5 MG tablet Take 0.5 mg by mouth at bedtime as needed. For sleep  0  . amLODipine-benazepril (LOTREL) 5-40 MG capsule Take 1 capsule by mouth at bedtime.  5  . anastrozole (ARIMIDEX) 1 MG tablet Take 1 mg by mouth daily.    Marland Kitchen atenolol (TENORMIN) 100 MG tablet Take 100 mg by mouth daily.    Marland Kitchen atorvastatin (LIPITOR)  40 MG tablet Take 40 mg by mouth at bedtime.  5  . Calcium-Magnesium-Vitamin D (CALCIUM MAGNESIUM PO) Take 1 tablet by mouth 2 (two) times daily.    . Cholecalciferol (VITAMIN D3 PO) Take 1 tablet by mouth 2 (two) times daily.    . fenofibrate 160 MG tablet Take 160 mg by mouth daily.    . fish oil-omega-3 fatty acids 1000 MG capsule Take 1 g by mouth 2 (two) times daily.    Marland Kitchen gabapentin (NEURONTIN) 100 MG capsule Take 1 capsule (100 mg total) by mouth 2 (two) times daily. 1 capsule 10  . Insulin Glargine (LANTUS SOLOSTAR) 100 UNIT/ML Solostar Pen Inject 45 Units into the skin daily at 10 pm. (Patient taking differently: Inject 30-40 Units into the skin daily at 10 pm. ) 5 pen 11  . lansoprazole (PREVACID) 30 MG capsule Take 30 mg by mouth 2 (two) times daily.  5  . levothyroxine (SYNTHROID, LEVOTHROID) 125 MCG tablet Take 125 mcg by mouth daily before breakfast.     . losartan (COZAAR) 100 MG tablet Take 100 mg by mouth daily with breakfast.  0  . PARoxetine (PAXIL) 40 MG tablet Take 40 mg by mouth daily.     Marland Kitchen rOPINIRole (REQUIP) 4 MG tablet Take 4 mg by mouth at bedtime.     No current facility-administered medications for this visit.     REVIEW OF SYSTEMS:   [X]  denotes positive finding, [ ]  denotes negative finding Cardiac  Comments:  Chest pain or chest pressure:  Bell started no   Shortness of breath upon exertion:    Short of breath when lying flat:    Irregular heart rhythm:        Vascular    Pain in calf, thigh, or hip brought on by ambulation:    Pain in feet at night that wakes you up from your sleep:     Blood clot in your veins:    Leg swelling:         Pulmonary    Oxygen at home:    Productive cough:     Wheezing:         Neurologic    Sudden weakness in arms or legs:     Sudden numbness in arms or legs:     Sudden onset of difficulty speaking or slurred speech:    Temporary loss of vision in one eye:     Problems with dizziness:         Gastrointestinal  Blood in stool:     Vomited blood:         Genitourinary    Burning when urinating:     Blood in urine:        Psychiatric    Major depression:         Hematologic    Bleeding problems:    Problems with blood clotting too easily:        Skin    Rashes or ulcers:        Constitutional    Fever or chills:      PHYSICAL EXAM:   There were no vitals filed for this visit.  GENERAL: The patient is a well-nourished female, in no acute distress. The vital signs are documented above. CARDIAC: There is a regular rate and rhythm.  VASCULAR: Nonpalpable pedal pulses.  Palpable femoral pulses. PULMONARY: Non-labored respirations ABDOMEN: Soft and non-tender with normal pitched bowel sounds.  MUSCULOSKELETAL: There are no major deformities or cyanosis. NEUROLOGIC: No focal weakness or paresthesias are detected. SKIN: There are no ulcers or rashes noted. PSYCHIATRIC: The patient has a normal affect.  STUDIES:   None  MEDICAL ISSUES:   I discussed with the patient and her son, I do not feel that her symptoms justify a below-knee popliteal artery bypass graft to a small target artery and a small saphenous vein and the patient is continuing to smoke.  She appears to have had some benefit from the cilostazol.  Her son feels that she is getting around much better.  I have again encouraged her to quit smoking.  She will contact me should she develop worsening symptoms or nonhealing wound.  Otherwise she will follow-up in 6 months.    [name], MD [facility] [phone] Pager [phone]

## 2017-09-27 DIAGNOSIS — Z1331 Encounter for screening for depression: Secondary | ICD-10-CM | POA: Diagnosis not present

## 2017-09-27 DIAGNOSIS — E039 Hypothyroidism, unspecified: Secondary | ICD-10-CM | POA: Diagnosis not present

## 2017-09-27 DIAGNOSIS — E1140 Type 2 diabetes mellitus with diabetic neuropathy, unspecified: Secondary | ICD-10-CM | POA: Diagnosis not present

## 2017-09-27 DIAGNOSIS — E785 Hyperlipidemia, unspecified: Secondary | ICD-10-CM | POA: Diagnosis not present

## 2017-09-27 DIAGNOSIS — I10 Essential (primary) hypertension: Secondary | ICD-10-CM | POA: Diagnosis not present

## 2017-09-27 DIAGNOSIS — Z139 Encounter for screening, unspecified: Secondary | ICD-10-CM | POA: Diagnosis not present

## 2017-09-27 DIAGNOSIS — Z681 Body mass index (BMI) 19 or less, adult: Secondary | ICD-10-CM | POA: Diagnosis not present

## 2017-09-27 DIAGNOSIS — F419 Anxiety disorder, unspecified: Secondary | ICD-10-CM | POA: Diagnosis not present

## 2017-09-27 DIAGNOSIS — Z720 Tobacco use: Secondary | ICD-10-CM | POA: Diagnosis not present

## 2017-10-30 DIAGNOSIS — E785 Hyperlipidemia, unspecified: Secondary | ICD-10-CM | POA: Diagnosis not present

## 2017-10-30 DIAGNOSIS — Z9181 History of falling: Secondary | ICD-10-CM | POA: Diagnosis not present

## 2017-10-30 DIAGNOSIS — N959 Unspecified menopausal and perimenopausal disorder: Secondary | ICD-10-CM | POA: Diagnosis not present

## 2017-10-30 DIAGNOSIS — Z1331 Encounter for screening for depression: Secondary | ICD-10-CM | POA: Diagnosis not present

## 2017-10-30 DIAGNOSIS — Z1389 Encounter for screening for other disorder: Secondary | ICD-10-CM | POA: Diagnosis not present

## 2017-10-30 DIAGNOSIS — Z0000 Encounter for general adult medical examination without abnormal findings: Secondary | ICD-10-CM | POA: Diagnosis not present

## 2017-10-30 DIAGNOSIS — E1140 Type 2 diabetes mellitus with diabetic neuropathy, unspecified: Secondary | ICD-10-CM | POA: Diagnosis not present

## 2017-10-30 DIAGNOSIS — Z23 Encounter for immunization: Secondary | ICD-10-CM | POA: Diagnosis not present

## 2017-10-30 DIAGNOSIS — Z1231 Encounter for screening mammogram for malignant neoplasm of breast: Secondary | ICD-10-CM | POA: Diagnosis not present

## 2017-12-02 DIAGNOSIS — F17210 Nicotine dependence, cigarettes, uncomplicated: Secondary | ICD-10-CM | POA: Diagnosis not present

## 2017-12-02 DIAGNOSIS — S62102A Fracture of unspecified carpal bone, left wrist, initial encounter for closed fracture: Secondary | ICD-10-CM | POA: Diagnosis not present

## 2017-12-02 DIAGNOSIS — E119 Type 2 diabetes mellitus without complications: Secondary | ICD-10-CM | POA: Diagnosis not present

## 2017-12-16 DIAGNOSIS — Z6820 Body mass index (BMI) 20.0-20.9, adult: Secondary | ICD-10-CM | POA: Diagnosis not present

## 2017-12-16 DIAGNOSIS — S62109A Fracture of unspecified carpal bone, unspecified wrist, initial encounter for closed fracture: Secondary | ICD-10-CM | POA: Diagnosis not present

## 2017-12-17 DIAGNOSIS — S52502A Unspecified fracture of the lower end of left radius, initial encounter for closed fracture: Secondary | ICD-10-CM | POA: Diagnosis not present

## 2017-12-27 DIAGNOSIS — F419 Anxiety disorder, unspecified: Secondary | ICD-10-CM | POA: Diagnosis not present

## 2017-12-27 DIAGNOSIS — Z681 Body mass index (BMI) 19 or less, adult: Secondary | ICD-10-CM | POA: Diagnosis not present

## 2017-12-27 DIAGNOSIS — E785 Hyperlipidemia, unspecified: Secondary | ICD-10-CM | POA: Diagnosis not present

## 2017-12-27 DIAGNOSIS — Z720 Tobacco use: Secondary | ICD-10-CM | POA: Diagnosis not present

## 2017-12-27 DIAGNOSIS — E039 Hypothyroidism, unspecified: Secondary | ICD-10-CM | POA: Diagnosis not present

## 2017-12-27 DIAGNOSIS — E1140 Type 2 diabetes mellitus with diabetic neuropathy, unspecified: Secondary | ICD-10-CM | POA: Diagnosis not present

## 2017-12-27 DIAGNOSIS — I10 Essential (primary) hypertension: Secondary | ICD-10-CM | POA: Diagnosis not present

## 2017-12-27 DIAGNOSIS — Z87891 Personal history of nicotine dependence: Secondary | ICD-10-CM | POA: Diagnosis not present

## 2018-01-15 DIAGNOSIS — S52532D Colles' fracture of left radius, subsequent encounter for closed fracture with routine healing: Secondary | ICD-10-CM | POA: Diagnosis not present

## 2018-02-14 DIAGNOSIS — S52532D Colles' fracture of left radius, subsequent encounter for closed fracture with routine healing: Secondary | ICD-10-CM | POA: Diagnosis not present

## 2018-03-24 ENCOUNTER — Encounter: Payer: Self-pay | Admitting: Surgery

## 2018-03-24 ENCOUNTER — Ambulatory Visit: Payer: Medicare HMO | Admitting: Surgery

## 2018-03-24 VITALS — BP 246/91 | HR 74 | Temp 97.1°F | Resp 18 | Ht 62.0 in | Wt 111.7 lb

## 2018-03-24 DIAGNOSIS — I70213 Atherosclerosis of native arteries of extremities with intermittent claudication, bilateral legs: Secondary | ICD-10-CM | POA: Diagnosis not present

## 2018-03-24 MED ORDER — CILOSTAZOL 100 MG PO TABS: 100.0000 mg | ORAL_TABLET | Freq: Two times a day (BID) | ORAL | 11 refills | Status: DC

## 2018-03-24 NOTE — Progress Notes (Signed)
[facility] of [place]  Patient name: [name] MRN: [id number] DOB: 11/03/1940 Sex: female   REASON FOR VISIT:    Follow up  HISOTRY OF PRESENT ILLNESS:    Alicia Nicklas Bullinsis a 77 y.o.femalewho is a former patient of Dr. Kellie Simmering. She is status post aortobifemoral bypass graft on 12/21/2010 using a 12 x 7 graft. 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 On 02/29/2012 patient went back to the operating room for a femoral endarterectomy with patch angioplasty. She is here today stating that for the past 3 months she has been having severe leg pain that limits her walking. The right leg bothers her much more than the left. She does not have any open wounds.  She is status post angiography and September 2018 because of a significant drop in her ABIs. This study showed that the aortobifemoral bypass graft was widely patent. She had diffuse disease throughout her right superficial femoral popliteal arteries.    She states that she is having trouble with leg swelling as well as persistent pain that she describes as a burning type pain.  It does wake her up at night.  She does not have any open wounds.  She has been unable to stop smoking and has actually increased her amount.   PAST MEDICAL HISTORY:   Past Medical History:  Diagnosis Date  . Arthritis   . Blood transfusion 2011  . Cancer (HCC)    BREAST  . COPD (chronic obstructive pulmonary disease) ([place])   . Depression   . Diabetes mellitus    type 2 IDDM x 10 years  . GERD (gastroesophageal reflux disease)   . Hip pain   . Hypertension   . Hypothyroidism   . Peripheral vascular disease ([place])   . Sleep apnea    does not use cpap      FAMILY HISTORY:   Family History  Problem Relation Age of Onset  . Heart disease Mother   . Cancer Father        LUNG  . Cancer Sister        BONE  . Anesthesia problems Neg Hx      SOCIAL HISTORY:   Social History   Tobacco Use  . Smoking status: Current Every Day Smoker    Packs/day: 1.00    Years: 50.00    Pack years: 50.00    Types: Cigarettes  . Smokeless tobacco: Never Used  Substance Use Topics  . Alcohol use: Not on file     ALLERGIES:   Allergies  Allergen Reactions  . Penicillins Swelling and Rash    Has patient had a PCN reaction causing immediate rash, facial/tongue/throat swelling, SOB or lightheadedness with hypotension: No Has patient had a PCN reaction causing severe rash involving mucus membranes or skin necrosis: No Has patient had a PCN reaction that required hospitalization: No Has patient had a PCN reaction occurring within the last 10 years:No If all of the above answers are "NO", then may proceed with Cephalosporin use.      CURRENT MEDICATIONS:   Current Outpatient Medications  Medication Sig Dispense Refill  . acetaminophen (TYLENOL) 325 MG tablet Take 650 mg by mouth every 6 (six) hours as needed (for pain.).    Marland Kitchen alendronate (FOSAMAX) 70 MG tablet Take 70 mg by mouth every Friday.  5  . ALPRAZolam (XANAX) 0.5 MG tablet Take 0.5 mg by mouth at bedtime as needed. For sleep  0  . amLODipine-benazepril (  LOTREL) 5-40 MG capsule Take 1 capsule by mouth at bedtime.  5  . anastrozole (ARIMIDEX) 1 MG tablet Take 1 mg by mouth daily.    Marland Kitchen atenolol (TENORMIN) 100 MG tablet Take 100 mg by mouth daily.    Marland Kitchen atorvastatin (LIPITOR) 40 MG tablet Take 40 mg by mouth at bedtime.  5  . Calcium-Magnesium-Vitamin D (CALCIUM MAGNESIUM PO) Take 1 tablet by mouth 2 (two) times daily.    . Cholecalciferol (VITAMIN D3 PO) Take 1 tablet by mouth 2 (two) times daily.    . fenofibrate 160 MG tablet Take 160 mg by mouth daily.    . fish oil-omega-3 fatty acids 1000 MG capsule Take 1 g by mouth 2 (two) times daily.    Marland Kitchen gabapentin (NEURONTIN) 100 MG capsule Take 1 capsule (100 mg total) by mouth 2 (two) times daily. 1 capsule 10  . Insulin  Glargine (LANTUS SOLOSTAR) 100 UNIT/ML Solostar Pen Inject 45 Units into the skin daily at 10 pm. (Patient taking differently: Inject 30-40 Units into the skin daily at 10 pm. ) 5 pen 11  . lansoprazole (PREVACID) 30 MG capsule Take 30 mg by mouth 2 (two) times daily.  5  . levothyroxine (SYNTHROID, LEVOTHROID) 125 MCG tablet Take 125 mcg by mouth daily before breakfast.     . losartan (COZAAR) 100 MG tablet Take 100 mg by mouth daily with breakfast.  0  . nicotine (NICODERM CQ - DOSED IN MG/24 HOURS) 21 mg/24hr patch Place 21 mg onto the skin daily.     No current facility-administered medications for this visit.     REVIEW OF SYSTEMS:   [X]  denotes positive finding, [ ]  denotes negative finding Cardiac  Comments:  Chest pain or chest pressure:    Shortness of breath upon exertion:    Short of breath when lying flat:    Irregular heart rhythm:        Vascular    Pain in calf, thigh, or hip brought on by ambulation: x   Pain in feet at night that wakes you up from your sleep:  x   Blood clot in your veins:    Leg swelling:  x       Pulmonary    Oxygen at home:    Productive cough:     Wheezing:         Neurologic    Sudden weakness in arms or legs:     Sudden numbness in arms or legs:     Sudden onset of difficulty speaking or slurred speech:    Temporary loss of vision in one eye:     Problems with dizziness:         Gastrointestinal    Blood in stool:     Vomited blood:         Genitourinary    Burning when urinating:     Blood in urine:        Psychiatric    Major depression:         Hematologic    Bleeding problems:    Problems with blood clotting too easily:        Skin    Rashes or ulcers:        Constitutional    Fever or chills:      PHYSICAL EXAM:   Vitals:   03/24/18 1409 03/24/18 1411  BP: (!) 235/91 (!) 246/91  Pulse: 74   Resp: 18   Temp: (!) 97.1 F (36.2 C)  TempSrc: Oral   SpO2: 100%   Weight: 111 lb 11.2 oz (50.7 kg)   Height: 5'  2" (1.575 m)     GENERAL: The patient is a well-nourished female, in no acute distress. The vital signs are documented above. CARDIAC: There is a regular rate and rhythm.  VASCULAR: non palpable pedal pulses PULMONARY: Non-labored respirations  MUSCULOSKELETAL: There are no major deformities or cyanosis. NEUROLOGIC: No focal weakness or paresthesias are detected. SKIN: There are no ulcers or rashes noted. PSYCHIATRIC: The patient has a normal affect.  STUDIES:   none  MEDICAL ISSUES:   I discussed with the patient and her son that I was very reluctant to proceed with arterial interventions with her needing a below-knee intervention in the setting of ongoing smoking.  Her symptoms do appear to have progressed from non-lifestyle limiting claudication to claudication.  I think she also has some form of neuropathic pain.  I have asked her to increase her Neurontin dose by taking 200 mg at night.  She is on low-dose because she has been unsteady on her feet when taking higher doses.  I am also starting her on Pletal to see if she gets any benefit from this.  She will also be given 15-20 mm grade compression for lower extremity edema.  I will follow-up with her in 6 months to see how she is doing.    [name], MD [facility] of [facility] [phone] Pager [phone]

## 2018-03-31 ENCOUNTER — Ambulatory Visit: Payer: PPO | Admitting: Surgery

## 2018-04-01 DIAGNOSIS — E785 Hyperlipidemia, unspecified: Secondary | ICD-10-CM | POA: Diagnosis not present

## 2018-04-01 DIAGNOSIS — I10 Essential (primary) hypertension: Secondary | ICD-10-CM | POA: Diagnosis not present

## 2018-04-01 DIAGNOSIS — M8589 Other specified disorders of bone density and structure, multiple sites: Secondary | ICD-10-CM | POA: Diagnosis not present

## 2018-04-01 DIAGNOSIS — Z6820 Body mass index (BMI) 20.0-20.9, adult: Secondary | ICD-10-CM | POA: Diagnosis not present

## 2018-04-01 DIAGNOSIS — F419 Anxiety disorder, unspecified: Secondary | ICD-10-CM | POA: Diagnosis not present

## 2018-04-01 DIAGNOSIS — Z1231 Encounter for screening mammogram for malignant neoplasm of breast: Secondary | ICD-10-CM | POA: Diagnosis not present

## 2018-04-01 DIAGNOSIS — E1140 Type 2 diabetes mellitus with diabetic neuropathy, unspecified: Secondary | ICD-10-CM | POA: Diagnosis not present

## 2018-04-01 DIAGNOSIS — R159 Full incontinence of feces: Secondary | ICD-10-CM | POA: Diagnosis not present

## 2018-04-01 DIAGNOSIS — E039 Hypothyroidism, unspecified: Secondary | ICD-10-CM | POA: Diagnosis not present

## 2018-04-02 DIAGNOSIS — R1032 Left lower quadrant pain: Secondary | ICD-10-CM | POA: Diagnosis not present

## 2018-04-02 DIAGNOSIS — R159 Full incontinence of feces: Secondary | ICD-10-CM | POA: Diagnosis not present

## 2018-04-02 DIAGNOSIS — K644 Residual hemorrhoidal skin tags: Secondary | ICD-10-CM | POA: Diagnosis not present

## 2018-04-02 DIAGNOSIS — K219 Gastro-esophageal reflux disease without esophagitis: Secondary | ICD-10-CM | POA: Diagnosis not present

## 2018-04-30 DIAGNOSIS — E039 Hypothyroidism, unspecified: Secondary | ICD-10-CM | POA: Diagnosis not present

## 2018-05-13 DIAGNOSIS — R197 Diarrhea, unspecified: Secondary | ICD-10-CM | POA: Diagnosis not present

## 2018-06-18 DIAGNOSIS — I70213 Atherosclerosis of native arteries of extremities with intermittent claudication, bilateral legs: Secondary | ICD-10-CM | POA: Diagnosis not present

## 2018-06-18 DIAGNOSIS — E11621 Type 2 diabetes mellitus with foot ulcer: Secondary | ICD-10-CM | POA: Diagnosis not present

## 2018-06-18 DIAGNOSIS — L97509 Non-pressure chronic ulcer of other part of unspecified foot with unspecified severity: Secondary | ICD-10-CM | POA: Diagnosis not present

## 2018-06-18 DIAGNOSIS — I739 Peripheral vascular disease, unspecified: Secondary | ICD-10-CM | POA: Diagnosis not present

## 2018-06-20 ENCOUNTER — Other Ambulatory Visit: Payer: Self-pay | Admitting: *Deleted

## 2018-06-20 ENCOUNTER — Encounter (INDEPENDENT_AMBULATORY_CARE_PROVIDER_SITE_OTHER): Payer: Self-pay

## 2018-06-20 ENCOUNTER — Encounter: Payer: Self-pay | Admitting: *Deleted

## 2018-06-20 ENCOUNTER — Ambulatory Visit: Payer: Medicare HMO | Admitting: Physician Assistant

## 2018-06-20 ENCOUNTER — Other Ambulatory Visit: Payer: Self-pay

## 2018-06-20 VITALS — BP 140/70 | HR 59 | Temp 97.5°F | Resp 14 | Ht 62.0 in

## 2018-06-20 DIAGNOSIS — I70213 Atherosclerosis of native arteries of extremities with intermittent claudication, bilateral legs: Secondary | ICD-10-CM | POA: Diagnosis not present

## 2018-06-20 DIAGNOSIS — L97311 Non-pressure chronic ulcer of right ankle limited to breakdown of skin: Secondary | ICD-10-CM | POA: Diagnosis not present

## 2018-06-20 NOTE — Progress Notes (Signed)
HISTORY AND PHYSICAL     CC:  Foot pain Requesting Provider:  Nicoletta Dress, MD  HPI: This is a 77 y.o. female pt of Dr. Trula Slade (formerly Dr. Kellie Simmering) who is s/p aortobifemoral bypass grafting on 12/21/10 using a 12 x 7 graft and was done for severe aortoiliac occlusive dz with claudication.  She was taken back to the operating room on 02/29/12 for a left femoral endarterectomy with patch angioplasty.  She underwent angiogram in September 2018 b/c she had a significant drop in her ABI's.  At that time, her aortobifemoral graft was widely patent.  She had diffuse dz throughout her right superficial femoral and popliteal arteries.    She saw Dr. Trula Slade in June for c/o increased leg pain that limited her walking.  Right > left.  She did not have any open wounds at that time.  She was also having trouble with leg swelling as well as a burning type pain.  At that visit, she was still smoking and had increased how much she was smoking.  Dr. Trula Slade was reluctant at that time to proceed with arterial interventions with ongoing smoking.  He felt her sx had progressed.  He also felt she had a component of neuropathic pain and her neurontin was increased to 200 mg qhs (low dose due to unsteadiness with increased doses in the past).  She was also started on Pletal to see if she could get some benefit from it and 15-20 mmHg compression stockings for the edema.   She comes in today with complaints of a new painful ulcer on her right medial malleolus that has been present since after her visit with Dr. Trula Slade in June.  She states that she saw Dr. Delena Bali and he started her on Clindamycin and referred her to see [place].  She states that nothing makes it better.  She was also given pain medicine (Vicodin) by Dr. Delena Bali and this has also failed to give her relief.  She states that she did wear the compression after her last visit, however, her son states that it was only for a few days.  She continues to have the  burning pain in her foot.  She denies any fevers but states she has to wear a coat in her house b/c she is cold.  Son states that she is cold since getting older.   Her son states that she has quit smoking off and on.  When she quits she feels better.   She states she has not smoked in 2 weeks.   The pt is on a statin for cholesterol management.  She is on a CCB/ACEI/BB for blood pressure control.  She is on insulin for diabetes.   Past Medical History:  Diagnosis Date  . Arthritis   . Blood transfusion 2011  . Cancer (HCC)    BREAST  . COPD (chronic obstructive pulmonary disease) ([place])   . Depression   . Diabetes mellitus    type 2 IDDM x 10 years  . GERD (gastroesophageal reflux disease)   . Hip pain   . Hypertension   . Hypothyroidism   . Peripheral vascular disease ([place])   . Sleep apnea    does not use cpap     Past Surgical History:  Procedure Laterality Date  . ABDOMINAL ANGIOGRAM N/A 01/31/2012   Procedure: ABDOMINAL ANGIOGRAM;  Surgeon: Conrad Albemarle, MD;  Location: [facility] CATH LAB;  Service: Cardiovascular;  Laterality: N/A;  . ABDOMINAL AORTOGRAM N/A 07/16/2017  Procedure: ABDOMINAL AORTOGRAM;  Surgeon: Serafina Mitchell, MD;  Location: [place] CV LAB;  Service: Cardiovascular;  Laterality: N/A;  . APPENDECTOMY    . BREAST SURGERY  2009   lumpectomy Rt breast  . CHOLECYSTECTOMY    . FEET SURGERY    . LOWER EXTREMITY ANGIOGRAPHY Bilateral 07/16/2017   Procedure: Lower Extremity Angiography;  Surgeon: Serafina Mitchell, MD;  Location: [place] CV LAB;  Service: Cardiovascular;  Laterality: Bilateral;  . PR VEIN BYPASS GRAFT,AORTO-FEM-POP    . TUBAL LIGATION      Allergies  Allergen Reactions  . Penicillins Swelling and Rash    Has patient had a PCN reaction causing immediate rash, facial/tongue/throat swelling, SOB or lightheadedness with hypotension: No Has patient had a PCN reaction causing severe rash involving mucus membranes or skin necrosis: No Has patient  had a PCN reaction that required hospitalization: No Has patient had a PCN reaction occurring within the last 10 years:No If all of the above answers are "NO", then may proceed with Cephalosporin use.     Current Outpatient Medications  Medication Sig Dispense Refill  . acetaminophen (TYLENOL) 325 MG tablet Take 650 mg by mouth every 6 (six) hours as needed (for pain.).    Marland Kitchen alendronate (FOSAMAX) 70 MG tablet Take 70 mg by mouth every Friday.  5  . ALPRAZolam (XANAX) 0.5 MG tablet Take 0.5 mg by mouth at bedtime as needed. For sleep  0  . amLODipine-benazepril (LOTREL) 5-40 MG capsule Take 1 capsule by mouth at bedtime.  5  . anastrozole (ARIMIDEX) 1 MG tablet Take 1 mg by mouth daily.    Marland Kitchen atenolol (TENORMIN) 100 MG tablet Take 100 mg by mouth daily.    Marland Kitchen atorvastatin (LIPITOR) 40 MG tablet Take 40 mg by mouth at bedtime.  5  . Calcium-Magnesium-Vitamin D (CALCIUM MAGNESIUM PO) Take 1 tablet by mouth 2 (two) times daily.    . Cholecalciferol (VITAMIN D3 PO) Take 1 tablet by mouth 2 (two) times daily.    . cilostazol (PLETAL) 100 MG tablet Take 1 tablet (100 mg total) by mouth 2 (two) times daily before a meal. 60 tablet 11  . fenofibrate 160 MG tablet Take 160 mg by mouth daily.    . fish oil-omega-3 fatty acids 1000 MG capsule Take 1 g by mouth 2 (two) times daily.    Marland Kitchen gabapentin (NEURONTIN) 100 MG capsule Take 1 capsule (100 mg total) by mouth 2 (two) times daily. 1 capsule 10  . Insulin Glargine (LANTUS SOLOSTAR) 100 UNIT/ML Solostar Pen Inject 45 Units into the skin daily at 10 pm. (Patient taking differently: Inject 30-40 Units into the skin daily at 10 pm. ) 5 pen 11  . lansoprazole (PREVACID) 30 MG capsule Take 30 mg by mouth 2 (two) times daily.  5  . levothyroxine (SYNTHROID, LEVOTHROID) 125 MCG tablet Take 125 mcg by mouth daily before breakfast.     . losartan (COZAAR) 100 MG tablet Take 100 mg by mouth daily with breakfast.  0  . nicotine (NICODERM CQ - DOSED IN MG/24 HOURS) 21  mg/24hr patch Place 21 mg onto the skin daily.     No current facility-administered medications for this visit.     Family History  Problem Relation Age of Onset  . Heart disease Mother   . Cancer Father        LUNG  . Cancer Sister        BONE  . Anesthesia problems Neg Hx  Social History   Socioeconomic History  . Marital status: Married    Spouse name: Not on file  . Number of children: Not on file  . Years of education: Not on file  . Highest education level: Not on file  Occupational History  . Not on file  Social Needs  . Financial resource strain: Not on file  . Food insecurity:    Worry: Not on file    Inability: Not on file  . Transportation needs:    Medical: Not on file    Non-medical: Not on file  Tobacco Use  . Smoking status: Current Every Day Smoker    Packs/day: 1.00    Years: 50.00    Pack years: 50.00    Types: Cigarettes  . Smokeless tobacco: Never Used  Substance and Sexual Activity  . Alcohol use: Not on file  . Drug use: No  . Sexual activity: Not on file  Lifestyle  . Physical activity:    Days per week: Not on file    Minutes per session: Not on file  . Stress: Not on file  Relationships  . Social connections:    Talks on phone: Not on file    Gets together: Not on file    Attends religious service: Not on file    Active member of club or organization: Not on file    Attends meetings of clubs or organizations: Not on file    Relationship status: Not on file  . Intimate partner violence:    Fear of current or ex partner: Not on file    Emotionally abused: Not on file    Physically abused: Not on file    Forced sexual activity: Not on file  Other Topics Concern  . Not on file  Social History Narrative  . Not on file     REVIEW OF SYSTEMS:   [X]  denotes positive finding, [ ]  denotes negative finding Cardiac  Comments:  Chest pain or chest pressure:    Shortness of breath upon exertion:    Short of breath when lying  flat:    Irregular heart rhythm:        Vascular    Pain in calf, thigh, or hip brought on by ambulation:    Pain in feet at night that wakes you up from your sleep:  x   Blood clot in your veins:    Leg swelling:  x       Pulmonary    Oxygen at home:    Productive cough:     Wheezing:         Neurologic    Sudden weakness in arms or legs:     Sudden numbness in arms or legs:     Sudden onset of difficulty speaking or slurred speech:    Temporary loss of vision in one eye:     Problems with dizziness:         Gastrointestinal    Blood in stool:     Vomited blood:         Genitourinary    Burning when urinating:     Blood in urine:        Psychiatric    Major depression:         Hematologic    Bleeding problems:    Problems with blood clotting too easily:        Skin    Rashes or ulcers:        Constitutional  Fever or chills:      PHYSICAL EXAMINATION:  Vitals:   06/20/18 1358  BP: 140/70  Pulse: (!) 59  Resp: 14  Temp: (!) 97.5 F (36.4 C)  SpO2: 100%   Vitals:   06/20/18 1358  Height: 5\' 2"  (1.575 m)   Body mass index is 20.43 kg/m.   General:  WDWN in NAD; vital signs documented above Gait: slow with walker HENT: WNL, normocephalic Pulmonary: normal non-labored breathing , without Rales, rhonchi,  wheezing Cardiac: regular HR, without  Murmurs without carotid bruits Abdomen: soft, NT, no masses Skin: without rashes Vascular Exam/Pulses:  Right Left  Radial 2+ (normal) 2+ (normal)  Ulnar Unable to palpate  Unable to palpate   Femoral 2+ (normal) 2+ (normal)  Popliteal Unable to palpate  Unable to palpate   DP Monophasic  monophasic  PT monophasic monophasic   Extremities: rubor present RLE with medial malleolus ulcer ~1cm; +edema RLE      Musculoskeletal: no muscle wasting or atrophy  Neurologic: A&O X 3;  No focal weakness or paresthesias are detected Psychiatric:  The pt has Normal affect.   Non-Invasive Vascular  Imaging:   None today  Pt meds includes: Statin:  Yes.   Beta Blocker:  Yes.   Aspirin:  No. ACEI:  Yes.   ARB:  Yes.   CCB use:  Yes Other Antiplatelet/Anticoagulant:  No   ASSESSMENT/PLAN:: 77 y.o. female with new painful ulcer RLE   -pt with new painful RLE medial malleolus ulcer.  Most likely mixed arterial/venous issues.  She did try compression for a few days after her visit with Dr. Trula Slade.  She has monophasic DP/PT doppler signals.  Dr. Donzetta Matters examined the pt and felt the pt needs an arteriogram with possible intervention so will schedule this with Dr. Trula Slade on Tuesday, 06/24/18.  She does have hx of aortobifemoral bypass grafting and left femoral endarterectomy with dacron patch angioplasty.  -She will continue the abx given to her by Dr. Delena Bali.   -she states she hasn't smoked in two weeks-encouraged her to continue not smoking.  -she is not on anticoagulation   Leontine Locket, PA-C [facility] [phone]  Clinic MD:  Pt seen and examined with Dr. Donzetta Matters

## 2018-06-20 NOTE — H&P (View-Only) (Signed)
HISTORY AND PHYSICAL     CC:  Foot pain Requesting Provider:  Nicoletta Dress, MD  HPI: This is a 77 y.o. female pt of Dr. Trula Slade (formerly Dr. Kellie Simmering) who is s/p aortobifemoral bypass grafting on 12/21/10 using a 12 x 7 graft and was done for severe aortoiliac occlusive dz with claudication.  She was taken back to the operating room on 02/29/12 for a left femoral endarterectomy with patch angioplasty.  She underwent angiogram in September 2018 b/c she had a significant drop in her ABI's.  At that time, her aortobifemoral graft was widely patent.  She had diffuse dz throughout her right superficial femoral and popliteal arteries.    She saw Dr. Trula Slade in June for c/o increased leg pain that limited her walking.  Right > left.  She did not have any open wounds at that time.  She was also having trouble with leg swelling as well as a burning type pain.  At that visit, she was still smoking and had increased how much she was smoking.  Dr. Trula Slade was reluctant at that time to proceed with arterial interventions with ongoing smoking.  He felt her sx had progressed.  He also felt she had a component of neuropathic pain and her neurontin was increased to 200 mg qhs (low dose due to unsteadiness with increased doses in the past).  She was also started on Pletal to see if she could get some benefit from it and 15-20 mmHg compression stockings for the edema.   She comes in today with complaints of a new painful ulcer on her right medial malleolus that has been present since after her visit with Dr. Trula Slade in June.  She states that she saw Dr. Delena Bali and he started her on Clindamycin and referred her to see [place].  She states that nothing makes it better.  She was also given pain medicine (Vicodin) by Dr. Delena Bali and this has also failed to give her relief.  She states that she did wear the compression after her last visit, however, her son states that it was only for a few days.  She continues to have the  burning pain in her foot.  She denies any fevers but states she has to wear a coat in her house b/c she is cold.  Son states that she is cold since getting older.   Her son states that she has quit smoking off and on.  When she quits she feels better.   She states she has not smoked in 2 weeks.   The pt is on a statin for cholesterol management.  She is on a CCB/ACEI/BB for blood pressure control.  She is on insulin for diabetes.   Past Medical History:  Diagnosis Date  . Arthritis   . Blood transfusion 2011  . Cancer (HCC)    BREAST  . COPD (chronic obstructive pulmonary disease) ([place])   . Depression   . Diabetes mellitus    type 2 IDDM x 10 years  . GERD (gastroesophageal reflux disease)   . Hip pain   . Hypertension   . Hypothyroidism   . Peripheral vascular disease ([place])   . Sleep apnea    does not use cpap     Past Surgical History:  Procedure Laterality Date  . ABDOMINAL ANGIOGRAM N/A 01/31/2012   Procedure: ABDOMINAL ANGIOGRAM;  Surgeon: Conrad Krum, MD;  Location: [facility] CATH LAB;  Service: Cardiovascular;  Laterality: N/A;  . ABDOMINAL AORTOGRAM N/A 07/16/2017  Procedure: ABDOMINAL AORTOGRAM;  Surgeon: Serafina Mitchell, MD;  Location: [place] CV LAB;  Service: Cardiovascular;  Laterality: N/A;  . APPENDECTOMY    . BREAST SURGERY  2009   lumpectomy Rt breast  . CHOLECYSTECTOMY    . FEET SURGERY    . LOWER EXTREMITY ANGIOGRAPHY Bilateral 07/16/2017   Procedure: Lower Extremity Angiography;  Surgeon: Serafina Mitchell, MD;  Location: [place] CV LAB;  Service: Cardiovascular;  Laterality: Bilateral;  . PR VEIN BYPASS GRAFT,AORTO-FEM-POP    . TUBAL LIGATION      Allergies  Allergen Reactions  . Penicillins Swelling and Rash    Has patient had a PCN reaction causing immediate rash, facial/tongue/throat swelling, SOB or lightheadedness with hypotension: No Has patient had a PCN reaction causing severe rash involving mucus membranes or skin necrosis: No Has patient  had a PCN reaction that required hospitalization: No Has patient had a PCN reaction occurring within the last 10 years:No If all of the above answers are "NO", then may proceed with Cephalosporin use.     Current Outpatient Medications  Medication Sig Dispense Refill  . acetaminophen (TYLENOL) 325 MG tablet Take 650 mg by mouth every 6 (six) hours as needed (for pain.).    Marland Kitchen alendronate (FOSAMAX) 70 MG tablet Take 70 mg by mouth every Friday.  5  . ALPRAZolam (XANAX) 0.5 MG tablet Take 0.5 mg by mouth at bedtime as needed. For sleep  0  . amLODipine-benazepril (LOTREL) 5-40 MG capsule Take 1 capsule by mouth at bedtime.  5  . anastrozole (ARIMIDEX) 1 MG tablet Take 1 mg by mouth daily.    Marland Kitchen atenolol (TENORMIN) 100 MG tablet Take 100 mg by mouth daily.    Marland Kitchen atorvastatin (LIPITOR) 40 MG tablet Take 40 mg by mouth at bedtime.  5  . Calcium-Magnesium-Vitamin D (CALCIUM MAGNESIUM PO) Take 1 tablet by mouth 2 (two) times daily.    . Cholecalciferol (VITAMIN D3 PO) Take 1 tablet by mouth 2 (two) times daily.    . cilostazol (PLETAL) 100 MG tablet Take 1 tablet (100 mg total) by mouth 2 (two) times daily before a meal. 60 tablet 11  . fenofibrate 160 MG tablet Take 160 mg by mouth daily.    . fish oil-omega-3 fatty acids 1000 MG capsule Take 1 g by mouth 2 (two) times daily.    Marland Kitchen gabapentin (NEURONTIN) 100 MG capsule Take 1 capsule (100 mg total) by mouth 2 (two) times daily. 1 capsule 10  . Insulin Glargine (LANTUS SOLOSTAR) 100 UNIT/ML Solostar Pen Inject 45 Units into the skin daily at 10 pm. (Patient taking differently: Inject 30-40 Units into the skin daily at 10 pm. ) 5 pen 11  . lansoprazole (PREVACID) 30 MG capsule Take 30 mg by mouth 2 (two) times daily.  5  . levothyroxine (SYNTHROID, LEVOTHROID) 125 MCG tablet Take 125 mcg by mouth daily before breakfast.     . losartan (COZAAR) 100 MG tablet Take 100 mg by mouth daily with breakfast.  0  . nicotine (NICODERM CQ - DOSED IN MG/24 HOURS) 21  mg/24hr patch Place 21 mg onto the skin daily.     No current facility-administered medications for this visit.     Family History  Problem Relation Age of Onset  . Heart disease Mother   . Cancer Father        LUNG  . Cancer Sister        BONE  . Anesthesia problems Neg Hx  Social History   Socioeconomic History  . Marital status: Married    Spouse name: Not on file  . Number of children: Not on file  . Years of education: Not on file  . Highest education level: Not on file  Occupational History  . Not on file  Social Needs  . Financial resource strain: Not on file  . Food insecurity:    Worry: Not on file    Inability: Not on file  . Transportation needs:    Medical: Not on file    Non-medical: Not on file  Tobacco Use  . Smoking status: Current Every Day Smoker    Packs/day: 1.00    Years: 50.00    Pack years: 50.00    Types: Cigarettes  . Smokeless tobacco: Never Used  Substance and Sexual Activity  . Alcohol use: Not on file  . Drug use: No  . Sexual activity: Not on file  Lifestyle  . Physical activity:    Days per week: Not on file    Minutes per session: Not on file  . Stress: Not on file  Relationships  . Social connections:    Talks on phone: Not on file    Gets together: Not on file    Attends religious service: Not on file    Active member of club or organization: Not on file    Attends meetings of clubs or organizations: Not on file    Relationship status: Not on file  . Intimate partner violence:    Fear of current or ex partner: Not on file    Emotionally abused: Not on file    Physically abused: Not on file    Forced sexual activity: Not on file  Other Topics Concern  . Not on file  Social History Narrative  . Not on file     REVIEW OF SYSTEMS:   [X]  denotes positive finding, [ ]  denotes negative finding Cardiac  Comments:  Chest pain or chest pressure:    Shortness of breath upon exertion:    Short of breath when lying  flat:    Irregular heart rhythm:        Vascular    Pain in calf, thigh, or hip brought on by ambulation:    Pain in feet at night that wakes you up from your sleep:  x   Blood clot in your veins:    Leg swelling:  x       Pulmonary    Oxygen at home:    Productive cough:     Wheezing:         Neurologic    Sudden weakness in arms or legs:     Sudden numbness in arms or legs:     Sudden onset of difficulty speaking or slurred speech:    Temporary loss of vision in one eye:     Problems with dizziness:         Gastrointestinal    Blood in stool:     Vomited blood:         Genitourinary    Burning when urinating:     Blood in urine:        Psychiatric    Major depression:         Hematologic    Bleeding problems:    Problems with blood clotting too easily:        Skin    Rashes or ulcers:        Constitutional  Fever or chills:      PHYSICAL EXAMINATION:  Vitals:   06/20/18 1358  BP: 140/70  Pulse: (!) 59  Resp: 14  Temp: (!) 97.5 F (36.4 C)  SpO2: 100%   Vitals:   06/20/18 1358  Height: 5\' 2"  (1.575 m)   Body mass index is 20.43 kg/m.   General:  WDWN in NAD; vital signs documented above Gait: slow with walker HENT: WNL, normocephalic Pulmonary: normal non-labored breathing , without Rales, rhonchi,  wheezing Cardiac: regular HR, without  Murmurs without carotid bruits Abdomen: soft, NT, no masses Skin: without rashes Vascular Exam/Pulses:  Right Left  Radial 2+ (normal) 2+ (normal)  Ulnar Unable to palpate  Unable to palpate   Femoral 2+ (normal) 2+ (normal)  Popliteal Unable to palpate  Unable to palpate   DP Monophasic  monophasic  PT monophasic monophasic   Extremities: rubor present RLE with medial malleolus ulcer ~1cm; +edema RLE      Musculoskeletal: no muscle wasting or atrophy  Neurologic: A&O X 3;  No focal weakness or paresthesias are detected Psychiatric:  The pt has Normal affect.   Non-Invasive Vascular  Imaging:   None today  Pt meds includes: Statin:  Yes.   Beta Blocker:  Yes.   Aspirin:  No. ACEI:  Yes.   ARB:  Yes.   CCB use:  Yes Other Antiplatelet/Anticoagulant:  No   ASSESSMENT/PLAN:: 77 y.o. female with new painful ulcer RLE   -pt with new painful RLE medial malleolus ulcer.  Most likely mixed arterial/venous issues.  She did try compression for a few days after her visit with Dr. Trula Slade.  She has monophasic DP/PT doppler signals.  Dr. Donzetta Matters examined the pt and felt the pt needs an arteriogram with possible intervention so will schedule this with Dr. Trula Slade on Tuesday, 06/24/18.  She does have hx of aortobifemoral bypass grafting and left femoral endarterectomy with dacron patch angioplasty.  -She will continue the abx given to her by Dr. Delena Bali.   -she states she hasn't smoked in two weeks-encouraged her to continue not smoking.  -she is not on anticoagulation   Leontine Locket, PA-C [facility] [phone]  Clinic MD:  Pt seen and examined with Dr. Donzetta Matters

## 2018-06-20 NOTE — H&P (View-Only) (Signed)
HISTORY AND PHYSICAL     CC:  Foot pain Requesting Provider:  Nicoletta Dress, MD  HPI: This is a 77 y.o. female pt of Dr. Trula Slade (formerly Dr. Kellie Simmering) who is s/p aortobifemoral bypass grafting on 12/21/10 using a 12 x 7 graft and was done for severe aortoiliac occlusive dz with claudication.  She was taken back to the operating room on 02/29/12 for a left femoral endarterectomy with patch angioplasty.  She underwent angiogram in September 2018 b/c she had a significant drop in her ABI's.  At that time, her aortobifemoral graft was widely patent.  She had diffuse dz throughout her right superficial femoral and popliteal arteries.    She saw Dr. Trula Slade in June for c/o increased leg pain that limited her walking.  Right > left.  She did not have any open wounds at that time.  She was also having trouble with leg swelling as well as a burning type pain.  At that visit, she was still smoking and had increased how much she was smoking.  Dr. Trula Slade was reluctant at that time to proceed with arterial interventions with ongoing smoking.  He felt her sx had progressed.  He also felt she had a component of neuropathic pain and her neurontin was increased to 200 mg qhs (low dose due to unsteadiness with increased doses in the past).  She was also started on Pletal to see if she could get some benefit from it and 15-20 mmHg compression stockings for the edema.   She comes in today with complaints of a new painful ulcer on her right medial malleolus that has been present since after her visit with Dr. Trula Slade in June.  She states that she saw Dr. Delena Bali and he started her on Clindamycin and referred her to see [place].  She states that nothing makes it better.  She was also given pain medicine (Vicodin) by Dr. Delena Bali and this has also failed to give her relief.  She states that she did wear the compression after her last visit, however, her son states that it was only for a few days.  She continues to have the  burning pain in her foot.  She denies any fevers but states she has to wear a coat in her house b/c she is cold.  Son states that she is cold since getting older.   Her son states that she has quit smoking off and on.  When she quits she feels better.   She states she has not smoked in 2 weeks.   The pt is on a statin for cholesterol management.  She is on a CCB/ACEI/BB for blood pressure control.  She is on insulin for diabetes.   Past Medical History:  Diagnosis Date  . Arthritis   . Blood transfusion 2011  . Cancer (HCC)    BREAST  . COPD (chronic obstructive pulmonary disease) ([place])   . Depression   . Diabetes mellitus    type 2 IDDM x 10 years  . GERD (gastroesophageal reflux disease)   . Hip pain   . Hypertension   . Hypothyroidism   . Peripheral vascular disease ([place])   . Sleep apnea    does not use cpap     Past Surgical History:  Procedure Laterality Date  . ABDOMINAL ANGIOGRAM N/A 01/31/2012   Procedure: ABDOMINAL ANGIOGRAM;  Surgeon: Conrad Tumalo, MD;  Location: [facility] CATH LAB;  Service: Cardiovascular;  Laterality: N/A;  . ABDOMINAL AORTOGRAM N/A 07/16/2017  Procedure: ABDOMINAL AORTOGRAM;  Surgeon: Serafina Mitchell, MD;  Location: [facility];  Service: Cardiovascular;  Laterality: N/A;  . APPENDECTOMY    . BREAST SURGERY  2009   lumpectomy Rt breast  . CHOLECYSTECTOMY    . FEET SURGERY    . LOWER EXTREMITY ANGIOGRAPHY Bilateral 07/16/2017   Procedure: Lower Extremity Angiography;  Surgeon: Serafina Mitchell, MD;  Location: [facility];  Service: Cardiovascular;  Laterality: Bilateral;  . PR VEIN BYPASS GRAFT,AORTO-FEM-POP    . TUBAL LIGATION      Allergies  Allergen Reactions  . Penicillins Swelling and Rash    Has patient had a PCN reaction causing immediate rash, facial/tongue/throat swelling, SOB or lightheadedness with hypotension: No Has patient had a PCN reaction causing severe rash involving mucus membranes or skin necrosis: No Has patient  had a PCN reaction that required hospitalization: No Has patient had a PCN reaction occurring within the last 10 years:No If all of the above answers are "NO", then may proceed with Cephalosporin use.     Current Outpatient Medications  Medication Sig Dispense Refill  . acetaminophen (TYLENOL) 325 MG tablet Take 650 mg by mouth every 6 (six) hours as needed (for pain.).    Marland Kitchen alendronate (FOSAMAX) 70 MG tablet Take 70 mg by mouth every Friday.  5  . ALPRAZolam (XANAX) 0.5 MG tablet Take 0.5 mg by mouth at bedtime as needed. For sleep  0  . amLODipine-benazepril (LOTREL) 5-40 MG capsule Take 1 capsule by mouth at bedtime.  5  . anastrozole (ARIMIDEX) 1 MG tablet Take 1 mg by mouth daily.    Marland Kitchen atenolol (TENORMIN) 100 MG tablet Take 100 mg by mouth daily.    Marland Kitchen atorvastatin (LIPITOR) 40 MG tablet Take 40 mg by mouth at bedtime.  5  . Calcium-Magnesium-Vitamin D (CALCIUM MAGNESIUM PO) Take 1 tablet by mouth 2 (two) times daily.    . Cholecalciferol (VITAMIN D3 PO) Take 1 tablet by mouth 2 (two) times daily.    . cilostazol (PLETAL) 100 MG tablet Take 1 tablet (100 mg total) by mouth 2 (two) times daily before a meal. 60 tablet 11  . fenofibrate 160 MG tablet Take 160 mg by mouth daily.    . fish oil-omega-3 fatty acids 1000 MG capsule Take 1 g by mouth 2 (two) times daily.    Marland Kitchen gabapentin (NEURONTIN) 100 MG capsule Take 1 capsule (100 mg total) by mouth 2 (two) times daily. 1 capsule 10  . Insulin Glargine (LANTUS SOLOSTAR) 100 UNIT/ML Solostar Pen Inject 45 Units into the skin daily at 10 pm. (Patient taking differently: Inject 30-40 Units into the skin daily at 10 pm. ) 5 pen 11  . lansoprazole (PREVACID) 30 MG capsule Take 30 mg by mouth 2 (two) times daily.  5  . levothyroxine (SYNTHROID, LEVOTHROID) 125 MCG tablet Take 125 mcg by mouth daily before breakfast.     . losartan (COZAAR) 100 MG tablet Take 100 mg by mouth daily with breakfast.  0  . nicotine (NICODERM CQ - DOSED IN MG/24 HOURS) 21  mg/24hr patch Place 21 mg onto the skin daily.     No current facility-administered medications for this visit.     Family History  Problem Relation Age of Onset  . Heart disease Mother   . Cancer Father        LUNG  . Cancer Sister        BONE  . Anesthesia problems Neg Hx  Social History   Socioeconomic History  . Marital status: Married    Spouse name: Not on file  . Number of children: Not on file  . Years of education: Not on file  . Highest education level: Not on file  Occupational History  . Not on file  Social Needs  . Financial resource strain: Not on file  . Food insecurity:    Worry: Not on file    Inability: Not on file  . Transportation needs:    Medical: Not on file    Non-medical: Not on file  Tobacco Use  . Smoking status: Current Every Day Smoker    Packs/day: 1.00    Years: 50.00    Pack years: 50.00    Types: Cigarettes  . Smokeless tobacco: Never Used  Substance and Sexual Activity  . Alcohol use: Not on file  . Drug use: No  . Sexual activity: Not on file  Lifestyle  . Physical activity:    Days per week: Not on file    Minutes per session: Not on file  . Stress: Not on file  Relationships  . Social connections:    Talks on phone: Not on file    Gets together: Not on file    Attends religious service: Not on file    Active member of club or organization: Not on file    Attends meetings of clubs or organizations: Not on file    Relationship status: Not on file  . Intimate partner violence:    Fear of current or ex partner: Not on file    Emotionally abused: Not on file    Physically abused: Not on file    Forced sexual activity: Not on file  Other Topics Concern  . Not on file  Social History Narrative  . Not on file     REVIEW OF SYSTEMS:   [X]  denotes positive finding, [ ]  denotes negative finding Cardiac  Comments:  Chest pain or chest pressure:    Shortness of breath upon exertion:    Short of breath when lying  flat:    Irregular heart rhythm:        Vascular    Pain in calf, thigh, or hip brought on by ambulation:    Pain in feet at night that wakes you up from your sleep:  x   Blood clot in your veins:    Leg swelling:  x       Pulmonary    Oxygen at home:    Productive cough:     Wheezing:         Neurologic    Sudden weakness in arms or legs:     Sudden numbness in arms or legs:     Sudden onset of difficulty speaking or slurred speech:    Temporary loss of vision in one eye:     Problems with dizziness:         Gastrointestinal    Blood in stool:     Vomited blood:         Genitourinary    Burning when urinating:     Blood in urine:        Psychiatric    Major depression:         Hematologic    Bleeding problems:    Problems with blood clotting too easily:        Skin    Rashes or ulcers:        Constitutional  Fever or chills:      PHYSICAL EXAMINATION:  Vitals:   06/20/18 1358  BP: 140/70  Pulse: (!) 59  Resp: 14  Temp: (!) 97.5 F (36.4 C)  SpO2: 100%   Vitals:   06/20/18 1358  Height: 5\' 2"  (1.575 m)   Body mass index is 20.43 kg/m.   General:  WDWN in NAD; vital signs documented above Gait: slow with walker HENT: WNL, normocephalic Pulmonary: normal non-labored breathing , without Rales, rhonchi,  wheezing Cardiac: regular HR, without  Murmurs without carotid bruits Abdomen: soft, NT, no masses Skin: without rashes Vascular Exam/Pulses:  Right Left  Radial 2+ (normal) 2+ (normal)  Ulnar Unable to palpate  Unable to palpate   Femoral 2+ (normal) 2+ (normal)  Popliteal Unable to palpate  Unable to palpate   DP Monophasic  monophasic  PT monophasic monophasic   Extremities: rubor present RLE with medial malleolus ulcer ~1cm; +edema RLE      Musculoskeletal: no muscle wasting or atrophy  Neurologic: A&O X 3;  No focal weakness or paresthesias are detected Psychiatric:  The pt has Normal affect.   Non-Invasive Vascular  Imaging:   None today  Pt meds includes: Statin:  Yes.   Beta Blocker:  Yes.   Aspirin:  No. ACEI:  Yes.   ARB:  Yes.   CCB use:  Yes Other Antiplatelet/Anticoagulant:  No   ASSESSMENT/PLAN:: 77 y.o. female with new painful ulcer RLE   -pt with new painful RLE medial malleolus ulcer.  Most likely mixed arterial/venous issues.  She did try compression for a few days after her visit with Dr. Trula Slade.  She has monophasic DP/PT doppler signals.  Dr. Donzetta Matters examined the pt and felt the pt needs an arteriogram with possible intervention so will schedule this with Dr. Trula Slade on Tuesday, 06/24/18.  She does have hx of aortobifemoral bypass grafting and left femoral endarterectomy with dacron patch angioplasty.  -She will continue the abx given to her by Dr. Delena Bali.   -she states she hasn't smoked in two weeks-encouraged her to continue not smoking.  -she is not on anticoagulation   Leontine Locket, PA-C [facility] [phone]  Clinic MD:  Pt seen and examined with Dr. Donzetta Matters

## 2018-06-24 ENCOUNTER — Encounter (HOSPITAL_COMMUNITY)
Admission: RE | Disposition: A | Discharge status: Home / Self Care | Payer: Self-pay | Source: Ambulatory Visit | Attending: Surgery

## 2018-06-24 ENCOUNTER — Ambulatory Visit (HOSPITAL_COMMUNITY)
Admission: RE | Admit: 2018-06-24 | Discharge: 2018-06-24 | Disposition: A | Discharge status: Home / Self Care | Payer: Medicare HMO | Source: Ambulatory Visit | Attending: Surgery | Admitting: Surgery

## 2018-06-24 ENCOUNTER — Other Ambulatory Visit: Payer: Self-pay

## 2018-06-24 DIAGNOSIS — Z794 Long term (current) use of insulin: Secondary | ICD-10-CM | POA: Diagnosis not present

## 2018-06-24 DIAGNOSIS — E1151 Type 2 diabetes mellitus with diabetic peripheral angiopathy without gangrene: Secondary | ICD-10-CM | POA: Diagnosis not present

## 2018-06-24 DIAGNOSIS — Z9851 Tubal ligation status: Secondary | ICD-10-CM | POA: Insufficient documentation

## 2018-06-24 DIAGNOSIS — M1990 Unspecified osteoarthritis, unspecified site: Secondary | ICD-10-CM | POA: Diagnosis not present

## 2018-06-24 DIAGNOSIS — Z9049 Acquired absence of other specified parts of digestive tract: Secondary | ICD-10-CM | POA: Insufficient documentation

## 2018-06-24 DIAGNOSIS — K219 Gastro-esophageal reflux disease without esophagitis: Secondary | ICD-10-CM | POA: Diagnosis not present

## 2018-06-24 DIAGNOSIS — F329 Major depressive disorder, single episode, unspecified: Secondary | ICD-10-CM | POA: Insufficient documentation

## 2018-06-24 DIAGNOSIS — Z9889 Other specified postprocedural states: Secondary | ICD-10-CM | POA: Insufficient documentation

## 2018-06-24 DIAGNOSIS — Z880 Allergy status to penicillin: Secondary | ICD-10-CM | POA: Insufficient documentation

## 2018-06-24 DIAGNOSIS — Z955 Presence of coronary angioplasty implant and graft: Secondary | ICD-10-CM | POA: Diagnosis not present

## 2018-06-24 DIAGNOSIS — Z853 Personal history of malignant neoplasm of breast: Secondary | ICD-10-CM | POA: Diagnosis not present

## 2018-06-24 DIAGNOSIS — G4730 Sleep apnea, unspecified: Secondary | ICD-10-CM | POA: Insufficient documentation

## 2018-06-24 DIAGNOSIS — L97319 Non-pressure chronic ulcer of right ankle with unspecified severity: Secondary | ICD-10-CM | POA: Diagnosis not present

## 2018-06-24 DIAGNOSIS — E039 Hypothyroidism, unspecified: Secondary | ICD-10-CM | POA: Insufficient documentation

## 2018-06-24 DIAGNOSIS — I10 Essential (primary) hypertension: Secondary | ICD-10-CM | POA: Diagnosis not present

## 2018-06-24 DIAGNOSIS — Z8249 Family history of ischemic heart disease and other diseases of the circulatory system: Secondary | ICD-10-CM | POA: Insufficient documentation

## 2018-06-24 DIAGNOSIS — J449 Chronic obstructive pulmonary disease, unspecified: Secondary | ICD-10-CM | POA: Insufficient documentation

## 2018-06-24 DIAGNOSIS — Z801 Family history of malignant neoplasm of trachea, bronchus and lung: Secondary | ICD-10-CM | POA: Insufficient documentation

## 2018-06-24 DIAGNOSIS — I70233 Atherosclerosis of native arteries of right leg with ulceration of ankle: Secondary | ICD-10-CM | POA: Diagnosis not present

## 2018-06-24 DIAGNOSIS — Z79899 Other long term (current) drug therapy: Secondary | ICD-10-CM | POA: Diagnosis not present

## 2018-06-24 DIAGNOSIS — F17210 Nicotine dependence, cigarettes, uncomplicated: Secondary | ICD-10-CM | POA: Diagnosis not present

## 2018-06-24 DIAGNOSIS — E11621 Type 2 diabetes mellitus with foot ulcer: Secondary | ICD-10-CM | POA: Diagnosis not present

## 2018-06-24 DIAGNOSIS — Z808 Family history of malignant neoplasm of other organs or systems: Secondary | ICD-10-CM | POA: Diagnosis not present

## 2018-06-24 DIAGNOSIS — Z79890 Hormone replacement therapy (postmenopausal): Secondary | ICD-10-CM | POA: Insufficient documentation

## 2018-06-24 HISTORY — PX: ABDOMINAL AORTOGRAM W/LOWER EXTREMITY: CATH118223

## 2018-06-24 LAB — POCT I-STAT, CHEM 8
BUN: 4 mg/dL — AB (ref 8–23)
CALCIUM ION: 1.01 mmol/L — AB (ref 1.15–1.40)
CHLORIDE: 107 mmol/L (ref 98–111)
Creatinine, Ser: 0.7 mg/dL (ref 0.44–1.00)
GLUCOSE: 44 mg/dL — AB (ref 70–99)
HEMATOCRIT: 33 % — AB (ref 36.0–46.0)
Hemoglobin: 11.2 g/dL — ABNORMAL LOW (ref 12.0–15.0)
Potassium: 3.5 mmol/L (ref 3.5–5.1)
SODIUM: 143 mmol/L (ref 135–145)
TCO2: 26 mmol/L (ref 22–32)

## 2018-06-24 LAB — GLUCOSE, CAPILLARY
GLUCOSE-CAPILLARY: 102 mg/dL — AB (ref 70–99)
GLUCOSE-CAPILLARY: 156 mg/dL — AB (ref 70–99)
GLUCOSE-CAPILLARY: 53 mg/dL — AB (ref 70–99)
Glucose-Capillary: 114 mg/dL — ABNORMAL HIGH (ref 70–99)

## 2018-06-24 SURGERY — ABDOMINAL AORTOGRAM W/LOWER EXTREMITY
Anesthesia: LOCAL

## 2018-06-24 MED ORDER — HEPARIN (PORCINE) IN NACL 1000-0.9 UT/500ML-% IV SOLN
INTRAVENOUS | Status: AC
  Filled 2018-06-24: qty 1000

## 2018-06-24 MED ORDER — MIDAZOLAM HCL 2 MG/2ML IJ SOLN
INTRAMUSCULAR | Status: AC
  Filled 2018-06-24: qty 2

## 2018-06-24 MED ORDER — SODIUM CHLORIDE 0.9% FLUSH: 3.0000 mL | Freq: Two times a day (BID) | INTRAVENOUS | Status: DC

## 2018-06-24 MED ORDER — SODIUM CHLORIDE 0.9% FLUSH: 3.0000 mL | INTRAVENOUS | Status: DC | PRN

## 2018-06-24 MED ORDER — HYDRALAZINE HCL 20 MG/ML IJ SOLN: 5.0000 mg | INTRAMUSCULAR | Status: DC | PRN

## 2018-06-24 MED ORDER — FENTANYL CITRATE (PF) 100 MCG/2ML IJ SOLN
INTRAMUSCULAR | Status: AC
  Filled 2018-06-24: qty 2

## 2018-06-24 MED ORDER — LABETALOL HCL 5 MG/ML IV SOLN
10.0000 mg | INTRAVENOUS | Status: DC | PRN
  Administered 2018-06-24 (×2): 10 mg via INTRAVENOUS

## 2018-06-24 MED ORDER — DEXTROSE 50 % IV SOLN
25.0000 mL | INTRAVENOUS | Status: AC
  Administered 2018-06-24: 25 mL via INTRAVENOUS

## 2018-06-24 MED ORDER — MORPHINE SULFATE (PF) 2 MG/ML IV SOLN: 2.0000 mg | INTRAVENOUS | Status: DC | PRN

## 2018-06-24 MED ORDER — OXYCODONE HCL 5 MG PO TABS: 5.0000 mg | ORAL_TABLET | ORAL | Status: DC | PRN

## 2018-06-24 MED ORDER — LABETALOL HCL 5 MG/ML IV SOLN
INTRAVENOUS | Status: AC
  Filled 2018-06-24: qty 4

## 2018-06-24 MED ORDER — SODIUM CHLORIDE 0.9 % IV SOLN: 250.0000 mL | INTRAVENOUS | Status: DC | PRN

## 2018-06-24 MED ORDER — ACETAMINOPHEN 325 MG PO TABS: 650.0000 mg | ORAL_TABLET | ORAL | Status: DC | PRN

## 2018-06-24 MED ORDER — HEPARIN (PORCINE) IN NACL 1000-0.9 UT/500ML-% IV SOLN
INTRAVENOUS | Status: DC | PRN
  Administered 2018-06-24 (×2): 500 mL

## 2018-06-24 MED ORDER — IODIXANOL 320 MG/ML IV SOLN
INTRAVENOUS | Status: DC | PRN
  Administered 2018-06-24: 97 mL via INTRA_ARTERIAL

## 2018-06-24 MED ORDER — LIDOCAINE HCL (PF) 1 % IJ SOLN
INTRAMUSCULAR | Status: AC
  Filled 2018-06-24: qty 30

## 2018-06-24 MED ORDER — SODIUM CHLORIDE 0.9 % WEIGHT BASED INFUSION: 1.0000 mL/kg/h | INTRAVENOUS | Status: DC

## 2018-06-24 MED ORDER — FENTANYL CITRATE (PF) 100 MCG/2ML IJ SOLN
INTRAMUSCULAR | Status: DC | PRN
  Administered 2018-06-24: 50 ug via INTRAVENOUS

## 2018-06-24 MED ORDER — DEXTROSE 50 % IV SOLN
INTRAVENOUS | Status: AC
  Filled 2018-06-24: qty 50

## 2018-06-24 MED ORDER — MIDAZOLAM HCL 2 MG/2ML IJ SOLN
INTRAMUSCULAR | Status: DC | PRN
  Administered 2018-06-24: 1 mg via INTRAVENOUS

## 2018-06-24 MED ORDER — ONDANSETRON HCL 4 MG/2ML IJ SOLN: 4.0000 mg | Freq: Four times a day (QID) | INTRAMUSCULAR | Status: DC | PRN

## 2018-06-24 MED ORDER — LIDOCAINE HCL (PF) 1 % IJ SOLN
INTRAMUSCULAR | Status: DC | PRN
  Administered 2018-06-24: 15 mL

## 2018-06-24 MED ORDER — SODIUM CHLORIDE 0.9 % IV SOLN
INTRAVENOUS | Status: DC
  Administered 2018-06-24: 07:00:00 via INTRAVENOUS

## 2018-06-24 SURGICAL SUPPLY — 9 items
CATH OMNI FLUSH 5F 65CM (CATHETERS) ×1 IMPLANT
KIT MICROPUNCTURE NIT STIFF (SHEATH) ×1 IMPLANT
KIT PV (KITS) ×2 IMPLANT
SHEATH PINNACLE 5F 10CM (SHEATH) ×1 IMPLANT
SHEATH PROBE COVER 6X72 (BAG) ×1 IMPLANT
SYR MEDRAD MARK V 150ML (SYRINGE) ×2 IMPLANT
TRANSDUCER W/STOPCOCK (MISCELLANEOUS) ×2 IMPLANT
TRAY PV CATH (CUSTOM PROCEDURE TRAY) ×2 IMPLANT
WIRE BENTSON .035X145CM (WIRE) ×1 IMPLANT

## 2018-06-24 NOTE — Progress Notes (Signed)
Scab noted to right inner ankle. No drainage noted. Scabs noted to right upper arm without drainage

## 2018-06-24 NOTE — Discharge Instructions (Signed)
Drink plenty of fluids over next 48 hours ° °Femoral Site Care °Refer to this sheet in the next few weeks. These instructions provide you with information about caring for yourself after your procedure. Your health care provider may also give you more specific instructions. Your treatment has been planned according to current medical practices, but problems sometimes occur. Call your health care provider if you have any problems or questions after your procedure. °What can I expect after the procedure? °After your procedure, it is typical to have the following: °· Bruising at the site that usually fades within 1-2 weeks. °· Blood collecting in the tissue (hematoma) that may be painful to the touch. It should usually decrease in size and tenderness within 1-2 weeks. ° °Follow these instructions at home: °· Take medicines only as directed by your health care provider. °· You may shower 24-48 hours after the procedure or as directed by your health care provider. Remove the bandage (dressing) and gently wash the site with plain soap and water. Pat the area dry with a clean towel. Do not rub the site, because this may cause bleeding. °· Do not take baths, swim, or use a hot tub until your health care provider approves. °· Check your insertion site every day for redness, swelling, or drainage. °· Do not apply powder or lotion to the site. °· Limit use of stairs to twice a day for the first 2-3 days or as directed by your health care provider. °· Do not squat for the first 2-3 days or as directed by your health care provider. °· Do not lift over 10 lb (4.5 kg) for 5 days after your procedure or as directed by your health care provider. °· Ask your health care provider when it is okay to: °? Return to work or school. °? Resume usual physical activities or sports. °? Resume sexual activity. °· Do not drive home if you are discharged the same day as the procedure. Have someone else drive you. °· You may drive 24 hours after  the procedure unless otherwise instructed by your health care provider. °· Do not operate machinery or power tools for 24 hours after the procedure or as directed by your health care provider. °· If your procedure was done as an outpatient procedure, which means that you went home the same day as your procedure, a responsible adult should be with you for the first 24 hours after you arrive home. °· Keep all follow-up visits as directed by your health care provider. This is important. °Contact a health care provider if: °· You have a fever. °· You have chills. °· You have increased bleeding from the site. Hold pressure on the site. °Get help right away if: °· You have unusual pain at the site. °· You have redness, warmth, or swelling at the site. °· You have drainage (other than a small amount of blood on the dressing) from the site. °· The site is bleeding, and the bleeding does not stop after 30 minutes of holding steady pressure on the site. °· Your leg or foot becomes pale, cool, tingly, or numb. °This information is not intended to replace advice given to you by your health care provider. Make sure you discuss any questions you have with your health care provider. °Document Released: 06/11/2014 Document Revised: 03/15/2016 Document Reviewed: 04/27/2014 °Elsevier Interactive Patient Education © 2018 [employer] ° °

## 2018-06-24 NOTE — Op Note (Signed)
    Patient name: [name] MRN: [id number] DOB: Oct 27, 1940 Sex: female  06/24/2018 Pre-operative Diagnosis: right leg ulcer Post-operative diagnosis:  Same Surgeon:  [name] Procedure Performed:  1.  Ultrasound-guided access, left femoral artery  2.  Abdominal aortogram  3.  Bilateral lower extremity runoff  4.  Conscious sedation (23 minutes) \   Indications: The patient has a history of an aortobifemoral bypass graft.  She has developed a ulcer on her right ankle and is here today for evaluation  Procedure:  The patient was identified in the holding area and taken to room 8.  The patient was then placed supine on the table and prepped and draped in the usual sterile fashion.  Conscious sedation was administered with the use of IV fentanyl and Versed continuous physician and nurse monitoring.  Heart rate, blood pressure, and oxygen saturation were continuously monitored.  A time out was called.  Ultrasound was used to evaluate the left common femoral artery.  It was patent .  A digital ultrasound image was acquired.  A micropuncture needle was used to access the left common femoral artery under ultrasound guidance.  An 018 wire was advanced without resistance and a micropuncture sheath was placed.  The 018 wire was removed and a benson wire was placed.  The micropuncture sheath was exchanged for a 5 french sheath.  An omniflush catheter was advanced over the wire to the level of L-1.  An abdominal angiogram was obtained.  Next, bilateral runoff was performed  Findings:   Aortogram: Irregular appearance of the right renal artery with ectasia versus dissection noted.  No focal stenosis was identified.  Left renal artery is patent.  The aorto by femoral bypass graft is widely patent.  Right Lower Extremity: Right common femoral profundofemoral artery are patent throughout the course.  The superficial femoral artery is small in caliber and occluded Dr. canal with reconstitution of the  popliteal artery however this remains until the below-knee segment three-vessel runoff is identified.  Left Lower Extremity: Left common femoral profundofemoral artery pain throughout the course.  The superficial femoral artery is small in caliber but patent.  There is disease located in the adductor canal, less than 50% stenosis.  There is three-vessel runoff  Intervention: None  Impression:  #1  Right superficial femoral artery occlusion.  Because of the size of the patient's vessels, I have elected to proceed with a right femoral to below-knee popliteal artery bypass graft.  I looked at her saphenous vein and this did appear to be adequate for a conduit.    Theotis Burrow, M.D. [facility] Office: [phone] Pager:  [phone]

## 2018-06-24 NOTE — Interval H&P Note (Signed)
History and Physical Interval Note:  06/24/2018 7:35 AM  [name]  has presented today for surgery, with the diagnosis of pvd  The various methods of treatment have been discussed with the patient and family. After consideration of risks, benefits and other options for treatment, the patient has consented to  Procedure(s): ABDOMINAL AORTOGRAM W/LOWER EXTREMITY (N/A) as a surgical intervention .  The patient's history has been reviewed, patient examined, no change in status, stable for surgery.  I have reviewed the patient's chart and labs.  Questions were answered to the patient's satisfaction.     [name]

## 2018-06-24 NOTE — Progress Notes (Signed)
Hypoglycemic Event  CBG: 44  Treatment: D50 IV 25 mL  Symptoms: Pale  Follow-up CBG: Time CBG Result: Possible Reasons for Event: Inadequate meal intake and Other: npo  Comments/MD notified:Jennifer Gus Height, Rn informed     [name], Lisabeth Pick Ward

## 2018-06-24 NOTE — Progress Notes (Signed)
Site area: LFA Site Prior to Removal:  Level 0 Pressure Applied For:20 min Manual:   yes Patient Status During Pull:  stable Post Pull Site:  Level Post Pull Instructions Given:  yes Post Pull Pulses Present: doppler Dressing Applied:  clear Bedrest begins @ 0044 [id number] Comments:

## 2018-06-24 NOTE — Progress Notes (Addendum)
Dr Trula Slade informed of CBG states we can re  check it over there. Cath lab called and informed of the need to recheck CBG.Spoke with Aaron Edelman.

## 2018-06-25 ENCOUNTER — Other Ambulatory Visit: Payer: Self-pay | Admitting: *Deleted

## 2018-06-25 ENCOUNTER — Encounter (HOSPITAL_COMMUNITY): Payer: Self-pay | Admitting: Surgery

## 2018-06-25 LAB — POCT I-STAT, CHEM 8
BUN: 7 mg/dL — ABNORMAL LOW (ref 8–23)
CALCIUM ION: 0.99 mmol/L — AB (ref 1.15–1.40)
CHLORIDE: 102 mmol/L (ref 98–111)
Creatinine, Ser: 0.8 mg/dL (ref 0.44–1.00)
GLUCOSE: 47 mg/dL — AB (ref 70–99)
HEMATOCRIT: 39 % (ref 36.0–46.0)
Hemoglobin: 13.3 g/dL (ref 12.0–15.0)
Potassium: 6.4 mmol/L (ref 3.5–5.1)
SODIUM: 137 mmol/L (ref 135–145)
TCO2: 31 mmol/L (ref 22–32)

## 2018-06-25 NOTE — Progress Notes (Signed)
Call to patient and to her son [name]. Instructed to be at [facility] admitting at 6:30 am on 07/09/18 for surgery. NPO past MN night prior. Expect a call and follow the detailed instructions received from the hospital pre-admission department about this surgery. Could not confirm if patient is actually taking PLETAL/ cilostazol, but instructed to hold x 1 week pre-op.

## 2018-06-25 NOTE — Progress Notes (Addendum)
Cardiology Office Note:    Date:  06/26/2018   ID:  [name], DOB January 06, 1941, MRN [id number]  PCP:  Nicoletta Dress, MD  Cardiologist:  Shirlee More, MD    Referring MD: Nicoletta Dress, MD   Echo shows normal LV function, proceed with planned surgery.   ASSESSMENT:    1. Preoperative cardiovascular examination   2. PVD (peripheral vascular disease) with claudication ([place])   3. Type 2 diabetes mellitus with complication, with long-term current use of insulin ([place])   4. Essential hypertension   5. Hyperlipidemia, unspecified hyperlipidemia type   6. Chronic bronchitis, unspecified chronic bronchitis type ([place])   7. Abnormal electrocardiogram (ECG) (EKG)    PLAN:    In order of problems listed above:  Preoperative cardiovascular evaluation summary  Surgeon: Dr Trula Slade Procedure: Right lower extremity revascularization The surgery is urgent Active cardiac problems abnormal EKG multiple cardiovascular risk factors but no known CAD and having no anginal discomfort.  The cardiac status is stable providing her echocardiogram to be performed in the next 1 to 2 days shows normal left ventricular segmental and global function.  If abnormal will need to do a quick ischemia evaluation however I think it be detrimental to the patient to delay her planned surgical procedure to do cardiac interventions letter elective.. The planned procedure is high  risk. The cardiac risk factors are peripheral arterial disease type 2 diabetes hypertension hyperlipidemia The functional capacity is 4 mets or greater unable to be determined, prior to the onset of the ischemic ulcer her exercise tolerance is greater than 4 METS Recent cardiac tests performed EKG in my office today Given the above his overall risk for the planned procedure is low acceptable provided echocardiogram to be done in the next 24 to 48 hours shows normal left ventricular systolic global and regional function Antiplatelet/  anticoagulant recommendation: No specific recommendation Other cardiac medication or device recommendation: Monitor on telemetry while in hospital please contact my group see [facility] for any questions Anesthesia recommendation: None Observation, monitoring,and postoperative test recommendation: Continue usual medications monitor on telemetry The patient is optimized from a cardiology perspective: Deferred awaiting results of echocardiogram I will place an addendum to this report when reviewed  1. See above showed an echocardiogram next 24 to 48 hours if normal proceed with her planned surgical revascularization right lower extremity 2. Records reviewed she requires revascularization for wound healing and avoidance of amputation. 3. Stable continue current treatment 4. Stable continue current antihypertensives may need to be transiently held immediately postoperatively to avoid hypotension 5. Stable continue her statin 6. Stable may require bronchodilators postoperatively 7. She has a crossover pattern seen more commonly in normal individuals but may represent possible preceding septal MI echocardiogram be performed   Next appointment: 3 months   Medication Adjustments/Labs and Tests Ordered: Current medicines are reviewed at length with the patient today.  Concerns regarding medicines are outlined above.  No orders of the defined types were placed in this encounter.  No orders of the defined types were placed in this encounter.   No chief complaint on file.   History of Present Illness:    [name] is a 77 y.o. 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  She is referred for preoperative cardiology evaluation with multiple risk factors  including type 2 diabetes mellitus  insulin-dependent hyperlipidemia and hypertension. Compliance with diet, lifestyle and medications: Yes she is finally stop smoking  She has a long history of peripheral vascular disease and multiple interventions.  Her world changed in June she developed an ulceration in the right lower extremity and since then she has difficulty even with ADLs and is having claudication at rest.  She finds her self short of breath that she attributes to the pain in her leg until June her exercise tolerance was greater than 4 METS.  Presently no longer can be judged.  She has a history of COPD wheezing and uses bronchodilators not recently.  She has had swelling in the right leg but no other edema no orthopnea chest pain palpitation or syncope.  She has no known history of heart disease but is never had cardiac diagnostic tests performed in the past.  Her last EKG that I can find is 2013 which was normal.  Her EKG today shows incomplete right bundle branch block and possible anterior septal MI. After nice discussion I asked the patient and her daughter to return the office 3 months after successful revascularization and will consider the merits of an ischemic evaluation on elective basis. Past Medical History:  Diagnosis Date  . AAA (abdominal aortic aneurysm) ([place]) 02/26/2012  . Arthritis   . 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 Blood transfusion 2011  . Cancer (HCC)    BREAST  . Claudication of both lower extremities ([place]) 05/28/2017  . COPD (chronic obstructive pulmonary disease) ([place])   . Depression   . Diabetes mellitus    type 2 IDDM x 10 years  . Diabetes mellitus with peripheral vascular disease ([place]) 05/28/2017  . GERD (gastroesophageal reflux disease)   . Hip pain   . Hypertension   . Hypothyroidism   . Iliac artery occlusion ([place]) 02/26/2012  . Peripheral vascular disease ([place])   . PVD (peripheral vascular disease) with claudication  ([place]) 02/26/2012  . Sleep apnea    does not use cpap   . Type II or unspecified type diabetes mellitus with peripheral circulatory disorders, uncontrolled(250.72) 06/15/2014    Past Surgical History:  Procedure Laterality Date  . ABDOMINAL ANGIOGRAM N/A 01/31/2012   Procedure: ABDOMINAL ANGIOGRAM;  Surgeon: Conrad Kinston, MD;  Location: [facility] CATH LAB;  Service: Cardiovascular;  Laterality: N/A;  . ABDOMINAL AORTOGRAM N/A 07/16/2017   Procedure: ABDOMINAL AORTOGRAM;  Surgeon: Serafina Mitchell, MD;  Location: [facility] CV LAB;  Service: Cardiovascular;  Laterality: N/A;  . ABDOMINAL AORTOGRAM W/LOWER EXTREMITY N/A 06/24/2018   Procedure: ABDOMINAL AORTOGRAM W/LOWER EXTREMITY;  Surgeon: Serafina Mitchell, MD;  Location: [facility] CV LAB;  Service: Cardiovascular;  Laterality: N/A;  . APPENDECTOMY    . BREAST SURGERY  2009   lumpectomy Rt breast  . CHOLECYSTECTOMY    . FEET SURGERY    . LOWER EXTREMITY ANGIOGRAPHY Bilateral 07/16/2017   Procedure: Lower Extremity Angiography;  Surgeon: Serafina Mitchell, MD;  Location: [facility] CV LAB;  Service: Cardiovascular;  Laterality: Bilateral;  . PR VEIN BYPASS GRAFT,AORTO-FEM-POP    . TUBAL LIGATION      Current Medications: Current Meds  Medication Sig  . alendronate (FOSAMAX) 70 MG tablet Take 70 mg by mouth every Friday.  Marland Kitchen amLODipine-benazepril (LOTREL) 5-40 MG capsule Take 1 capsule by mouth at bedtime.  Marland Kitchen anastrozole (ARIMIDEX) 1 MG tablet Take 1 mg by mouth daily.  Marland Kitchen atenolol (  TENORMIN) 100 MG tablet Take 100 mg by mouth daily.  Marland Kitchen atorvastatin (LIPITOR) 40 MG tablet Take 40 mg by mouth at bedtime.  . cilostazol (PLETAL) 100 MG tablet Take 1 tablet (100 mg total) by mouth 2 (two) times daily before a meal.  . fenofibrate 160 MG tablet Take 160 mg by mouth daily.  Marland Kitchen gabapentin (NEURONTIN) 100 MG capsule Take 1 capsule (100 mg total) by mouth 2 (two) times daily.  . Insulin Glargine (LANTUS SOLOSTAR) 100 UNIT/ML Solostar Pen Inject 45 Units  into the skin daily at 10 pm. (Patient taking differently: Inject 30 Units into the skin daily at 10 pm. )  . lansoprazole (PREVACID) 30 MG capsule Take 30 mg by mouth 2 (two) times daily.  Marland Kitchen levothyroxine (SYNTHROID, LEVOTHROID) 112 MCG tablet Take 112 mcg by mouth daily before breakfast.   . losartan (COZAAR) 100 MG tablet Take 100 mg by mouth daily with breakfast.     Allergies:   Penicillins   Social History   Socioeconomic History  . Marital status: Married    Spouse name: Not on file  . Number of children: Not on file  . Years of education: Not on file  . Highest education level: Not on file  Occupational History  . Not on file  Social Needs  . Financial resource strain: Not on file  . Food insecurity:    Worry: Not on file    Inability: Not on file  . Transportation needs:    Medical: Not on file    Non-medical: Not on file  Tobacco Use  . Smoking status: Former Smoker    Packs/day: 1.00    Years: 50.00    Pack years: 50.00    Types: Cigarettes    Last attempt to quit: 04/2018    Years since quitting: 0.1  . Smokeless tobacco: Never Used  Substance and Sexual Activity  . Alcohol use: Not Currently  . Drug use: No  . Sexual activity: Not on file  Lifestyle  . Physical activity:    Days per week: Not on file    Minutes per session: Not on file  . Stress: Not on file  Relationships  . Social connections:    Talks on phone: Not on file    Gets together: Not on file    Attends religious service: Not on file    Active member of club or organization: Not on file    Attends meetings of clubs or organizations: Not on file    Relationship status: Not on file  Other Topics Concern  . Not on file  Social History Narrative  . Not on file     Family History: The patient's family history includes Cancer in her father and sister; Heart disease in her mother. There is no history of Anesthesia problems. ROS:  Review of Systems  Constitution: Positive for decreased  appetite.  HENT: Negative.   Eyes: Negative.   Cardiovascular: Positive for dyspnea on exertion and leg swelling (RLE swelling).  Respiratory: Positive for shortness of breath and wheezing.   Endocrine: Negative.   Hematologic/Lymphatic: Negative.   Skin: Positive for color change.  Musculoskeletal: Negative.   Gastrointestinal: Negative.   Genitourinary: Negative.   Neurological: Positive for dizziness.  Psychiatric/Behavioral: Negative.   Allergic/Immunologic: Negative.    Please see the history of present illness.    All other systems reviewed and are negative.  EKGs/Labs/Other Studies Reviewed:    The following studies were reviewed today:  EKG:  EKG  ordered today.  The ekg ordered today demonstrates [place] incomplete RBBB possible old septal MI  Recent Labs: 06/24/2018: BUN 4; Creatinine, Ser 0.70; Hemoglobin 11.2; Potassium 3.5; Sodium 143  Recent Lipid Panel No results found for: CHOL, TRIG, HDL, CHOLHDL, VLDL, LDLCALC, LDLDIRECT  Physical Exam:    VS:  BP (!) 158/82 (BP Location: Left Arm, Patient Position: Sitting, Cuff Size: Small)   Pulse 60   Ht 5\' 2"  (1.575 m)   Wt 100 lb 3.2 oz (45.5 kg)   SpO2 97%   BMI 18.33 kg/m     Wt Readings from Last 3 Encounters:  06/26/18 100 lb 3.2 oz (45.5 kg)  06/24/18 110 lb 3.7 oz (50 kg)  03/24/18 111 lb 11.2 oz (50.7 kg)     GEN: Looks very frail chronically ill and debilitated in no acute distress HEENT: Normal NECK: No JVD; No carotid bruits LYMPHATICS: No lymphadenopathy CARDIAC: RRR, no murmurs, rubs, gallops RESPIRATORY:  Clear to auscultation without rales, wheezing or rhonchi  ABDOMEN: Soft, non-tender, non-distended MUSCULOSKELETAL:  No edema; No deformity her RLE is ischemic, with warmth, rubor and edema  SKIN: Warm and dry NEUROLOGIC:  Alert and oriented x 3 PSYCHIATRIC:  Normal affect    Signed, Shirlee More, MD  06/26/2018 11:44 AM    [place]

## 2018-06-26 ENCOUNTER — Ambulatory Visit (INDEPENDENT_AMBULATORY_CARE_PROVIDER_SITE_OTHER): Payer: Medicare HMO | Admitting: Cardiology

## 2018-06-26 VITALS — BP 158/82 | HR 60 | Ht 62.0 in | Wt 100.2 lb

## 2018-06-26 DIAGNOSIS — E785 Hyperlipidemia, unspecified: Secondary | ICD-10-CM | POA: Diagnosis not present

## 2018-06-26 DIAGNOSIS — Z794 Long term (current) use of insulin: Secondary | ICD-10-CM | POA: Diagnosis not present

## 2018-06-26 DIAGNOSIS — I739 Peripheral vascular disease, unspecified: Secondary | ICD-10-CM | POA: Diagnosis not present

## 2018-06-26 DIAGNOSIS — R9431 Abnormal electrocardiogram [ECG] [EKG]: Secondary | ICD-10-CM

## 2018-06-26 DIAGNOSIS — I10 Essential (primary) hypertension: Secondary | ICD-10-CM | POA: Diagnosis not present

## 2018-06-26 DIAGNOSIS — E118 Type 2 diabetes mellitus with unspecified complications: Secondary | ICD-10-CM

## 2018-06-26 DIAGNOSIS — Z01810 Encounter for preprocedural cardiovascular examination: Secondary | ICD-10-CM | POA: Diagnosis not present

## 2018-06-26 DIAGNOSIS — J42 Unspecified chronic bronchitis: Secondary | ICD-10-CM | POA: Diagnosis not present

## 2018-06-26 NOTE — Patient Instructions (Signed)
Medication Instructions:  Your physician recommends that you continue on your current medications as directed. Please refer to the Current Medication list given to you today.   Labwork: None  Testing/Procedures: You had an EKG today.   Follow-Up: Your physician wants you to follow-up in: 3 months. You will receive a reminder letter in the mail two months in advance. If you don't receive a letter, please call our office to schedule the follow-up appointment.   If you need a refill on your cardiac medications before your next appointment, please call your pharmacy.   Thank you for choosing [facility]! Leonila Speranza, RN [phone]    

## 2018-06-30 ENCOUNTER — Ambulatory Visit (INDEPENDENT_AMBULATORY_CARE_PROVIDER_SITE_OTHER): Payer: Medicare HMO

## 2018-06-30 ENCOUNTER — Other Ambulatory Visit: Payer: Self-pay

## 2018-06-30 ENCOUNTER — Telehealth: Payer: Self-pay | Admitting: *Deleted

## 2018-06-30 ENCOUNTER — Other Ambulatory Visit: Payer: Self-pay | Admitting: *Deleted

## 2018-06-30 DIAGNOSIS — I739 Peripheral vascular disease, unspecified: Secondary | ICD-10-CM

## 2018-06-30 DIAGNOSIS — R9431 Abnormal electrocardiogram [ECG] [EKG]: Secondary | ICD-10-CM | POA: Diagnosis not present

## 2018-06-30 DIAGNOSIS — Z01810 Encounter for preprocedural cardiovascular examination: Secondary | ICD-10-CM

## 2018-06-30 MED ORDER — OXYCODONE-ACETAMINOPHEN 5-325 MG PO TABS: 1.0000 | ORAL_TABLET | Freq: Four times a day (QID) | ORAL | 0 refills | Status: DC | PRN

## 2018-06-30 MED ORDER — GABAPENTIN 100 MG PO CAPS: 100.0000 mg | ORAL_CAPSULE | Freq: Two times a day (BID) | ORAL | 2 refills | Status: DC

## 2018-06-30 NOTE — Telephone Encounter (Signed)
Message left on voice mail returning patient's call.

## 2018-06-30 NOTE — Progress Notes (Signed)
Echocardiogram complete.  Keyona Emrich RDCS  

## 2018-07-03 NOTE — Pre-Procedure Instructions (Signed)
Britini Garcilazo Mace  07/03/2018      [facility], Emmit [address], [place] [address] [place] [address] Phone: [phone] Fax: [phone]  [place], [place], [place] [address] Phone: [phone] Fax: [phone]    Your procedure is scheduled on Wednesday September 18.  Report to [facility] Admitting at 6:30 A.M.  Call this number if you have problems the morning of surgery:  [phone]   Remember:  Do not eat or drink after midnight.    Take these medicines the morning of surgery with A SIP OF WATER:   Atenolol (tenormin) Gabapentin (neurontin) Anastrozole (arimidex) Lansoprazole (prevacid) Levothyroxine (synthroid) Oxycodone (percocet) if needed  Take half dose of Lantus insulin the night before surgery (15 units)  7 days prior to surgery STOP taking any Aleve, Naproxen, Ibuprofen, Motrin, Advil, Goody's, BC's, all herbal medications, fish oil, and all vitamins  FOLLOW your surgeon's instructions on whether to stop taking Cilostazol (pletal)     How to Manage Your Diabetes Before and After Surgery  Why is it important to control my blood sugar before and after surgery? . Improving blood sugar levels before and after surgery helps healing and can limit problems. . A way of improving blood sugar control is eating a healthy diet by: o  Eating less sugar and carbohydrates o  Increasing activity/exercise o  Talking with your doctor about reaching your blood sugar goals . High blood sugars (greater than 180 mg/dL) can raise your risk of infections and slow your recovery, so you will need to focus on controlling your diabetes during the weeks before surgery. . Make sure that the doctor who takes care of your diabetes knows about your planned surgery including the date and location.  How do I manage my blood sugar before surgery? . Check your blood sugar at least 4 times a day,  starting 2 days before surgery, to make sure that the level is not too high or low. o Check your blood sugar the morning of your surgery when you wake up and every 2 hours until you get to the Short Stay unit. . If your blood sugar is less than 70 mg/dL, you will need to treat for low blood sugar: o Do not take insulin. o Treat a low blood sugar (less than 70 mg/dL) with  cup of clear juice (cranberry or apple), 4 glucose tablets, OR glucose gel. Recheck blood sugar in 15 minutes after treatment (to make sure it is greater than 70 mg/dL). If your blood sugar is not greater than 70 mg/dL on recheck, call [phone] o  for further instructions. . Report your blood sugar to the short stay nurse when you get to Short Stay.  . If you are admitted to the hospital after surgery: o Your blood sugar will be checked by the staff and you will probably be given insulin after surgery (instead of oral diabetes medicines) to make sure you have good blood sugar levels. o The goal for blood sugar control after surgery is 80-180 mg/dL.               Do not wear jewelry, make-up or nail polish.  Do not wear lotions, powders, or perfumes, or deodorant.  Do not shave 48 hours prior to surgery.  Men may shave face and neck.  Do not bring valuables to the hospital.  [facility] is not  responsible for any belongings or valuables.  Contacts, dentures or bridgework may not be worn into surgery.  Leave your suitcase in the car.  After surgery it may be brought to your room.  For patients admitted to the hospital, discharge time will be determined by your treatment team.  Patients discharged the day of surgery will not be allowed to drive home.   Special instructions:    [place]- Preparing For Surgery  Before surgery, you can play an important role. Because skin is not sterile, your skin needs to be as free of germs as possible. You can reduce the number of germs on your skin by washing with CHG  (chlorahexidine gluconate) Soap before surgery.  CHG is an antiseptic cleaner which kills germs and bonds with the skin to continue killing germs even after washing.    Oral Hygiene is also important to reduce your risk of infection.  Remember - BRUSH YOUR TEETH THE MORNING OF SURGERY WITH YOUR REGULAR TOOTHPASTE  Please do not use if you have an allergy to CHG or antibacterial soaps. If your skin becomes reddened/irritated stop using the CHG.  Do not shave (including legs and underarms) for at least 48 hours prior to first CHG shower. It is OK to shave your face.  Please follow these instructions carefully.   1. Shower the NIGHT BEFORE SURGERY and the MORNING OF SURGERY with CHG.   2. If you chose to wash your hair, wash your hair first as usual with your normal shampoo.  3. After you shampoo, rinse your hair and body thoroughly to remove the shampoo.  4. Use CHG as you would any other liquid soap. You can apply CHG directly to the skin and wash gently with a scrungie or a clean washcloth.   5. Apply the CHG Soap to your body ONLY FROM THE NECK DOWN.  Do not use on open wounds or open sores. Avoid contact with your eyes, ears, mouth and genitals (private parts). Wash Face and genitals (private parts)  with your normal soap.  6. Wash thoroughly, paying special attention to the area where your surgery will be performed.  7. Thoroughly rinse your body with warm water from the neck down.  8. DO NOT shower/wash with your normal soap after using and rinsing off the CHG Soap.  9. Pat yourself dry with a CLEAN TOWEL.  10. Wear CLEAN PAJAMAS to bed the night before surgery, wear comfortable clothes the morning of surgery  11. Place CLEAN SHEETS on your bed the night of your first shower and DO NOT SLEEP WITH PETS.    Day of Surgery:  Do not apply any deodorants/lotions.  Please wear clean clothes to the hospital/surgery center.   Remember to brush your teeth WITH YOUR REGULAR  TOOTHPASTE.    Please read over the following fact sheets that you were given. Coughing and Deep Breathing, MRSA Information and Surgical Site Infection Prevention

## 2018-07-04 ENCOUNTER — Encounter (HOSPITAL_COMMUNITY)
Admission: RE | Admit: 2018-07-04 | Discharge: 2018-07-04 | Disposition: A | Discharge status: Home / Self Care | Payer: Medicare HMO | Source: Ambulatory Visit | Attending: Surgery | Admitting: Surgery

## 2018-07-04 ENCOUNTER — Encounter (HOSPITAL_COMMUNITY): Payer: Self-pay

## 2018-07-04 ENCOUNTER — Other Ambulatory Visit: Payer: Self-pay

## 2018-07-04 DIAGNOSIS — Z01812 Encounter for preprocedural laboratory examination: Secondary | ICD-10-CM | POA: Insufficient documentation

## 2018-07-04 HISTORY — DX: Acute myocardial infarction, unspecified: I21.9

## 2018-07-04 LAB — URINALYSIS, ROUTINE W REFLEX MICROSCOPIC
Bilirubin Urine: NEGATIVE
KETONES UR: NEGATIVE mg/dL
Leukocytes, UA: NEGATIVE
Nitrite: NEGATIVE
Protein, ur: 100 mg/dL — AB
Specific Gravity, Urine: 1.01 (ref 1.005–1.030)
pH: 6 (ref 5.0–8.0)

## 2018-07-04 LAB — COMPREHENSIVE METABOLIC PANEL
ALK PHOS: 98 U/L (ref 38–126)
ALT: 35 U/L (ref 0–44)
ANION GAP: 14 (ref 5–15)
AST: 47 U/L — ABNORMAL HIGH (ref 15–41)
Albumin: 2.6 g/dL — ABNORMAL LOW (ref 3.5–5.0)
BUN: 5 mg/dL — ABNORMAL LOW (ref 8–23)
CALCIUM: 8.5 mg/dL — AB (ref 8.9–10.3)
CO2: 27 mmol/L (ref 22–32)
Chloride: 98 mmol/L (ref 98–111)
Creatinine, Ser: 1.03 mg/dL — ABNORMAL HIGH (ref 0.44–1.00)
GFR calc Af Amer: 59 mL/min — ABNORMAL LOW (ref >60)
GFR calc non Af Amer: 51 mL/min — ABNORMAL LOW (ref >60)
Glucose, Bld: 349 mg/dL — ABNORMAL HIGH (ref 70–99)
Potassium: 3.2 mmol/L — ABNORMAL LOW (ref 3.5–5.1)
SODIUM: 139 mmol/L (ref 135–145)
Total Bilirubin: 0.5 mg/dL (ref 0.3–1.2)
Total Protein: 5.6 g/dL — ABNORMAL LOW (ref 6.5–8.1)

## 2018-07-04 LAB — CBC
HCT: 39.4 % (ref 36.0–46.0)
HEMOGLOBIN: 13.3 g/dL (ref 12.0–15.0)
MCH: 29.4 pg (ref 26.0–34.0)
MCHC: 33.8 g/dL (ref 30.0–36.0)
MCV: 87.2 fL (ref 78.0–100.0)
Platelets: 307 10*3/uL (ref 150–400)
RBC: 4.52 MIL/uL (ref 3.87–5.11)
RDW: 12.8 % (ref 11.5–15.5)
WBC: 8.9 10*3/uL (ref 4.0–10.5)

## 2018-07-04 LAB — SURGICAL PCR SCREEN
MRSA, PCR: NEGATIVE
STAPHYLOCOCCUS AUREUS: NEGATIVE

## 2018-07-04 LAB — TYPE AND SCREEN
ABO/RH(D): O POS
Antibody Screen: NEGATIVE

## 2018-07-04 LAB — PROTIME-INR
INR: 0.96
Prothrombin Time: 12.7 seconds (ref 11.4–15.2)

## 2018-07-04 LAB — GLUCOSE, CAPILLARY: Glucose-Capillary: 353 mg/dL — ABNORMAL HIGH (ref 70–99)

## 2018-07-04 LAB — APTT: APTT: 27 s (ref 24–36)

## 2018-07-04 LAB — HEMOGLOBIN A1C
Hgb A1c MFr Bld: 10.1 % — ABNORMAL HIGH (ref 4.8–5.6)
Mean Plasma Glucose: 243.17 mg/dL

## 2018-07-04 NOTE — Progress Notes (Addendum)
PCP - Nelda Bucks Cardiologist - Shirlee More  EKG - 06/26/18 ECHO - 06/30/18  Sleep Study - pt states she has sleep apnea but does not wear a CPAP  Fasting Blood Sugar - Pt reports checking her CBG 2 times a day (AM and PM). Pt states her blood sugar is usually in th e 200s. CBG today 353. Pt states she just had a ham biscuit and a little bit of coke. Pt takes her insulin at night and did take it last night. Pt states she has lost a lot of weight recently. Pt educated on keeping a close eye on her diet and staying away from extra sugar prior to surgery. Pt and pt son told to contact PCP about elevated blood sugars and upcoming surgery.  When I asked patient about her Pletal, Pt states that the pharmacy stopped this "blood thinner". Pt reports that she didn't even know she was on a blood thinner, but she has stopped this medicine. Pt and son given instructions to contact Dr. Delena Bali as well as Dr. Trula Slade about this to clarify whether she is needing to take Pletal or stop Pletal prior to surgery..per orders note by Domenic Moras in epic, pt was to hold Pletal for 1 week prior to surgery. Sammy, pt son notified of this.  Anesthesia review: Cardiac tests, CBG 353 at PAT, follow up records requested from Dr. Delena Bali regarding diabetes.   Patient denies shortness of breath, fever, cough and chest pain at PAT appointment   Patient verbalized understanding of instructions that were given to them at the PAT appointment. Patient was also instructed that they will need to review over the PAT instructions again at home before surgery.

## 2018-07-07 NOTE — Progress Notes (Signed)
Anesthesia Chart Review:  Case:  [id number] Date/Time:  07/09/18 0815   Procedure:  RE-DO RIGHT FEMORAL ARTERY TO BELOW THE KNEE POPLITEAL ARTERY BYPASS GRAFT (Right )   Anesthesia type:  General   Pre-op diagnosis:  PERIPHERAL VASCULAR DISEASE   Location:  [facility] OR ROOM 65 / [facility]   Surgeon:  Serafina Mitchell, MD      DISCUSSION: 77 yo female former smoker for above procedure. Pertinent hx includes IDDM, Depression, OSA not on CPAP, GERD, Hypothyroid, COPD, PVD (s/p aortofemoral bypass).  Cardiac clearance by Dr Bettina Gavia 06/30/2018 stating "Echo shows normal LV function, proceed with planned surgery.". Echo 2019 shows EF 60-65%, nl wall motion, nl valves, grade 1 dd.   Pt to hold Pletal 1 week prior to surgery.  Poorly controlled IDDM. At PAT appt CBG 353, A1c 10.1. Pt was instructed by PAT nurse to contact PCP for f/u regarding poor BG control.  Anticipate she can proceed with surgery as planned barring acute status change and DOS labs acceptable.   VS: BP (!) 159/61   Pulse 62   Temp 36.8 C (Oral)   Resp 18   Ht 5\' 2"  (1.575 m)   Wt 44.7 kg   SpO2 100%   BMI 18.02 kg/m   PROVIDERS: Nicoletta Dress, MD is PCP  Shirlee More, MD is Cardiologist last seen 06/26/2018  LABS: Uncontrolled IDDM. Results forwarded to PCP and called to surgeon's office. (all labs ordered are listed, but only abnormal results are displayed)  Labs Reviewed  GLUCOSE, CAPILLARY - Abnormal; Notable for the following components:      Result Value   Glucose-Capillary 353 (*)    All other components within normal limits  COMPREHENSIVE METABOLIC PANEL - Abnormal; Notable for the following components:   Potassium 3.2 (*)    Glucose, Bld 349 (*)    BUN 5 (*)    Creatinine, Ser 1.03 (*)    Calcium 8.5 (*)    Total Protein 5.6 (*)    Albumin 2.6 (*)    AST 47 (*)    GFR calc non Af Amer 51 (*)    GFR calc Af Amer 59 (*)    All other components within normal limits  URINALYSIS, ROUTINE W REFLEX MICROSCOPIC  - Abnormal; Notable for the following components:   Glucose, UA >=500 (*)    Hgb urine dipstick SMALL (*)    Protein, ur 100 (*)    Bacteria, UA RARE (*)    All other components within normal limits  HEMOGLOBIN A1C - Abnormal; Notable for the following components:   Hgb A1c MFr Bld 10.1 (*)    All other components within normal limits  SURGICAL PCR SCREEN  APTT  CBC  PROTIME-INR  TYPE AND SCREEN     IMAGES: N/A   EKG: 06/26/2018: NSR. Incomplete RBBB. Septal infarct, age undetermined.   CV: TTE 06/30/2018: Study Conclusions  - Left ventricle: The cavity size was normal. Wall thickness was   increased in a pattern of mild LVH. Systolic function was normal.   The estimated ejection fraction was in the range of 60% to 65%.   Wall motion was normal; there were no regional wall motion   abnormalities. Doppler parameters are consistent with abnormal   left ventricular relaxation (grade 1 diastolic dysfunction).  Past Medical History:  Diagnosis Date  . AAA (abdominal aortic aneurysm) ([place]) 02/26/2012  . Arthritis   . 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 Blood transfusion 2011  . Cancer (HCC)    BREAST  . Claudication of both lower extremities ([place]) 05/28/2017  . COPD (chronic obstructive pulmonary disease) ([place])   . Depression   . Diabetes mellitus    type 2 IDDM x 10 years  . Diabetes mellitus with peripheral vascular disease ([place]) 05/28/2017  . GERD (gastroesophageal reflux disease)   . Hip pain   . Hypertension   . Hypothyroidism   . Iliac artery occlusion ([place]) 02/26/2012  . Myocardial infarction [facility])    unsure, looks as if she may have had one in the past  . Peripheral vascular disease ([place])   . PVD (peripheral vascular disease) with claudication ([place]) 02/26/2012  . Sleep apnea    does not use cpap   . Type II or unspecified type diabetes mellitus with peripheral circulatory disorders,  uncontrolled(250.72) 06/15/2014    Past Surgical History:  Procedure Laterality Date  . ABDOMINAL ANGIOGRAM N/A 01/31/2012   Procedure: ABDOMINAL ANGIOGRAM;  Surgeon: Conrad Moscow, MD;  Location: [facility] C[facility] LAB;  Service: Cardiovascular;  Laterality: N/A;  . ABDOMINAL AORTOGRAM N/A 07/16/2017   Procedure: ABDOMINAL AORTOGRAM;  Surgeon: Serafina Mitchell, MD;  Location: [place] CV LAB;  Service: Cardiovascular;  Laterality: N/A;  . ABDOMINAL AORTOGRAM W/LOWER EXTREMITY N/A 06/24/2018   Procedure: ABDOMINAL AORTOGRAM W/LOWER EXTREMITY;  Surgeon: Serafina Mitchell, MD;  Location: [place] CV LAB;  Service: Cardiovascular;  Laterality: N/A;  . APPENDECTOMY    . BREAST SURGERY  2009   lumpectomy Rt breast  . CHOLECYSTECTOMY    . FEET SURGERY    . LOWER EXTREMITY ANGIOGRAPHY Bilateral 07/16/2017   Procedure: Lower Extremity Angiography;  Surgeon: Serafina Mitchell, MD;  Location: [place] CV LAB;  Service: Cardiovascular;  Laterality: Bilateral;  . PR VEIN BYPASS GRAFT,AORTO-FEM-POP    . TUBAL LIGATION      MEDICATIONS: . alendronate (FOSAMAX) 70 MG tablet  . amLODipine-benazepril (LOTREL) 5-40 MG capsule  . anastrozole (ARIMIDEX) 1 MG tablet  . atenolol (TENORMIN) 100 MG tablet  . atorvastatin (LIPITOR) 40 MG tablet  . cilostazol (PLETAL) 100 MG tablet  . fenofibrate 160 MG tablet  . gabapentin (NEURONTIN) 100 MG capsule  . gabapentin (NEURONTIN) 100 MG capsule  . Insulin Glargine (LANTUS SOLOSTAR) 100 UNIT/ML Solostar Pen  . lansoprazole (PREVACID) 30 MG capsule  . levothyroxine (SYNTHROID, LEVOTHROID) 112 MCG tablet  . losartan (COZAAR) 100 MG tablet  . oxyCODONE-acetaminophen (PERCOCET/ROXICET) 5-325 MG tablet   No current facility-administered medications for this encounter.     Wynonia Musty [facility] Short Stay Center/Anesthesiology Phone [phone] 07/07/2018 10:58 AM

## 2018-07-09 ENCOUNTER — Inpatient Hospital Stay (HOSPITAL_COMMUNITY)
Admission: RE | Admit: 2018-07-09 | Discharge: 2018-07-16 | DRG: 253 | Disposition: A | Discharge status: Home Health | Payer: Medicare HMO | Source: Ambulatory Visit | Attending: Surgery | Admitting: Surgery

## 2018-07-09 ENCOUNTER — Inpatient Hospital Stay (HOSPITAL_COMMUNITY): Payer: Medicare HMO | Admitting: Certified Registered Nurse Anesthetist

## 2018-07-09 ENCOUNTER — Encounter (HOSPITAL_COMMUNITY): Payer: Self-pay | Admitting: Certified Registered Nurse Anesthetist

## 2018-07-09 ENCOUNTER — Encounter (HOSPITAL_COMMUNITY)
Admission: RE | Disposition: A | Discharge status: Home Health | Payer: Self-pay | Source: Ambulatory Visit | Attending: Surgery

## 2018-07-09 ENCOUNTER — Inpatient Hospital Stay (HOSPITAL_COMMUNITY): Payer: Medicare HMO | Admitting: Physician Assistant

## 2018-07-09 DIAGNOSIS — Z79890 Hormone replacement therapy (postmenopausal): Secondary | ICD-10-CM

## 2018-07-09 DIAGNOSIS — F17210 Nicotine dependence, cigarettes, uncomplicated: Secondary | ICD-10-CM | POA: Diagnosis not present

## 2018-07-09 DIAGNOSIS — Z9889 Other specified postprocedural states: Secondary | ICD-10-CM | POA: Diagnosis not present

## 2018-07-09 DIAGNOSIS — Z79899 Other long term (current) drug therapy: Secondary | ICD-10-CM

## 2018-07-09 DIAGNOSIS — F329 Major depressive disorder, single episode, unspecified: Secondary | ICD-10-CM | POA: Diagnosis present

## 2018-07-09 DIAGNOSIS — J449 Chronic obstructive pulmonary disease, unspecified: Secondary | ICD-10-CM | POA: Diagnosis present

## 2018-07-09 DIAGNOSIS — G4730 Sleep apnea, unspecified: Secondary | ICD-10-CM | POA: Diagnosis present

## 2018-07-09 DIAGNOSIS — E1165 Type 2 diabetes mellitus with hyperglycemia: Secondary | ICD-10-CM | POA: Diagnosis present

## 2018-07-09 DIAGNOSIS — E039 Hypothyroidism, unspecified: Secondary | ICD-10-CM | POA: Diagnosis present

## 2018-07-09 DIAGNOSIS — M25559 Pain in unspecified hip: Secondary | ICD-10-CM

## 2018-07-09 DIAGNOSIS — E11621 Type 2 diabetes mellitus with foot ulcer: Secondary | ICD-10-CM | POA: Diagnosis present

## 2018-07-09 DIAGNOSIS — Z9049 Acquired absence of other specified parts of digestive tract: Secondary | ICD-10-CM

## 2018-07-09 DIAGNOSIS — I70235 Atherosclerosis of native arteries of right leg with ulceration of other part of foot: Secondary | ICD-10-CM | POA: Diagnosis not present

## 2018-07-09 DIAGNOSIS — Z880 Allergy status to penicillin: Secondary | ICD-10-CM | POA: Diagnosis not present

## 2018-07-09 DIAGNOSIS — I252 Old myocardial infarction: Secondary | ICD-10-CM | POA: Diagnosis not present

## 2018-07-09 DIAGNOSIS — L97319 Non-pressure chronic ulcer of right ankle with unspecified severity: Secondary | ICD-10-CM | POA: Diagnosis not present

## 2018-07-09 DIAGNOSIS — N179 Acute kidney failure, unspecified: Secondary | ICD-10-CM | POA: Diagnosis not present

## 2018-07-09 DIAGNOSIS — Z794 Long term (current) use of insulin: Secondary | ICD-10-CM | POA: Diagnosis not present

## 2018-07-09 DIAGNOSIS — Z853 Personal history of malignant neoplasm of breast: Secondary | ICD-10-CM | POA: Diagnosis not present

## 2018-07-09 DIAGNOSIS — M25551 Pain in right hip: Secondary | ICD-10-CM | POA: Diagnosis not present

## 2018-07-09 DIAGNOSIS — E1142 Type 2 diabetes mellitus with diabetic polyneuropathy: Secondary | ICD-10-CM | POA: Diagnosis present

## 2018-07-09 DIAGNOSIS — Z95828 Presence of other vascular implants and grafts: Secondary | ICD-10-CM

## 2018-07-09 DIAGNOSIS — I10 Essential (primary) hypertension: Secondary | ICD-10-CM | POA: Diagnosis not present

## 2018-07-09 DIAGNOSIS — E1151 Type 2 diabetes mellitus with diabetic peripheral angiopathy without gangrene: Principal | ICD-10-CM | POA: Diagnosis present

## 2018-07-09 DIAGNOSIS — E11649 Type 2 diabetes mellitus with hypoglycemia without coma: Secondary | ICD-10-CM | POA: Diagnosis not present

## 2018-07-09 DIAGNOSIS — Z8249 Family history of ischemic heart disease and other diseases of the circulatory system: Secondary | ICD-10-CM

## 2018-07-09 DIAGNOSIS — K219 Gastro-esophageal reflux disease without esophagitis: Secondary | ICD-10-CM | POA: Diagnosis present

## 2018-07-09 DIAGNOSIS — D62 Acute posthemorrhagic anemia: Secondary | ICD-10-CM | POA: Diagnosis not present

## 2018-07-09 DIAGNOSIS — I739 Peripheral vascular disease, unspecified: Secondary | ICD-10-CM

## 2018-07-09 DIAGNOSIS — M1990 Unspecified osteoarthritis, unspecified site: Secondary | ICD-10-CM | POA: Diagnosis not present

## 2018-07-09 HISTORY — PX: FEMORAL-POPLITEAL BYPASS GRAFT: SHX937

## 2018-07-09 HISTORY — PX: VEIN HARVEST: SHX6363

## 2018-07-09 HISTORY — PX: GROIN DISSECTION: SHX5250

## 2018-07-09 LAB — CREATININE, SERUM
Creatinine, Ser: 0.91 mg/dL (ref 0.44–1.00)
GFR calc Af Amer: >60 mL/min (ref >60)
GFR calc non Af Amer: 59 mL/min — ABNORMAL LOW (ref >60)

## 2018-07-09 LAB — GLUCOSE, CAPILLARY
GLUCOSE-CAPILLARY: 49 mg/dL — AB (ref 70–99)
GLUCOSE-CAPILLARY: 43 mg/dL — AB (ref 70–99)
GLUCOSE-CAPILLARY: 120 mg/dL — AB (ref 70–99)
GLUCOSE-CAPILLARY: 121 mg/dL — AB (ref 70–99)
GLUCOSE-CAPILLARY: 163 mg/dL — AB (ref 70–99)
Glucose-Capillary: 113 mg/dL — ABNORMAL HIGH (ref 70–99)
Glucose-Capillary: 145 mg/dL — ABNORMAL HIGH (ref 70–99)
Glucose-Capillary: 122 mg/dL — ABNORMAL HIGH (ref 70–99)
Glucose-Capillary: 151 mg/dL — ABNORMAL HIGH (ref 70–99)
Glucose-Capillary: 116 mg/dL — ABNORMAL HIGH (ref 70–99)
Glucose-Capillary: 105 mg/dL — ABNORMAL HIGH (ref 70–99)
Glucose-Capillary: 136 mg/dL — ABNORMAL HIGH (ref 70–99)
Glucose-Capillary: 251 mg/dL — ABNORMAL HIGH (ref 70–99)

## 2018-07-09 LAB — CBC
HCT: 30.8 % — ABNORMAL LOW (ref 36.0–46.0)
Hemoglobin: 10.2 g/dL — ABNORMAL LOW (ref 12.0–15.0)
MCH: 28.9 pg (ref 26.0–34.0)
MCHC: 33.1 g/dL (ref 30.0–36.0)
MCV: 87.3 fL (ref 78.0–100.0)
PLATELETS: 281 10*3/uL (ref 150–400)
RBC: 3.53 MIL/uL — AB (ref 3.87–5.11)
RDW: 13.3 % (ref 11.5–15.5)
WBC: 11.8 10*3/uL — AB (ref 4.0–10.5)

## 2018-07-09 SURGERY — BYPASS GRAFT FEMORAL-POPLITEAL ARTERY
Anesthesia: General | Site: Leg Upper | Laterality: Right

## 2018-07-09 MED ORDER — BENAZEPRIL HCL 40 MG PO TABS
40.0000 mg | ORAL_TABLET | Freq: Every day | ORAL | Status: DC
  Administered 2018-07-09 – 2018-07-10 (×2): 40 mg via ORAL
  Filled 2018-07-09 (×2): qty 1

## 2018-07-09 MED ORDER — AMLODIPINE BESYLATE 5 MG PO TABS
5.0000 mg | ORAL_TABLET | Freq: Every day | ORAL | Status: DC
  Administered 2018-07-09 – 2018-07-15 (×7): 5 mg via ORAL
  Filled 2018-07-09 (×7): qty 1

## 2018-07-09 MED ORDER — HYDRALAZINE HCL 20 MG/ML IJ SOLN
5.0000 mg | INTRAMUSCULAR | Status: AC | PRN
  Administered 2018-07-13 (×2): 5 mg via INTRAVENOUS
  Filled 2018-07-09 (×2): qty 1

## 2018-07-09 MED ORDER — VANCOMYCIN HCL IN DEXTROSE 1-5 GM/200ML-% IV SOLN
1000.0000 mg | INTRAVENOUS | Status: AC
  Administered 2018-07-09: 1000 mg via INTRAVENOUS
  Filled 2018-07-09: qty 200

## 2018-07-09 MED ORDER — ALENDRONATE SODIUM 70 MG PO TABS: 70.0000 mg | ORAL_TABLET | ORAL | Status: DC

## 2018-07-09 MED ORDER — ONDANSETRON HCL 4 MG/2ML IJ SOLN
INTRAMUSCULAR | Status: AC
  Filled 2018-07-09: qty 2

## 2018-07-09 MED ORDER — OXYCODONE-ACETAMINOPHEN 5-325 MG PO TABS
1.0000 | ORAL_TABLET | Freq: Four times a day (QID) | ORAL | Status: DC | PRN
  Administered 2018-07-10 – 2018-07-15 (×9): 1 via ORAL
  Filled 2018-07-09 (×9): qty 1

## 2018-07-09 MED ORDER — INSULIN ASPART 100 UNIT/ML ~~LOC~~ SOLN
0.0000 [IU] | Freq: Three times a day (TID) | SUBCUTANEOUS | Status: DC
  Administered 2018-07-10: 5 [IU] via SUBCUTANEOUS
  Administered 2018-07-12: 2 [IU] via SUBCUTANEOUS
  Administered 2018-07-14: 1 [IU] via SUBCUTANEOUS
  Administered 2018-07-14: 2 [IU] via SUBCUTANEOUS

## 2018-07-09 MED ORDER — HYDROMORPHONE HCL 1 MG/ML IJ SOLN
0.5000 mg | INTRAMUSCULAR | Status: DC | PRN
  Administered 2018-07-10 (×2): 1 mg via INTRAVENOUS
  Administered 2018-07-11: 0.5 mg via INTRAVENOUS
  Administered 2018-07-12 (×3): 1 mg via INTRAVENOUS
  Filled 2018-07-09 (×7): qty 1

## 2018-07-09 MED ORDER — ALUM & MAG HYDROXIDE-SIMETH 200-200-20 MG/5ML PO SUSP: 15.0000 mL | ORAL | Status: DC | PRN

## 2018-07-09 MED ORDER — ALBUMIN HUMAN 5 % IV SOLN
INTRAVENOUS | Status: DC | PRN
  Administered 2018-07-09: 10:00:00 via INTRAVENOUS

## 2018-07-09 MED ORDER — DEXTROSE-NACL 5-0.2 % IV SOLN: INTRAVENOUS | Status: DC | PRN

## 2018-07-09 MED ORDER — PROPOFOL 10 MG/ML IV BOLUS
INTRAVENOUS | Status: AC
  Filled 2018-07-09: qty 20

## 2018-07-09 MED ORDER — SENNOSIDES-DOCUSATE SODIUM 8.6-50 MG PO TABS: 1.0000 | ORAL_TABLET | Freq: Every evening | ORAL | Status: DC | PRN

## 2018-07-09 MED ORDER — HEMOSTATIC AGENTS (NO CHARGE) OPTIME
TOPICAL | Status: DC | PRN
  Administered 2018-07-09: 2 via TOPICAL

## 2018-07-09 MED ORDER — PANTOPRAZOLE SODIUM 40 MG PO TBEC
40.0000 mg | DELAYED_RELEASE_TABLET | Freq: Every day | ORAL | Status: DC
  Administered 2018-07-10 – 2018-07-16 (×7): 40 mg via ORAL
  Filled 2018-07-09 (×7): qty 1

## 2018-07-09 MED ORDER — LIDOCAINE 2% (20 MG/ML) 5 ML SYRINGE
INTRAMUSCULAR | Status: DC | PRN
  Administered 2018-07-09: 40 mg via INTRAVENOUS

## 2018-07-09 MED ORDER — FENTANYL CITRATE (PF) 100 MCG/2ML IJ SOLN: 25.0000 ug | INTRAMUSCULAR | Status: DC | PRN

## 2018-07-09 MED ORDER — ATORVASTATIN CALCIUM 40 MG PO TABS
40.0000 mg | ORAL_TABLET | Freq: Every day | ORAL | Status: DC
  Administered 2018-07-09 – 2018-07-15 (×7): 40 mg via ORAL
  Filled 2018-07-09 (×4): qty 1
  Filled 2018-07-09: qty 2
  Filled 2018-07-09 (×2): qty 1

## 2018-07-09 MED ORDER — CHLORHEXIDINE GLUCONATE 4 % EX LIQD: 60.0000 mL | Freq: Once | CUTANEOUS | Status: DC

## 2018-07-09 MED ORDER — KCL IN DEXTROSE-NACL 20-5-0.45 MEQ/L-%-% IV SOLN
INTRAVENOUS | Status: DC
  Administered 2018-07-09 – 2018-07-10 (×2): via INTRAVENOUS
  Filled 2018-07-09 (×3): qty 1000

## 2018-07-09 MED ORDER — DEXTROSE 50 % IV SOLN
INTRAVENOUS | Status: AC
  Administered 2018-07-09: 25 mL
  Filled 2018-07-09: qty 50

## 2018-07-09 MED ORDER — GABAPENTIN 100 MG PO CAPS: 100.0000 mg | ORAL_CAPSULE | Freq: Two times a day (BID) | ORAL | Status: DC

## 2018-07-09 MED ORDER — POTASSIUM CHLORIDE CRYS ER 20 MEQ PO TBCR: 20.0000 meq | EXTENDED_RELEASE_TABLET | Freq: Every day | ORAL | Status: DC | PRN

## 2018-07-09 MED ORDER — CILOSTAZOL 100 MG PO TABS
100.0000 mg | ORAL_TABLET | Freq: Two times a day (BID) | ORAL | Status: DC
  Administered 2018-07-09 – 2018-07-12 (×7): 100 mg via ORAL
  Filled 2018-07-09 (×8): qty 1

## 2018-07-09 MED ORDER — ANASTROZOLE 1 MG PO TABS
1.0000 mg | ORAL_TABLET | Freq: Every day | ORAL | Status: DC
  Administered 2018-07-10 – 2018-07-16 (×7): 1 mg via ORAL
  Filled 2018-07-09 (×7): qty 1

## 2018-07-09 MED ORDER — GLYCOPYRROLATE PF 0.2 MG/ML IJ SOSY
PREFILLED_SYRINGE | INTRAMUSCULAR | Status: AC
  Filled 2018-07-09: qty 1

## 2018-07-09 MED ORDER — ROCURONIUM BROMIDE 10 MG/ML (PF) SYRINGE
PREFILLED_SYRINGE | INTRAVENOUS | Status: DC | PRN
  Administered 2018-07-09: 40 mg via INTRAVENOUS
  Administered 2018-07-09 (×3): 10 mg via INTRAVENOUS

## 2018-07-09 MED ORDER — PROPOFOL 10 MG/ML IV BOLUS
INTRAVENOUS | Status: DC | PRN
  Administered 2018-07-09 (×2): 20 mg via INTRAVENOUS

## 2018-07-09 MED ORDER — GABAPENTIN 100 MG PO CAPS
100.0000 mg | ORAL_CAPSULE | Freq: Two times a day (BID) | ORAL | Status: DC
  Administered 2018-07-09 – 2018-07-16 (×14): 100 mg via ORAL
  Filled 2018-07-09 (×15): qty 1

## 2018-07-09 MED ORDER — ONDANSETRON HCL 4 MG/2ML IJ SOLN: 4.0000 mg | Freq: Four times a day (QID) | INTRAMUSCULAR | Status: DC | PRN

## 2018-07-09 MED ORDER — FENOFIBRATE 160 MG PO TABS
160.0000 mg | ORAL_TABLET | Freq: Every day | ORAL | Status: DC
  Administered 2018-07-09 – 2018-07-16 (×8): 160 mg via ORAL
  Filled 2018-07-09 (×9): qty 1

## 2018-07-09 MED ORDER — DEXAMETHASONE SODIUM PHOSPHATE 10 MG/ML IJ SOLN
INTRAMUSCULAR | Status: AC
  Filled 2018-07-09: qty 1

## 2018-07-09 MED ORDER — GUAIFENESIN-DM 100-10 MG/5ML PO SYRP: 15.0000 mL | ORAL_SOLUTION | ORAL | Status: DC | PRN

## 2018-07-09 MED ORDER — SODIUM CHLORIDE 0.9 % IV SOLN
INTRAVENOUS | Status: DC | PRN
  Administered 2018-07-09: 25 ug/min via INTRAVENOUS

## 2018-07-09 MED ORDER — LOSARTAN POTASSIUM 50 MG PO TABS
100.0000 mg | ORAL_TABLET | Freq: Every day | ORAL | Status: DC
  Administered 2018-07-09 – 2018-07-10 (×2): 100 mg via ORAL
  Filled 2018-07-09 (×2): qty 2

## 2018-07-09 MED ORDER — FENTANYL CITRATE (PF) 250 MCG/5ML IJ SOLN
INTRAMUSCULAR | Status: AC
  Filled 2018-07-09: qty 5

## 2018-07-09 MED ORDER — SUGAMMADEX SODIUM 200 MG/2ML IV SOLN
INTRAVENOUS | Status: DC | PRN
  Administered 2018-07-09: 100 mg via INTRAVENOUS

## 2018-07-09 MED ORDER — BISACODYL 5 MG PO TBEC: 5.0000 mg | DELAYED_RELEASE_TABLET | Freq: Every day | ORAL | Status: DC | PRN

## 2018-07-09 MED ORDER — LACTATED RINGERS IV SOLN
INTRAVENOUS | Status: DC
  Administered 2018-07-09 (×2): via INTRAVENOUS

## 2018-07-09 MED ORDER — METOPROLOL TARTRATE 5 MG/5ML IV SOLN: 2.0000 mg | INTRAVENOUS | Status: DC | PRN

## 2018-07-09 MED ORDER — HEPARIN SODIUM (PORCINE) 1000 UNIT/ML IJ SOLN
INTRAMUSCULAR | Status: AC
  Filled 2018-07-09: qty 1

## 2018-07-09 MED ORDER — LEVOTHYROXINE SODIUM 112 MCG PO TABS
112.0000 ug | ORAL_TABLET | Freq: Every day | ORAL | Status: DC
  Administered 2018-07-10 – 2018-07-16 (×7): 112 ug via ORAL
  Filled 2018-07-09 (×8): qty 1

## 2018-07-09 MED ORDER — ROCURONIUM BROMIDE 50 MG/5ML IV SOSY
PREFILLED_SYRINGE | INTRAVENOUS | Status: AC
  Filled 2018-07-09: qty 5

## 2018-07-09 MED ORDER — ATENOLOL 25 MG PO TABS
100.0000 mg | ORAL_TABLET | Freq: Every day | ORAL | Status: DC
  Administered 2018-07-12 – 2018-07-16 (×5): 100 mg via ORAL
  Filled 2018-07-09 (×5): qty 4

## 2018-07-09 MED ORDER — VANCOMYCIN HCL IN DEXTROSE 750-5 MG/150ML-% IV SOLN
750.0000 mg | Freq: Once | INTRAVENOUS | Status: AC
  Administered 2018-07-10: 750 mg via INTRAVENOUS
  Filled 2018-07-09: qty 150

## 2018-07-09 MED ORDER — FENTANYL CITRATE (PF) 250 MCG/5ML IJ SOLN
INTRAMUSCULAR | Status: DC | PRN
  Administered 2018-07-09: 100 ug via INTRAVENOUS
  Administered 2018-07-09: 50 ug via INTRAVENOUS

## 2018-07-09 MED ORDER — PHENOL 1.4 % MT LIQD: 1.0000 | OROMUCOSAL | Status: DC | PRN

## 2018-07-09 MED ORDER — SODIUM CHLORIDE 0.9 % IV SOLN
INTRAVENOUS | Status: DC | PRN
  Administered 2018-07-09: 10:00:00

## 2018-07-09 MED ORDER — INSULIN GLARGINE 100 UNIT/ML ~~LOC~~ SOLN
30.0000 [IU] | Freq: Every day | SUBCUTANEOUS | Status: DC
  Administered 2018-07-09 – 2018-07-14 (×3): 30 [IU] via SUBCUTANEOUS
  Filled 2018-07-09 (×7): qty 0.3

## 2018-07-09 MED ORDER — LIDOCAINE 2% (20 MG/ML) 5 ML SYRINGE
INTRAMUSCULAR | Status: AC
  Filled 2018-07-09: qty 5

## 2018-07-09 MED ORDER — AMLODIPINE BESY-BENAZEPRIL HCL 5-40 MG PO CAPS: 1.0000 | ORAL_CAPSULE | Freq: Every day | ORAL | Status: DC

## 2018-07-09 MED ORDER — EPHEDRINE 5 MG/ML INJ
INTRAVENOUS | Status: AC
  Filled 2018-07-09: qty 10

## 2018-07-09 MED ORDER — DEXTROSE 5 % IV SOLN
INTRAVENOUS | Status: DC | PRN
  Administered 2018-07-09: 10:00:00 via INTRAVENOUS

## 2018-07-09 MED ORDER — MAGNESIUM SULFATE 2 GM/50ML IV SOLN: 2.0000 g | Freq: Every day | INTRAVENOUS | Status: DC | PRN

## 2018-07-09 MED ORDER — OXYCODONE HCL 5 MG/5ML PO SOLN: 5.0000 mg | Freq: Once | ORAL | Status: DC | PRN

## 2018-07-09 MED ORDER — OXYCODONE HCL 5 MG PO TABS
5.0000 mg | ORAL_TABLET | ORAL | Status: DC | PRN
  Administered 2018-07-09 – 2018-07-13 (×8): 10 mg via ORAL
  Filled 2018-07-09 (×8): qty 2

## 2018-07-09 MED ORDER — SODIUM CHLORIDE 0.9 % IV SOLN: 500.0000 mL | Freq: Once | INTRAVENOUS | Status: DC | PRN

## 2018-07-09 MED ORDER — ONDANSETRON HCL 4 MG/2ML IJ SOLN
INTRAMUSCULAR | Status: DC | PRN
  Administered 2018-07-09: 4 mg via INTRAVENOUS

## 2018-07-09 MED ORDER — LABETALOL HCL 5 MG/ML IV SOLN: 10.0000 mg | INTRAVENOUS | Status: DC | PRN

## 2018-07-09 MED ORDER — DEXAMETHASONE SODIUM PHOSPHATE 10 MG/ML IJ SOLN
INTRAMUSCULAR | Status: DC | PRN
  Administered 2018-07-09: 5 mg via INTRAVENOUS

## 2018-07-09 MED ORDER — SODIUM CHLORIDE 0.9 % IV SOLN: INTRAVENOUS | Status: DC

## 2018-07-09 MED ORDER — PROTAMINE SULFATE 10 MG/ML IV SOLN
INTRAVENOUS | Status: DC | PRN
  Administered 2018-07-09: 50 mg via INTRAVENOUS

## 2018-07-09 MED ORDER — DEXTROSE 50 % IV SOLN
INTRAVENOUS | Status: DC | PRN
  Administered 2018-07-09: 12.5 g via INTRAVENOUS

## 2018-07-09 MED ORDER — OXYCODONE HCL 5 MG PO TABS: 5.0000 mg | ORAL_TABLET | Freq: Once | ORAL | Status: DC | PRN

## 2018-07-09 MED ORDER — EPHEDRINE SULFATE-NACL 50-0.9 MG/10ML-% IV SOSY
PREFILLED_SYRINGE | INTRAVENOUS | Status: DC | PRN
  Administered 2018-07-09 (×5): 5 mg via INTRAVENOUS

## 2018-07-09 MED ORDER — HEPARIN SODIUM (PORCINE) 5000 UNIT/ML IJ SOLN
5000.0000 [IU] | Freq: Three times a day (TID) | INTRAMUSCULAR | Status: DC
  Administered 2018-07-10 – 2018-07-16 (×19): 5000 [IU] via SUBCUTANEOUS
  Filled 2018-07-09 (×17): qty 1

## 2018-07-09 MED ORDER — DOCUSATE SODIUM 100 MG PO CAPS
100.0000 mg | ORAL_CAPSULE | Freq: Every day | ORAL | Status: DC
  Administered 2018-07-10 – 2018-07-16 (×7): 100 mg via ORAL
  Filled 2018-07-09 (×7): qty 1

## 2018-07-09 MED ORDER — GLYCOPYRROLATE PF 0.2 MG/ML IJ SOSY
PREFILLED_SYRINGE | INTRAMUSCULAR | Status: DC | PRN
  Administered 2018-07-09: .2 mg via INTRAVENOUS

## 2018-07-09 MED ORDER — PROTAMINE SULFATE 10 MG/ML IV SOLN
INTRAVENOUS | Status: AC
  Filled 2018-07-09: qty 5

## 2018-07-09 MED ORDER — SODIUM CHLORIDE 0.9 % IV SOLN
INTRAVENOUS | Status: AC
  Filled 2018-07-09: qty 1.2

## 2018-07-09 MED ORDER — 0.9 % SODIUM CHLORIDE (POUR BTL) OPTIME
TOPICAL | Status: DC | PRN
  Administered 2018-07-09: 2000 mL

## 2018-07-09 MED ORDER — HEPARIN SODIUM (PORCINE) 1000 UNIT/ML IJ SOLN
INTRAMUSCULAR | Status: DC | PRN
  Administered 2018-07-09: 2000 [IU] via INTRAVENOUS
  Administered 2018-07-09: 5000 [IU] via INTRAVENOUS
  Administered 2018-07-09: 1000 [IU] via INTRAVENOUS

## 2018-07-09 SURGICAL SUPPLY — 72 items
ADH SKN CLS APL DERMABOND .7 (GAUZE/BANDAGES/DRESSINGS) ×2
ADH SKN CLS LQ APL DERMABOND (GAUZE/BANDAGES/DRESSINGS) ×2
BANDAGE ESMARK 6X9 LF (GAUZE/BANDAGES/DRESSINGS) IMPLANT
BLADE SURG 10 STRL SS (BLADE) ×2 IMPLANT
BNDG CMPR 9X6 STRL LF SNTH (GAUZE/BANDAGES/DRESSINGS) ×2
BNDG ESMARK 6X9 LF (GAUZE/BANDAGES/DRESSINGS) ×4
CANISTER SUCT 3000ML PPV (MISCELLANEOUS) ×4 IMPLANT
CANNULA VESSEL 3MM 2 BLNT TIP (CANNULA) ×4 IMPLANT
CATH EMB 3FR 40CM (CATHETERS) ×2 IMPLANT
CLIP VESOCCLUDE MED 24/CT (CLIP) ×4 IMPLANT
CLIP VESOCCLUDE SM WIDE 24/CT (CLIP) ×6 IMPLANT
COVER PROBE W GEL 5X96 (DRAPES) ×2 IMPLANT
CUFF TOURNIQUET SINGLE 24IN (TOURNIQUET CUFF) ×2 IMPLANT
CUFF TOURNIQUET SINGLE 34IN LL (TOURNIQUET CUFF) IMPLANT
CUFF TOURNIQUET SINGLE 44IN (TOURNIQUET CUFF) IMPLANT
DERMABOND ADHESIVE PROPEN (GAUZE/BANDAGES/DRESSINGS) ×2
DERMABOND ADVANCED (GAUZE/BANDAGES/DRESSINGS) ×2
DERMABOND ADVANCED .7 DNX12 (GAUZE/BANDAGES/DRESSINGS) ×2 IMPLANT
DERMABOND ADVANCED .7 DNX6 (GAUZE/BANDAGES/DRESSINGS) IMPLANT
DRAIN CHANNEL 15F RND FF W/TCR (WOUND CARE) IMPLANT
DRAPE HALF SHEET 40X57 (DRAPES) IMPLANT
DRAPE X-RAY CASS 24X20 (DRAPES) IMPLANT
DRESSING PEEL AND PLC PRVNA 13 (GAUZE/BANDAGES/DRESSINGS) IMPLANT
DRSG PEEL AND PLACE PREVENA 13 (GAUZE/BANDAGES/DRESSINGS) ×4
ELECT REM PT RETURN 9FT ADLT (ELECTROSURGICAL) ×4
ELECTRODE REM PT RTRN 9FT ADLT (ELECTROSURGICAL) ×2 IMPLANT
EVACUATOR SILICONE 100CC (DRAIN) IMPLANT
GLOVE BIO SURGEON STRL SZ7 (GLOVE) ×2 IMPLANT
GLOVE BIOGEL PI IND STRL 7.0 (GLOVE) IMPLANT
GLOVE BIOGEL PI IND STRL 7.5 (GLOVE) ×2 IMPLANT
GLOVE BIOGEL PI INDICATOR 7.0 (GLOVE) ×2
GLOVE BIOGEL PI INDICATOR 7.5 (GLOVE) ×2
GLOVE SURG SS PI 7.5 STRL IVOR (GLOVE) ×4 IMPLANT
GOWN STRL REUS W/ TWL LRG LVL3 (GOWN DISPOSABLE) ×4 IMPLANT
GOWN STRL REUS W/ TWL XL LVL3 (GOWN DISPOSABLE) ×2 IMPLANT
GOWN STRL REUS W/TWL LRG LVL3 (GOWN DISPOSABLE) ×16
GOWN STRL REUS W/TWL XL LVL3 (GOWN DISPOSABLE) ×4
HEMOSTAT SNOW SURGICEL 2X4 (HEMOSTASIS) ×4 IMPLANT
INSERT FOGARTY SM (MISCELLANEOUS) IMPLANT
KIT BASIN OR (CUSTOM PROCEDURE TRAY) ×4 IMPLANT
KIT DRSG PREVENA PLUS 7DAY 125 (MISCELLANEOUS) ×2 IMPLANT
KIT TURNOVER KIT B (KITS) ×4 IMPLANT
MARKER GRAFT CORONARY BYPASS (MISCELLANEOUS) IMPLANT
NS IRRIG 1000ML POUR BTL (IV SOLUTION) ×8 IMPLANT
PACK PERIPHERAL VASCULAR (CUSTOM PROCEDURE TRAY) ×4 IMPLANT
PAD ARMBOARD 7.5X6 YLW CONV (MISCELLANEOUS) ×8 IMPLANT
SET COLLECT BLD 21X3/4 12 (NEEDLE) IMPLANT
STOPCOCK 4 WAY LG BORE MALE ST (IV SETS) IMPLANT
SUT ETHILON 3 0 PS 1 (SUTURE) IMPLANT
SUT GORETEX 6.0 TT13 (SUTURE) IMPLANT
SUT GORETEX 6.0 TT9 (SUTURE) IMPLANT
SUT PROLENE 5 0 C 1 24 (SUTURE) ×14 IMPLANT
SUT PROLENE 6 0 BV (SUTURE) ×12 IMPLANT
SUT PROLENE 7 0 BV 1 (SUTURE) IMPLANT
SUT SILK 2 0 (SUTURE) ×4
SUT SILK 2 0 SH (SUTURE) ×4 IMPLANT
SUT SILK 2-0 18XBRD TIE 12 (SUTURE) IMPLANT
SUT SILK 3 0 (SUTURE) ×4
SUT SILK 3-0 18XBRD TIE 12 (SUTURE) IMPLANT
SUT SILK 4 0 (SUTURE) ×4
SUT SILK 4-0 18XBRD TIE 12 (SUTURE) IMPLANT
SUT VIC AB 2-0 CT1 27 (SUTURE) ×12
SUT VIC AB 2-0 CT1 TAPERPNT 27 (SUTURE) ×4 IMPLANT
SUT VIC AB 3-0 SH 27 (SUTURE) ×8
SUT VIC AB 3-0 SH 27X BRD (SUTURE) ×4 IMPLANT
SUT VICRYL 4-0 PS2 18IN ABS (SUTURE) ×8 IMPLANT
SYR TB 1ML LUER SLIP (SYRINGE) ×2 IMPLANT
TOWEL GREEN STERILE (TOWEL DISPOSABLE) ×4 IMPLANT
TRAY FOLEY MTR SLVR 16FR STAT (SET/KITS/TRAYS/PACK) ×4 IMPLANT
TUBING EXTENTION W/L.L. (IV SETS) IMPLANT
UNDERPAD 30X30 (UNDERPADS AND DIAPERS) ×4 IMPLANT
WATER STERILE IRR 1000ML POUR (IV SOLUTION) ×4 IMPLANT

## 2018-07-09 NOTE — Progress Notes (Signed)
  Day of Surgery Note    Subjective:  Sleepy in pacu (just arrived)   Vitals:   07/09/18 0716  BP: (!) 200/61  Pulse: 60  Resp: 16  Temp: 97.7 F (36.5 C)  SpO2: 100%    Incisions:   pravena with good seal right groin; other incisions are clean and dry Extremities:  Brisk doppler signals right DP/PT/peroneal.   Cardiac:  regular Lungs:  Non labored    Assessment/Plan:  This is a 77 y.o. female who is s/p  Right femoral to popliteal bypass grafting  -pt with excellent doppler signals right foot -Pravena with good seal -to 4 east when bed available.     Leontine Locket, PA-C 07/09/2018 1:38 PM [phone]

## 2018-07-09 NOTE — Anesthesia Procedure Notes (Signed)
Procedure Name: Intubation Date/Time: 07/09/2018 8:48 AM Performed by: Colin Benton, CRNA Pre-anesthesia Checklist: Patient identified, Emergency Drugs available, Suction available and Patient being monitored Patient Re-evaluated:Patient Re-evaluated prior to induction Oxygen Delivery Method: Circle system utilized Preoxygenation: Pre-oxygenation with 100% oxygen Induction Type: IV induction Ventilation: Mask ventilation without difficulty and Oral airway inserted - appropriate to patient size Laryngoscope Size: Sabra Heck and 2 Grade View: Grade I Tube type: Oral Tube size: 7.0 mm Number of attempts: 1 Airway Equipment and Method: Stylet Placement Confirmation: ETT inserted through vocal cords under direct vision,  positive ETCO2 and breath sounds checked- equal and bilateral Secured at: 21 cm Tube secured with: Tape Dental Injury: Teeth and Oropharynx as per pre-operative assessment

## 2018-07-09 NOTE — Transfer of Care (Signed)
Immediate Anesthesia Transfer of Care Note  Patient: [name]  Procedure(s) Performed: RIGHT FEMORAL ARTERY TO BELOW THE KNEE POPLITEAL ARTERY BYPASS GRAFT USING SAPHENOUS VEIN (Right Leg Upper) RIGHT LEG SAPHENOUS VEIN HARVEST (Right Leg Upper) REDO RIGHT COMMON FEMORAL EXPLORATION (Right Groin)  Patient Location: PACU  Anesthesia Type:General  Level of Consciousness: drowsy  Airway & Oxygen Therapy: Patient Spontanous Breathing and Patient connected to nasal cannula oxygen  Post-op Assessment: Report given to RN and Post -op Vital signs reviewed and stable  Post vital signs: Reviewed and stable  Last Vitals:  Vitals Value Taken Time  BP 127/52 07/09/2018  1:31 PM  Temp    Pulse 58 07/09/2018  1:36 PM  Resp 14 07/09/2018  1:36 PM  SpO2 100 % 07/09/2018  1:36 PM  Vitals shown include unvalidated device data.  Last Pain:  Vitals:   07/09/18 0716  TempSrc: Oral  PainSc:          Complications: No apparent anesthesia complications

## 2018-07-09 NOTE — Op Note (Signed)
Patient name: [name] MRN: [id number] DOB: [birth year] Sex: female  07/09/2018 Pre-operative Diagnosis: Right foot ulcer Post-operative diagnosis:  Same Surgeon:  [name] Assistants: Laurence Slate Procedure:   #1: Right femoral to below-knee popliteal artery bypass graft with ipsilateral non-reversed translocated vein   #2: Redo right femoral artery exposure   #3: Placement of Praveena incisional wound VAC Anesthesia: General Blood Loss: 250 Specimens: None  Findings: Healthy appearing below-knee popliteal artery.  The vein was of good caliber measuring 3.5 mm.  The proximal anastomosis is to the anterior side of the right limb of the aortobifemoral graft  Indications: The patient has developed a right medial ankle ulcer.  She recently underwent angiography revealing an occluded right superficial femoral artery.  She comes in today for revascularization.  Procedure:  The patient was identified in the holding area and taken to [place] 16  The patient was then placed supine on the table. general anesthesia was administered.  The patient was prepped and draped in the usual sterile fashion.  A time out was called and antibiotics were administered.  Ultrasound was used to map the course of the saphenous vein in the leg.  It appeared to be an adequate vein measuring 3-4 mm throughout.  I then reopened the longitudinal right groin incision.  I used a combination of sharp and cautery dissection to dissect out the superficial femoral artery as well as to the main profunda branches.  I then isolated the right limb of the aortobifemoral graft and the common femoral artery proximal to the femoral anastomosis.  Through this incision I then dissected out the saphenofemoral junction.  I proceeded to harvest the saphenous vein through skip incisions down the leg on the medial side.  Side branches were ligated between silk ties.  The medial incision below the knee was also used to expose the  below-knee popliteal artery.  This was a 4 mm artery that was essentially noncalcified beginning 2 cm proximal to the anterior tibial artery, which I felt would be a good area for the distal anastomosis.  Once a had adequate exposure, I ligated the vein proximally distally.  The vein was prepared on the back table it distended nicely measuring 3-4 mm throughout.  It was marked for orientation.  I then used a long curved Gore tunneler to create a subsartorial tunnel.  The patient was then fully heparinized.  After the heparin circulated I occluded the right limb of the aortobifemoral graft, the common femoral artery and the profunda branches as well as the superficial femoral artery.  I used a #11 blade to open the anterior surface of the aortobifemoral graft at the level of the femoral anastomosis.  There was a small amount of thrombus here which was able to be easily removed.  There was good bleeding through the graft as well as backbleeding.  I trimmed away some of the graft to create a good graftotomy.  I then spatulated the proximal portion of the vein to fit the size of the graftotomy and a running anastomosis was created with 5-0 Prolene.  Once this was done, the anastomosis was completed.  A valvulotome was then used to lyse the valves.  2 passes were made.  There was excellent pulsatile flow through the graft.  The graft was then brought to the previously created tunnel.  I made sure to maintain proper orientation.  A tourniquet was then placed on the upper thigh.  The leg was exsanguinated with an  Esmarch and the tourniquet was taken to to 50 mm of pressure.  I then used a #11 blade to make an arteriotomy in the distal below-knee popliteal artery which was extended longitudinally with Potts scissors.  This was a healthy appearing 4 mm artery.  The leg was straightened and the vein graft was cut the appropriate length and then spatulated to fit the size of the arteriotomy.  A running anastomosis was then  created with 6-0 Prolene.  Prior to completion, the tourniquet was let down and the appropriate flushing maneuvers were performed.  The anastomosis was then completed.  The patient had a brisk anterior tibial Doppler signal which was graft dependent.  There is also a posterior tibial Doppler signal.  I was satisfied with these results.  The heparin was then reversed with protamine.  Hemostasis was achieved.  The wounds were irrigated.  The vein harvest incisions were closed with 2 layers of 3-0 Vicryl followed by Dermabond.  In the medial below-knee incision, the fascia was reapproximated with 2-0 Vicryl.  The subcutaneous tissue and skin were closed with a layer of 3-0 Vicryl each.  The groin incision was closed by reapproximating the femoral sheath with 2-0 Vicryl.  The subtenons tissue was then closed with multiple layers of 3-0 Vicryl.  Dermabond was applied to the below-knee incision, and a Praveena wound VAC was placed to the groin incision.  There were no immediate complications.  The patient was taken to recovery in stable condition.   Disposition: To PACU stable   V. [name], M.D. [facility] [place] Office: [phone] Pager:  [phone]

## 2018-07-09 NOTE — Interval H&P Note (Signed)
History and Physical Interval Note:  07/09/2018 7:24 AM  [name]  has presented today for surgery, with the diagnosis of PERIPHERAL VASCULAR DISEASE  The various methods of treatment have been discussed with the patient and family. After consideration of risks, benefits and other options for treatment, the patient has consented to  Procedure(s): RE-DO RIGHT FEMORAL ARTERY TO BELOW THE KNEE POPLITEAL ARTERY BYPASS GRAFT (Right) as a surgical intervention .  The patient's history has been reviewed, patient examined, no change in status, stable for surgery.  I have reviewed the patient's chart and labs.  Questions were answered to the patient's satisfaction.     [name]  Needs BPG for limb slavage.  Discussed with pqatient.  All questions answered

## 2018-07-09 NOTE — Anesthesia Procedure Notes (Signed)
Arterial Line Insertion Start/End9/18/2019 8:05 AM, 07/09/2018 8:15 AM Performed by: Colin Benton, CRNA, CRNA  Patient location: Pre-op. Preanesthetic checklist: patient identified, IV checked, site marked, risks and benefits discussed, surgical consent, monitors and equipment checked, pre-op evaluation, timeout performed and anesthesia consent Lidocaine 1% used for infiltration Left, radial was placed Catheter size: 20 G Hand hygiene performed , maximum sterile barriers used  and Seldinger technique used Allen's test indicative of satisfactory collateral circulation Attempts: 1 Procedure performed without using ultrasound guided technique. Following insertion, dressing applied and Biopatch. Post procedure assessment: normal and unchanged  Patient tolerated the procedure well with no immediate complications.

## 2018-07-09 NOTE — Progress Notes (Signed)
Patient arrived on the unit from PACU on a hospital bed, assessment completed see flowsheet, placed on tele ccmd notified, patient oriented to room and staff, bed in lowest position call bell within reach will monitor.

## 2018-07-09 NOTE — Anesthesia Preprocedure Evaluation (Signed)
Anesthesia Evaluation  Patient identified by MRN, date of birth, ID bandGeneral Assessment Comment:somnolent   Reviewed: Allergy & Precautions, NPO status , Patient's Chart, lab work & pertinent test results, reviewed documented beta blocker date and time   Airway Mallampati: II  TM Distance: >3 FB Neck ROM: Full    Dental  (+) Edentulous Upper, Edentulous Lower   Pulmonary sleep apnea , COPD, Current Smoker,    breath sounds clear to auscultation       Cardiovascular hypertension, Pt. on medications and Pt. on home beta blockers + Past MI  (-) CHF  Rhythm:Regular     Neuro/Psych PSYCHIATRIC DISORDERS Depression negative neurological ROS     GI/Hepatic   Endo/Other  diabetes, Poorly Controlled, Type 2, Insulin DependentHypothyroidism   Renal/GU      Musculoskeletal  (+) Arthritis ,   Abdominal   Peds  Hematology   Anesthesia Other Findings 77 yo female former smoker for above procedure. Pertinent hx includes IDDM, Depression, OSA not on CPAP, GERD, Hypothyroid, COPD, PVD (s/p aortofemoral bypass).  Cardiac clearance by Dr Bettina Gavia 06/30/2018 stating "Echo shows normal LV function, proceed with planned surgery.". Echo 2019 shows EF 60-65%, nl wall motion, nl valves, grade 1 dd.   Pt to hold Pletal 1 week prior to surgery.  Poorly controlled IDDM. At PAT appt CBG 353, A1c 10.1. Pt was instructed by PAT nurse to contact PCP for f/u regarding poor BG control.  Anticipate she can proceed with surgery as planned barring acute status change and DOS labs acceptable.    Reproductive/Obstetrics                             Anesthesia Physical Anesthesia Plan  ASA: IV  Anesthesia Plan: General   Post-op Pain Management:    Induction: Intravenous  PONV Risk Score and Plan: 3 and Ondansetron and Dexamethasone  Airway Management Planned: Oral ETT  Additional Equipment: Arterial line  Intra-op  Plan:   Post-operative Plan: Possible Post-op intubation/ventilation  Informed Consent: I have reviewed the patients History and Physical, chart, labs and discussed the procedure including the risks, benefits and alternatives for the proposed anesthesia with the patient or authorized representative who has indicated his/her understanding and acceptance.   Dental advisory given  Plan Discussed with: Surgeon and CRNA  Anesthesia Plan Comments: (Disscused risk of MI, post op vent dependence, post op respiratory failure, and poorly controlled glucose (severe hypoglycemia on arrival) with family who expressed understanding. )        Anesthesia Quick Evaluation

## 2018-07-09 NOTE — Progress Notes (Signed)
PHARMACY NOTE:  ANTIMICROBIAL RENAL DOSAGE ADJUSTMENT  Current antimicrobial regimen includes a mismatch between antimicrobial dosage and estimated renal function.  As per policy approved by the Pharmacy & Therapeutics and Medical Executive Committees, the antimicrobial dosage will be adjusted accordingly.  Current antimicrobial dosage:  Vancomycin 1gm IV Q12H x 2 doses  Indication: surgical prophylaxis  Renal Function:  Estimated Creatinine Clearance: 32.3 mL/min (A) (by C-G formula based on SCr of 1.03 mg/dL (H)). []      On intermittent HD, scheduled: []      On CRRT    Antimicrobial dosage has been changed to:  vancomycin 750mg  IV x 1 dose, 24 hours post pre-op dose  Additional comments:   Thank you for allowing pharmacy to be a part of this patient's care.  Charnita Trudel D. Mina Marble, PharmD, BCPS, [place] 07/09/2018, 3:19 PM

## 2018-07-10 ENCOUNTER — Inpatient Hospital Stay (HOSPITAL_COMMUNITY): Payer: Medicare HMO

## 2018-07-10 ENCOUNTER — Encounter (HOSPITAL_COMMUNITY): Payer: Self-pay | Admitting: Surgery

## 2018-07-10 DIAGNOSIS — Z9889 Other specified postprocedural states: Secondary | ICD-10-CM

## 2018-07-10 LAB — CBC
HEMATOCRIT: 24.1 % — AB (ref 36.0–46.0)
Hemoglobin: 7.9 g/dL — ABNORMAL LOW (ref 12.0–15.0)
MCH: 29.5 pg (ref 26.0–34.0)
MCHC: 32.8 g/dL (ref 30.0–36.0)
MCV: 89.9 fL (ref 78.0–100.0)
Platelets: 215 10*3/uL (ref 150–400)
RBC: 2.68 MIL/uL — ABNORMAL LOW (ref 3.87–5.11)
RDW: 13.5 % (ref 11.5–15.5)
WBC: 8.8 10*3/uL (ref 4.0–10.5)

## 2018-07-10 LAB — GLUCOSE, CAPILLARY
GLUCOSE-CAPILLARY: 35 mg/dL — AB (ref 70–99)
GLUCOSE-CAPILLARY: 82 mg/dL (ref 70–99)
GLUCOSE-CAPILLARY: 64 mg/dL — AB (ref 70–99)
GLUCOSE-CAPILLARY: 91 mg/dL (ref 70–99)
Glucose-Capillary: 44 mg/dL — CL (ref 70–99)
Glucose-Capillary: 274 mg/dL — ABNORMAL HIGH (ref 70–99)
Glucose-Capillary: 60 mg/dL — ABNORMAL LOW (ref 70–99)
Glucose-Capillary: 72 mg/dL (ref 70–99)
Glucose-Capillary: 72 mg/dL (ref 70–99)
Glucose-Capillary: 68 mg/dL — ABNORMAL LOW (ref 70–99)
Glucose-Capillary: 71 mg/dL (ref 70–99)
Glucose-Capillary: 75 mg/dL (ref 70–99)

## 2018-07-10 LAB — BASIC METABOLIC PANEL
ANION GAP: 7 (ref 5–15)
BUN: 11 mg/dL (ref 8–23)
CO2: 23 mmol/L (ref 22–32)
CREATININE: 1.33 mg/dL — AB (ref 0.44–1.00)
Calcium: 7 mg/dL — ABNORMAL LOW (ref 8.9–10.3)
Chloride: 103 mmol/L (ref 98–111)
GFR, EST AFRICAN AMERICAN: 43 mL/min — AB (ref >60)
GFR, EST NON AFRICAN AMERICAN: 37 mL/min — AB (ref >60)
Glucose, Bld: 313 mg/dL — ABNORMAL HIGH (ref 70–99)
Potassium: 4 mmol/L (ref 3.5–5.1)
Sodium: 133 mmol/L — ABNORMAL LOW (ref 135–145)

## 2018-07-10 LAB — POCT ACTIVATED CLOTTING TIME
ACTIVATED CLOTTING TIME: 164 s
ACTIVATED CLOTTING TIME: 241 s
ACTIVATED CLOTTING TIME: 103 s
Activated Clotting Time: 213 seconds

## 2018-07-10 LAB — HEMOGLOBIN A1C
HEMOGLOBIN A1C: 9.9 % — AB (ref 4.8–5.6)
Mean Plasma Glucose: 237 mg/dL

## 2018-07-10 MED ORDER — GLUCERNA SHAKE PO LIQD
237.0000 mL | Freq: Once | ORAL | Status: AC
  Administered 2018-07-10: 237 mL via ORAL

## 2018-07-10 MED ORDER — DEXTROSE 5 % IV SOLN
INTRAVENOUS | Status: DC
  Administered 2018-07-10 – 2018-07-11 (×2): via INTRAVENOUS

## 2018-07-10 NOTE — Progress Notes (Signed)
Results for [name], [name] (MRN [id number]) as of 07/10/2018 13:38  Ref. Range 07/09/2018 21:05 07/10/2018 06:22 07/10/2018 11:29 07/10/2018 12:28 07/10/2018 13:00  Glucose-Capillary Latest Ref Range: 70 - 99 mg/dL 251 (H) 274 (H) 60 (L) 72 72   Inpatient Diabetes Program Recommendations  AACE/ADA: New Consensus Statement on Inpatient Glycemic Control (2015)  Target Ranges:  Prepandial:   less than 140 mg/dL      Peak postprandial:   less than 180 mg/dL (1-2 hours)      Critically ill patients:  140 - 180 mg/dL   Lab Results  Component Value Date   GLUCAP 72 07/10/2018   HGBA1C 9.9 (H) 07/09/2018    Review of Glycemic Control  Diabetes history: Type 2 Outpatient Diabetes medications: Lantus 30 units daily at home Current orders for Inpatient glycemic control: Lantus 30 Units every HS, Novolog 0-9 units TID  SSI  Inpatient Diabetes Program Recommendations:    Received diabetes coordinator consult. Noted that patient's blood sugars have been less than 70 mg/dl.  Was given Lantus 30 units (home dose) last night. Recommend decreasing Lantus dosage 20% to Lantus 24 units every HS and continuing Novolog 0-9 units TID correction scale.  Patient may not need as much insulin as taking at home in the hospital. Titrate dosage as needed.    Harvel Ricks RN BSN CDE Diabetes Coordinator Pager: [phone]  8am-5pm

## 2018-07-10 NOTE — Progress Notes (Addendum)
[facility] [place]  Subjective  - Doing well.  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     Objective (!) 99/43 75 98.3 F (36.8 C) (Oral) 14 95%  Intake/Output Summary (Last 24 hours) at 07/10/2018 0720 Last data filed at 07/10/2018 2111 Gross per 24 hour  Intake 3353.05 ml  Output 1115 ml  Net 2238.05 ml    Palpable DP, doppler brisk PT/peroneal Right groin soft, incisional vac in place, LE incision all healinge well.   Heart RRR Lungs non labored breathing  Assessment/Planning: POD # 1 Right femoral to below-knee popliteal artery bypass graft with ipsilateral non-reversed translocated vein.  Redo right femoral artery exposure.  Patent by pass with palpable DP pulse right LE. PT for mobility Maintain wound vac Low BP 99/43, HBG 7.9 currently asymptomatic.  Will observe for now.  Surgical blood loss 350 ml total.   Will draw CBC and Bmet tomorrow  DM A1C 9.9 was hypoglycemic, now hyperglycemic 313 on sliding scale. Saline lock fluids.  Roxy Horseman 07/10/2018 7:20 AM --  Laboratory Lab Results: Recent Labs    07/09/18 1504 07/10/18 0337  WBC 11.8* 8.8  HGB 10.2* 7.9*  HCT 30.8* 24.1*  PLT 281 215   BMET Recent Labs    07/09/18 1504 07/10/18 0337  NA  --  133*  K  --  4.0  CL  --  103  CO2  --  23  GLUCOSE  --  313*  BUN  --  11  CREATININE 0.91 1.33*  CALCIUM  --  7.0*    COAG Lab Results  Component Value Date   INR 0.96 07/04/2018   INR 0.98 02/28/2012   INR 1.16 12/20/2010   No results found for: PTT   Brisk DP signal.  Incisions ok Needs to walk today Wound care for medial ankle ulcer  Wells Akshitha Culmer

## 2018-07-10 NOTE — Care Management Note (Signed)
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 PT/OT evals pending - CM will follow for transition of care needs  Expected Discharge Date:                  Expected Discharge Plan:  [place]  In-House Referral:     Discharge planning Services  CM Consult  Post Acute Care Choice:  Home Health Choice offered to:     DME Arranged:    DME Agency:     HH Arranged:    HH Agency:  [facility]  Status of Service:  In process, will continue to follow  If discussed at Long Length of Stay Meetings, dates discussed:    Discharge Disposition:   Additional Comments:  Dawayne Patricia, RN 07/10/2018, 2:45 PM

## 2018-07-10 NOTE — Evaluation (Signed)
Occupational Therapy Evaluation Patient Details Name: [name] MRN: [id number] DOB: 12/01/1940 Today's Date: 07/10/2018    History of Present Illness Pt is a 77 y.o. female admitted 07/09/18 for re-do of R femoral artery to below knee popliteal bypass graft. PMH includes PVD, DM, HTN, depression, COPD, AAA (2013).   Clinical Impression   Pt was independent prior to admission and caring for her husband who has dementia at home. She presents with impaired cognition, R LE pain and inability to stand despite +2 assistance. Pt needs min to total assist for ADL. At the time of assessment, pt with blood sugar of 68. Based on today's performance, recommending SNF for further rehab. Daughter observed session and aware of pt's current level of dependence. Will follow acutely.    Follow Up Recommendations  SNF;Supervision/Assistance - 24 hour    Equipment Recommendations       Recommendations for Other Services       Precautions / Restrictions Precautions Precautions: Fall Precaution Comments: wound vac Restrictions Weight Bearing Restrictions: No      Mobility Bed Mobility Overal bed mobility: Needs Assistance Bed Mobility: Supine to Sit;Sit to Supine     Supine to sit: Mod assist Sit to supine: Mod assist   General bed mobility comments: cues for technique, increased time, assist for LEs  Transfers Overall transfer level: Needs assistance Equipment used: Rolling walker (2 wheeled);2 person hand held assist Transfers: Sit to/from Stand Sit to Stand: +2 safety/equipment;Max assist         General transfer comment: pt only able to perform partial stand, attempted x 2 with and without walker, decreased tolerance of weight on R foot    Balance Overall balance assessment: Needs assistance   Sitting balance-Leahy Scale: Fair       Standing balance-Leahy Scale: Zero Standing balance comment: requires max assist of 2, flexed posture                            ADL either performed or assessed with clinical judgement   ADL Overall ADL's : Needs assistance/impaired Eating/Feeding: Set up;Bed level   Grooming: Min guard;Sitting;Moderate assistance   Upper Body Bathing: Moderate assistance;Sitting   Lower Body Bathing: Maximal assistance;Sit to/from stand;+2 for physical assistance   Upper Body Dressing : Moderate assistance;Sitting   Lower Body Dressing: Total assistance;Sitting/lateral leans     Toilet Transfer Details (indicate cue type and reason): unable to transfer/stand         Functional mobility during ADLs: (unable to ambulate)       Vision Patient Visual Report: No change from baseline       Perception     Praxis      Pertinent Vitals/Pain Pain Assessment: Faces Faces Pain Scale: Hurts even more Pain Location: R LE Pain Descriptors / Indicators: Sore;Grimacing;Guarding Pain Intervention(s): Monitored during session;Repositioned;Premedicated before session     Hand Dominance Right   Extremity/Trunk Assessment Upper Extremity Assessment Upper Extremity Assessment: Generalized weakness   Lower Extremity Assessment Lower Extremity Assessment: Defer to PT evaluation       Communication Communication Communication: HOH   Cognition Arousal/Alertness: Awake/alert Behavior During Therapy: WFL for tasks assessed/performed Overall Cognitive Status: Impaired/Different from baseline Area of Impairment: Orientation;Memory;Following commands;Safety/judgement;Problem solving;Attention                 Orientation Level: Disoriented to;Place;Situation;Time Current Attention Level: Selective Memory: Decreased short-term memory Following Commands: Follows one step commands with increased time(and repetition) Safety/Judgement:  Decreased awareness of safety;Decreased awareness of deficits   Problem Solving: Slow processing;Decreased initiation;Difficulty sequencing;Requires verbal cues;Requires tactile cues      General Comments       Exercises     Shoulder Instructions      Home Living Family/patient expects to be discharged to:: Private residence Living Arrangements: Spouse/significant other(husband has dementia, pt is his caregiver) Available Help at Discharge: Family;Available PRN/intermittently(children work) Type of Home: House Home Access: Stairs to enter Technical brewer of Steps: 2   Home Layout: One level     Bathroom Shower/Tub: Teacher, early years/pre: Standard(with 3 in 1 over)     Home Equipment: Environmental consultant - 2 wheels;Cane - single point;Bedside commode          Prior Functioning/Environment Level of Independence: Independent        Comments: pt cares for her husband with dementia        OT Problem List: Decreased strength;Decreased activity tolerance;Pain;Decreased knowledge of use of DME or AE;Decreased cognition;Decreased safety awareness;Impaired balance (sitting and/or standing)      OT Treatment/Interventions: Self-care/ADL training;DME and/or AE instruction;Cognitive remediation/compensation;Patient/family education;Balance training;Therapeutic activities    OT Goals(Current goals can be found in the care plan section) Acute Rehab OT Goals Patient Stated Goal: unable to state OT Goal Formulation: With family Time For Goal Achievement: 07/24/18 Potential to Achieve Goals: Good ADL Goals Pt Will Perform Grooming: with supervision;sitting Pt Will Perform Upper Body Dressing: with supervision;sitting Pt Will Perform Lower Body Dressing: with min assist;sit to/from stand Pt Will Transfer to Toilet: with min assist;stand pivot transfer;bedside commode Pt Will Perform Toileting - Clothing Manipulation and hygiene: with min assist;sit to/from stand Additional ADL Goal #1: Pt will use environmental clues to answer orientation questions accurately.  OT Frequency: Min 2X/week   Barriers to D/C: Decreased caregiver support           Co-evaluation PT/OT/SLP Co-Evaluation/Treatment: Yes Reason for Co-Treatment: For patient/therapist safety   OT goals addressed during session: ADL's and self-care      AM-PAC PT "6 Clicks" Daily Activity     Outcome Measure Help from another person eating meals?: A Little Help from another person taking care of personal grooming?: A Lot Help from another person toileting, which includes using toliet, bedpan, or urinal?: Total Help from another person bathing (including washing, rinsing, drying)?: A Lot Help from another person to put on and taking off regular upper body clothing?: A Lot Help from another person to put on and taking off regular lower body clothing?: Total 6 Click Score: 11   End of Session Equipment Utilized During Treatment: Gait belt;Rolling walker  Activity Tolerance: Patient limited by pain(confusion) Patient left: in bed;with call bell/phone within reach;with bed alarm set;with family/visitor present  OT Visit Diagnosis: Unsteadiness on feet (R26.81);Other abnormalities of gait and mobility (R26.89);Pain;Other symptoms and signs involving cognitive function;Muscle weakness (generalized) (M62.81);History of falling (Z91.81)                Time: 8115-7262 OT Time Calculation (min): 29 min Charges:  OT General Charges $OT Visit: 1 Visit OT Evaluation $OT Eval Moderate Complexity: 1 Mod  Nestor Lewandowsky, OTR/L Acute Rehabilitation Services Pager: [phone] Office: [phone]  Malka So 07/10/2018, 4:12 PM

## 2018-07-10 NOTE — Progress Notes (Signed)
Patient became confused at 1125, was shaky and unable to hold items. She thought year was 69 or 79, did know she was at [facility] and why she was here but had made many other statements indicating confusion (her husband was here when in fact he has not been and is homebound w ALZ). CBG was initially 60, gave OJ, 1/2 tuna sandwwich and CBG up to 72. Gave another container of OJ and some cinnamon teddy grahams and sugar remained at 72. Daughter at bedisde who states when blood glucose is low, she acts "drunk" and similar to this. Notified Maureen who recommended DM shake for increased protein intake as well as ordering DM Coord referral.

## 2018-07-10 NOTE — NC FL2 (Signed)
MEDICAID FL2 LEVEL OF CARE SCREENING TOOL     IDENTIFICATION  Patient Name: [name] Birthdate: 10-26-1940 Sex: female Admission Date (Current Location): 07/09/2018  [facility] and [place] Number:  Herbalist and Address:  [facility] [place]. [facility], [place] [address] [place], [place] [address]      Provider Number: [id number]  Attending Physician Name and Address:  Serafina Mitchell, MD  Relative Name and Phone Number:  Genevra, Orne [phone]    Current Level of Care: Hospital Recommended Level of Care: [place] Prior Approval Number:    Date Approved/Denied:   PASRR Number: [id number]  Discharge Plan: SNF    Current Diagnoses: Patient Active Problem List   Diagnosis Date Noted  . PAD (peripheral artery disease) ([place]) 07/09/2018  . Essential hypertension 06/26/2018  . Hyperlipidemia 06/26/2018  . 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 Claudication of both lower extremities ([place]) 05/28/2017  . Diabetes mellitus with peripheral vascular disease ([place]) 05/28/2017  . Type 2 diabetes mellitus with complication, with long-term current use of insulin ([place]) 06/15/2014  . AAA (abdominal aortic aneurysm) ([place]) 02/26/2012  . PVD (peripheral vascular disease) with claudication ([place]) 02/26/2012  . Iliac artery occlusion ([facility]) 02/26/2012    Orientation RESPIRATION BLADDER Height & Weight     Self, Time, Situation, Place  Normal Continent Weight: 100 lb 2 oz (45.4 kg) Height:  5' 1.5" (156.2 cm)  BEHAVIORAL SYMPTOMS/MOOD NEUROLOGICAL BOWEL NUTRITION STATUS      Continent Diet(Carb modified)  AMBULATORY STATUS COMMUNICATION OF NEEDS Skin   Limited Assist Verbally Normal                       Personal Care Assistance Level of Assistance  Bathing, Feeding, Dressing Bathing Assistance: Limited assistance Feeding assistance: Independent Dressing  Assistance: Limited assistance     Functional Limitations Info  Sight, Hearing, Speech Sight Info: Adequate Hearing Info: Adequate Speech Info: Adequate    SPECIAL CARE FACTORS FREQUENCY  PT (By licensed PT), OT (By licensed OT)     PT Frequency: 5x wk OT Frequency: 5x wk            Contractures Contractures Info: Not present    Additional Factors Info  Code Status, Allergies Code Status Info: Full Code Allergies Info: PENICILLINS           Current Medications (07/10/2018):  This is the current hospital active medication list Current Facility-Administered Medications  Medication Dose Route Frequency Provider Last Rate Last Dose  . 0.9 %  sodium chloride infusion  500 mL Intravenous Once PRN Ulyses Amor, PA-C      . alum & mag hydroxide-simeth (MAALOX/MYLANTA) 200-200-20 MG/5ML suspension 15-30 mL  15-30 mL Oral Q2H PRN Laurence Slate M, PA-C      . amLODipine (NORVASC) tablet 5 mg  5 mg Oral QHS Serafina Mitchell, MD   5 mg at 07/09/18 2135  . anastrozole (ARIMIDEX) tablet 1 mg  1 mg Oral Daily Laurence Slate M, PA-C   1 mg at 07/10/18 1120  . atenolol (TENORMIN) tablet 100 mg  100 mg Oral Daily Laurence Slate M, PA-C      . atorvastatin (LIPITOR) tablet 40 mg  40 mg Oral QHS Laurence Slate M, PA-C   40 mg at 07/09/18 2135  . benazepril (LOTENSIN) tablet 40 mg  40 mg Oral QHS Serafina Mitchell, MD   40  mg at 07/09/18 2135  . bisacodyl (DULCOLAX) EC tablet 5 mg  5 mg Oral Daily PRN Laurence Slate M, PA-C      . cilostazol (PLETAL) tablet 100 mg  100 mg Oral BID AC Collins, Emma M, PA-C   100 mg at 07/10/18 0825  . dextrose 5 % solution   Intravenous Continuous Ulyses Amor, PA-C 50 mL/hr at 07/10/18 1529    . docusate sodium (COLACE) capsule 100 mg  100 mg Oral Daily Laurence Slate M, PA-C   100 mg at 07/10/18 1119  . feeding supplement (GLUCERNA SHAKE) (GLUCERNA SHAKE) liquid 237 mL  237 mL Oral Once Laurence Slate M, PA-C      . fenofibrate tablet 160 mg  160 mg Oral Daily  Laurence Slate M, PA-C   160 mg at 07/10/18 1120  . gabapentin (NEURONTIN) capsule 100 mg  100 mg Oral BID Laurence Slate M, PA-C   100 mg at 07/10/18 1122  . guaiFENesin-dextromethorphan (ROBITUSSIN DM) 100-10 MG/5ML syrup 15 mL  15 mL Oral Q4H PRN Laurence Slate M, PA-C      . heparin injection 5,000 Units  5,000 Units Subcutaneous Q8H Laurence Slate M, PA-C   5,000 Units at 07/10/18 1533  . hydrALAZINE (APRESOLINE) injection 5 mg  5 mg Intravenous Q20 Min PRN Laurence Slate M, PA-C      . HYDROmorphone (DILAUDID) injection 0.5-1 mg  0.5-1 mg Intravenous Q2H PRN Laurence Slate M, PA-C   1 mg at 07/10/18 0249  . insulin aspart (novoLOG) injection 0-9 Units  0-9 Units Subcutaneous TID WC Ulyses Amor, PA-C   5 Units at 07/10/18 [phone]  . insulin glargine (LANTUS) injection 30 Units  30 Units Subcutaneous Q2200 Ulyses Amor, [place]   30 Units at 07/09/18 2136  . labetalol (NORMODYNE,TRANDATE) injection 10 mg  10 mg Intravenous Q10 min PRN Laurence Slate M, PA-C      . levothyroxine (SYNTHROID, LEVOTHROID) tablet 112 mcg  112 mcg Oral QAC breakfast Ulyses Amor, [place]   112 mcg at 07/10/18 9604  . losartan (COZAAR) tablet 100 mg  100 mg Oral Q breakfast Laurence Slate M, PA-C   100 mg at 07/10/18 0825  . magnesium sulfate IVPB 2 g 50 mL  2 g Intravenous Daily PRN Laurence Slate M, PA-C      . metoprolol tartrate (LOPRESSOR) injection 2-5 mg  2-5 mg Intravenous Q2H PRN Laurence Slate M, PA-C      . ondansetron [facility]) injection 4 mg  4 mg Intravenous Q6H PRN Laurence Slate M, PA-C      . oxyCODONE (Oxy IR/ROXICODONE) immediate release tablet 5-10 mg  5-10 mg Oral Q4H PRN Laurence Slate M, PA-C   10 mg at 07/10/18 1117  . oxyCODONE-acetaminophen (PERCOCET/ROXICET) 5-325 MG per tablet 1 tablet  1 tablet Oral Q6H PRN Ulyses Amor, PA-C   1 tablet at 07/10/18 [phone]  . pantoprazole (PROTONIX) EC tablet 40 mg  40 mg Oral Daily Laurence Slate M, PA-C   40 mg at 07/10/18 1120  . phenol (CHLORASEPTIC) mouth spray 1  spray  1 spray Mouth/Throat PRN Laurence Slate M, PA-C      . potassium chloride SA (K-DUR,KLOR-CON) CR tablet 20-40 mEq  20-40 mEq Oral Daily PRN Laurence Slate M, PA-C      . senna-docusate (Senokot-S) tablet 1 tablet  1 tablet Oral QHS PRN Ulyses Amor, PA-C         Discharge Medications: Please see discharge summary for a  list of discharge medications.  Relevant Imaging Results:  Relevant Lab Results:   Additional Information SS# [id number]  Wende Neighbors, LCSW

## 2018-07-10 NOTE — Progress Notes (Signed)
VASCULAR LAB PRELIMINARY  ARTERIAL  ABI completed:    RIGHT    LEFT    PRESSURE WAVEFORM  PRESSURE WAVEFORM  BRACHIAL Unable to obtain due to restricted limb Triphasic BRACHIAL 98 Triphasic  DP 66 Monophasic DP 21 Monophasic  AT   AT    PT 39 Monophasic PT 46 Monophasic  PER   PER    GREAT TOE 15 NA GREAT TOE  Absent    RIGHT LEFT  ABI 0.67 0.47  TBI 0.15 0.00    The right ABI is suggestive of moderate arterial insufficiency at rest. The left ABI is suggestive of severe arterial insufficiency at rest. The right TBI is abnormal at rest. 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  Preliminary results discussed with Dr. Trula Slade.  07/10/2018 10:24 AM Maudry Mayhew, MHA, RVT, RDCS, RDMS
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
## 2018-07-10 NOTE — Progress Notes (Addendum)
Spoke with patient about her diabetes.  Patient could not tell me what dosage of insulin that she is on at home. Patient seemed very confused and trying to lean out of the bed.  The staff RN came in the room.  Daughter also arrived and said that her mother was not acting normally.  She did seem confused. Daughter states that patient has had diabetes for about 4-5 years. Has seen her PCP recently in the last 3 weeks. Since this am, the CBGs have been 60 - 72-72-68 mg/dl. Daughter states that patient does not eat well at home. She has not eaten well here at the hospital.  Blood sugars are both high and low at home.   Recommend decreasing Lantus to 15 units every HS since blood sugars have been low.  Titrate dosage as needed. Suggested to staff RN to check blood sugars every hour for the next 4 hours.  May need to add IV D5W at a slow rate since patient is not eating well and that blood sugars continue to be low. Will continue to monitor blood sugars while in the hospital.  Concern for her confusion that may be due to low blood sugars or anesthesia will hopefully resolve.   Harvel Ricks RN BSN CDE Diabetes Coordinator Pager: [phone]  8am-5pm

## 2018-07-10 NOTE — Evaluation (Signed)
Physical Therapy Evaluation Patient Details Name: [name] MRN: [id number] DOB: 1941/03/20 Today's Date: 07/10/2018   History of Present Illness  Pt is a 77 y.o. female admitted 07/09/18 for re-do of R femoral artery to below knee popliteal bypass graft. PMH includes PVD, DM, HTN, depression, COPD, AAA (2013).    Clinical Impression  Pt presents with an overall decrease in functional mobility secondary to above. 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 Today, pt required maxA+2 to stand and attempt taking steps with RW; increased confusion this session which daughter reports is not baseline cognition. Pt would benefit from continued acute PT services to maximize functional mobility and independence prior to d/c with SNF-level therapies.     Follow Up Recommendations SNF;Supervision/Assistance - 24 hour    Equipment Recommendations  (TBD next venue)    Recommendations for Other Services       Precautions / Restrictions Precautions Precautions: Fall Precaution Comments: wound vac Restrictions Weight Bearing Restrictions: No      Mobility  Bed Mobility Overal bed mobility: Needs Assistance Bed Mobility: Supine to Sit;Sit to Supine     Supine to sit: Mod assist Sit to supine: Mod assist   General bed mobility comments: cues for technique, increased time, assist for LEs  Transfers Overall transfer level: Needs assistance Equipment used: Rolling walker (2 wheeled);2 person hand held assist Transfers: Sit to/from Stand Sit to Stand: Max assist;+2 safety/equipment         General transfer comment: pt only able to perform partial stand, attempted x 2 with and without walker, decreased tolerance of weight on R foot. Heavy posterior lean  Ambulation/Gait Ambulation/Gait assistance: Max assist;+2 safety/equipment Gait Distance (Feet): 1 Feet Assistive device: Rolling walker (2 wheeled) Gait Pattern/deviations: Step-to  pattern;Antalgic;Trunk flexed;Leaning posteriorly     General Gait Details: Attempted sides steps at EOB; pt unable to achieve fully upright posture, required maxA to move RLE  Stairs            Wheelchair Mobility    Modified Rankin (Stroke Patients Only)       Balance Overall balance assessment: Needs assistance   Sitting balance-Leahy Scale: Fair       Standing balance-Leahy Scale: Zero Standing balance comment: requires max assist of 2, flexed posture                             Pertinent Vitals/Pain Pain Assessment: Faces Faces Pain Scale: Hurts even more Pain Location: R LE Pain Descriptors / Indicators: Sore;Grimacing;Guarding Pain Intervention(s): Monitored during session;Premedicated before session    [place] expects to be discharged to:: Private residence Living Arrangements: Spouse/significant other(husband has dementia, pt caregiver) Available Help at Discharge: Family;Available PRN/intermittently(children work) Type of Home: House Home Access: Stairs to enter   [employer] of Steps: 2 Home Layout: One level Home Equipment: Environmental consultant - 2 wheels;Cane - single point;Bedside commode;Other (comment)(Lift chair)      Prior Function Level of Independence: Independent         Comments: pt cares 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 Reports difficulty standing from lower surfaces (has lift chair)     Hand Dominance   Dominant Hand: Right    Extremity/Trunk Assessment   Upper Extremity Assessment Upper Extremity Assessment: Generalized weakness    Lower Extremity Assessment Lower Extremity Assessment: Generalized weakness    Cervical / Trunk Assessment Cervical / Trunk Assessment: Kyphotic  Communication  Communication: HOH  Cognition Arousal/Alertness: Awake/alert Behavior During Therapy: WFL for tasks assessed/performed Overall Cognitive Status: Impaired/Different from baseline Area of Impairment:  Orientation;Memory;Following commands;Safety/judgement;Problem solving;Attention                 Orientation Level: Disoriented to;Place;Situation;Time Current Attention Level: Selective Memory: Decreased short-term memory Following Commands: Follows one step commands with increased time Safety/Judgement: Decreased awareness of safety;Decreased awareness of deficits   Problem Solving: Slow processing;Decreased initiation;Difficulty sequencing;Requires verbal cues;Requires tactile cues        General Comments      Exercises     Assessment/Plan    PT Assessment Patient needs continued PT services  PT Problem List Decreased strength;Decreased activity tolerance;Decreased balance;Decreased mobility;Decreased range of motion;Decreased cognition;Decreased knowledge of use of DME;Decreased safety awareness;Pain       PT Treatment Interventions DME instruction;Gait training;Stair training;Functional mobility training;Therapeutic activities;Therapeutic exercise;Balance training;Patient/family education;Cognitive remediation    PT Goals (Current goals can be found in the Care Plan section)  Acute Rehab PT Goals Patient Stated Goal: unable to state PT Goal Formulation: With patient Time For Goal Achievement: 07/24/18    Frequency Min 2X/week   Barriers to discharge Decreased caregiver support      Co-evaluation PT/OT/SLP Co-Evaluation/Treatment: Yes Reason for Co-Treatment: Necessary to address cognition/behavior during functional activity;For patient/therapist safety PT goals addressed during session: Mobility/safety with mobility;Balance OT goals addressed during session: ADL's and self-care       AM-PAC PT "6 Clicks" Daily Activity  Outcome Measure Difficulty turning over in bed (including adjusting bedclothes, sheets and blankets)?: Unable Difficulty moving from lying on back to sitting on the side of the bed? : Unable Difficulty sitting down on and standing up from  a chair with arms (e.g., wheelchair, bedside commode, etc,.)?: Unable Help needed moving to and from a bed to chair (including a wheelchair)?: A Lot Help needed walking in hospital room?: A Lot Help needed climbing 3-5 steps with a railing? : Total 6 Click Score: 8    End of Session Equipment Utilized During Treatment: Gait belt Activity Tolerance: Patient limited by pain;Patient limited by fatigue Patient left: in bed;with call bell/phone within reach;with bed alarm set;with family/visitor present Nurse Communication: Mobility status PT Visit Diagnosis: Other abnormalities of gait and mobility (R26.89)    Time: 1430-1457 PT Time Calculation (min) (ACUTE ONLY): 27 min   Charges:   PT Evaluation $PT Eval Moderate Complexity: [place], PT, DPT Acute Rehabilitation Services  Pager [phone] Office [phone]  Derry Lory 07/10/2018, 5:10 PM

## 2018-07-10 NOTE — Progress Notes (Signed)
Bladder scan reveals 29ml urine in bladder. Foley removed at 0630 this AM and she has had 170ml intake since that time. She has been urged to increase oral intake, both liquid and food but states she doesn't "usually eat or drink much".

## 2018-07-11 ENCOUNTER — Encounter (HOSPITAL_COMMUNITY): Payer: Self-pay | Admitting: Surgery

## 2018-07-11 LAB — BASIC METABOLIC PANEL
Anion gap: 10 (ref 5–15)
BUN: 18 mg/dL (ref 8–23)
CALCIUM: 7.6 mg/dL — AB (ref 8.9–10.3)
CO2: 22 mmol/L (ref 22–32)
Chloride: 99 mmol/L (ref 98–111)
Creatinine, Ser: 1.88 mg/dL — ABNORMAL HIGH (ref 0.44–1.00)
GFR calc Af Amer: 28 mL/min — ABNORMAL LOW (ref >60)
GFR, EST NON AFRICAN AMERICAN: 24 mL/min — AB (ref >60)
GLUCOSE: 199 mg/dL — AB (ref 70–99)
Potassium: 4.4 mmol/L (ref 3.5–5.1)
Sodium: 131 mmol/L — ABNORMAL LOW (ref 135–145)

## 2018-07-11 LAB — GLUCOSE, CAPILLARY
GLUCOSE-CAPILLARY: 112 mg/dL — AB (ref 70–99)
GLUCOSE-CAPILLARY: 145 mg/dL — AB (ref 70–99)
GLUCOSE-CAPILLARY: 178 mg/dL — AB (ref 70–99)
Glucose-Capillary: 203 mg/dL — ABNORMAL HIGH (ref 70–99)
Glucose-Capillary: 232 mg/dL — ABNORMAL HIGH (ref 70–99)
Glucose-Capillary: 167 mg/dL — ABNORMAL HIGH (ref 70–99)
Glucose-Capillary: 46 mg/dL — ABNORMAL LOW (ref 70–99)
Glucose-Capillary: 52 mg/dL — ABNORMAL LOW (ref 70–99)
Glucose-Capillary: 87 mg/dL (ref 70–99)
Glucose-Capillary: 168 mg/dL — ABNORMAL HIGH (ref 70–99)

## 2018-07-11 LAB — CBC
HCT: 28 % — ABNORMAL LOW (ref 36.0–46.0)
Hemoglobin: 8.8 g/dL — ABNORMAL LOW (ref 12.0–15.0)
MCH: 29.4 pg (ref 26.0–34.0)
MCHC: 31.4 g/dL (ref 30.0–36.0)
MCV: 93.6 fL (ref 78.0–100.0)
PLATELETS: 244 10*3/uL (ref 150–400)
RBC: 2.99 MIL/uL — ABNORMAL LOW (ref 3.87–5.11)
RDW: 13.7 % (ref 11.5–15.5)
WBC: 9.9 10*3/uL (ref 4.0–10.5)

## 2018-07-11 MED ORDER — NYSTATIN 100000 UNIT/ML MT SUSP
5.0000 mL | Freq: Four times a day (QID) | OROMUCOSAL | Status: DC
  Administered 2018-07-11 – 2018-07-16 (×19): 500000 [IU] via ORAL
  Filled 2018-07-11 (×16): qty 5

## 2018-07-11 MED ORDER — SODIUM CHLORIDE 0.9 % IV BOLUS
1000.0000 mL | Freq: Once | INTRAVENOUS | Status: AC
  Administered 2018-07-11: 1000 mL via INTRAVENOUS

## 2018-07-11 NOTE — Progress Notes (Signed)
CBG at 1100 52 then rechecked and verified with reading of 47. Patient oriented to person, place, situation. D5 restarted at 45mL/hr and once Ensure Enlive given and she drank the complete drink. Daughter present in room. Will continue to monitor CBGs and for urine output which has not occurred yet since NS bolus.

## 2018-07-11 NOTE — Plan of Care (Signed)

## 2018-07-11 NOTE — Anesthesia Postprocedure Evaluation (Signed)
Anesthesia Post Note  Patient: [name]  Procedure(s) Performed: RIGHT FEMORAL ARTERY TO BELOW THE KNEE POPLITEAL ARTERY BYPASS GRAFT USING SAPHENOUS VEIN (Right Leg Upper) RIGHT LEG SAPHENOUS VEIN HARVEST (Right Leg Upper) REDO RIGHT COMMON FEMORAL EXPLORATION (Right Groin)     Patient location during evaluation: PACU Anesthesia Type: General Level of consciousness: awake and alert Pain management: pain level controlled Vital Signs Assessment: post-procedure vital signs reviewed and stable Respiratory status: spontaneous breathing, nonlabored ventilation, respiratory function stable and patient connected to nasal cannula oxygen Cardiovascular status: blood pressure returned to baseline and stable Postop Assessment: no apparent nausea or vomiting Anesthetic complications: no    Last Vitals:  Vitals:   07/10/18 2245 07/11/18 0404  BP: 125/88 (!) 148/48  Pulse: 82 79  Resp: 11 14  Temp: 37.6 C 36.9 C  SpO2: 100% 99%    Last Pain:  Vitals:   07/11/18 0404  TempSrc: Oral  PainSc:                  [name]

## 2018-07-11 NOTE — Progress Notes (Signed)
CBG 167. D5 infusion stopped for now. Will continue to monitor.

## 2018-07-11 NOTE — Progress Notes (Addendum)
  Progress Note    07/11/2018 7:44 AM 2 Days Post-Op  Subjective:  Less confused today.  RN reports she was not oriented to time or place yesterday and speaking to people in the room that wasn't there.  Blood sugars are better today with the D5 @ 50cc/hr.  Pt says she is unable to move her right leg but this has been going on since June and is not new.    Tm 99.7 now afebrile HR 70's-80's NSR 888'B-169'I systolic 503% RA  Vitals:   07/10/18 2245 07/11/18 0404  BP: 125/88 (!) 148/48  Pulse: 82 79  Resp: 11 14  Temp: 99.7 F (37.6 C) 98.4 F (36.9 C)  SpO2: 100% 99%    Physical Exam: Cardiac:  regular Lungs:  Non labored Incisions:  Healing nicely Extremities:  +palpable DP pulses bilaterally    CBC    Component Value Date/Time   WBC 9.9 07/11/2018 0406   RBC 2.99 (L) 07/11/2018 0406   HGB 8.8 (L) 07/11/2018 0406   HCT 28.0 (L) 07/11/2018 0406   PLT 244 07/11/2018 0406   MCV 93.6 07/11/2018 0406   MCH 29.4 07/11/2018 0406   MCHC 31.4 07/11/2018 0406   RDW 13.7 07/11/2018 0406    BMET    Component Value Date/Time   NA 131 (L) 07/11/2018 0406   K 4.4 07/11/2018 0406   CL 99 07/11/2018 0406   CO2 22 07/11/2018 0406   GLUCOSE 199 (H) 07/11/2018 0406   BUN 18 07/11/2018 0406   CREATININE 1.88 (H) 07/11/2018 0406   CALCIUM 7.6 (L) 07/11/2018 0406   GFRNONAA 24 (L) 07/11/2018 0406   GFRAA 28 (L) 07/11/2018 0406    INR    Component Value Date/Time   INR 0.96 07/04/2018 0823     Intake/Output Summary (Last 24 hours) at 07/11/2018 0744 Last data filed at 07/11/2018 0700 Gross per 24 hour  Intake 1445.05 ml  Output 500 ml  Net 945.05 ml     Assessment:  77 y.o. female is s/p:  Right femoral to below-knee popliteal artery bypass graft with ipsilateral non-reversed translocated vein.  Redo right femoral artery exposure.  2 Days Post-Op  Plan: -pt has palpable DP pulses bilaterally -pt with increasing creatinine at 1.88 trending up from 1.33 and  0.91-she did not make much urine yesterday after foley removed.  Bladder scan yesterday revealed 40cc.  Re-scanned this am and 500cc and she had I&O cath.    -dc ACEI/ARB -acute blood loss anemia-increased to 8.8 today-she is tolerating (EF 60-65% 9/19) -diabetes:  Glucose improved from yesterday and confusion has improved. -hopefully PT can work with pt again today as she was hypoglycemic and confused yesterday.  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  Her overall foot pain has improved.   Leontine Locket, PA-C [facility] [phone] 07/11/2018 7:44 AM

## 2018-07-11 NOTE — Progress Notes (Signed)
Results for ZAYLAH, BLECHA (MRN [id number]) as of 07/11/2018 12:47  Ref. Range 07/11/2018 04:01 07/11/2018 06:28 07/11/2018 08:47 07/11/2018 11:03 07/11/2018 11:05  Glucose-Capillary Latest Ref Range: 70 - 99 mg/dL 232 (H) 167 (H) 112 (H) 52 (L) 46 (L)  Noted that patient had low blood sugar at noon. No insulin was given last night per [facility].   Recommend discontinuing Lantus. May need to continue Novolog SENSITIVE correction scale TID if patient's blood sugars are consistently above 180 mg/dl. Patient not eating well.  Will continue to monitor blood sugars while in the hospital.   Harvel Ricks RN BSN CDE Diabetes Coordinator Pager: [phone]  8am-5pm

## 2018-07-12 LAB — BASIC METABOLIC PANEL
Anion gap: 9 (ref 5–15)
BUN: 21 mg/dL (ref 8–23)
CHLORIDE: 100 mmol/L (ref 98–111)
CO2: 24 mmol/L (ref 22–32)
CREATININE: 1.42 mg/dL — AB (ref 0.44–1.00)
Calcium: 7.7 mg/dL — ABNORMAL LOW (ref 8.9–10.3)
GFR, EST AFRICAN AMERICAN: 40 mL/min — AB (ref >60)
GFR, EST NON AFRICAN AMERICAN: 34 mL/min — AB (ref >60)
Glucose, Bld: 169 mg/dL — ABNORMAL HIGH (ref 70–99)
Potassium: 4.7 mmol/L (ref 3.5–5.1)
SODIUM: 133 mmol/L — AB (ref 135–145)

## 2018-07-12 LAB — GLUCOSE, CAPILLARY
GLUCOSE-CAPILLARY: 146 mg/dL — AB (ref 70–99)
GLUCOSE-CAPILLARY: 162 mg/dL — AB (ref 70–99)
GLUCOSE-CAPILLARY: 187 mg/dL — AB (ref 70–99)
GLUCOSE-CAPILLARY: 159 mg/dL — AB (ref 70–99)
Glucose-Capillary: 176 mg/dL — ABNORMAL HIGH (ref 70–99)

## 2018-07-12 LAB — CBC
HCT: 23.1 % — ABNORMAL LOW (ref 36.0–46.0)
HEMOGLOBIN: 7.6 g/dL — AB (ref 12.0–15.0)
MCH: 29.3 pg (ref 26.0–34.0)
MCHC: 32.9 g/dL (ref 30.0–36.0)
MCV: 89.2 fL (ref 78.0–100.0)
PLATELETS: 226 10*3/uL (ref 150–400)
RBC: 2.59 MIL/uL — AB (ref 3.87–5.11)
RDW: 13.4 % (ref 11.5–15.5)
WBC: 9.7 10*3/uL (ref 4.0–10.5)

## 2018-07-12 NOTE — Progress Notes (Signed)
Inpatient Diabetes Program Recommendations  AACE/ADA: New Consensus Statement on Inpatient Glycemic Control (2019)  Target Ranges:  Prepandial:   less than 140 mg/dL      Peak postprandial:   less than 180 mg/dL (1-2 hours)      Critically ill patients:  140 - 180 mg/dL   Results for [name], SCHLEGEL (MRN [id number]) as of 07/12/2018 06:40  Ref. Range 07/11/2018 08:47 07/11/2018 11:03 07/11/2018 11:05 07/11/2018 12:26 07/11/2018 14:03 07/11/2018 17:04 07/11/2018 21:21 07/12/2018 00:57 07/12/2018 06:12  Glucose-Capillary Latest Ref Range: 70 - 99 mg/dL 112 (H) 52 (L) 46 (L) 87 145 (H) 168 (H) 178 (H) 176 (H) 146 (H)   Review of Glycemic Control  Diabetes history: DM2 Outpatient Diabetes medications: Lantus 30 units QHS Current orders for Inpatient glycemic control: Lantus 30 units QHS, Novolog 0-9 units TID with meals  Inpatient Diabetes Program Recommendations: Insulin - Basal: NO Lantus has been given in over 48 hours. Please discontinue Lantus while inpatient. Correction (SSI): Please consider ordering Novolog 0-5 units QHS for bedtime correction. IV Fluids: Please re-evaluate need for D5 @ 50 ml/hr if patient is eating.  NOTE: In reviewing the chart noted patient was last given Lantus 30 units on 07/09/18 at 21:36. Patient's glucose was noted to be low on 07/10/18 and on 07/11/18 despite NO further Lantus being given.   Thanks, Barnie Alderman, RN, MSN, CDE Diabetes Coordinator Inpatient Diabetes Program [phone] (Team Pager from 8am to 5pm)

## 2018-07-12 NOTE — Progress Notes (Signed)
Pt up to the bedside commode max 2 assist states she cant move right foot, voided 400 cc of yellow urine

## 2018-07-12 NOTE — Progress Notes (Signed)
CSW contacted son for facility choices for SNF rehab. Son prefers [facility] for placement and wants to wait on final decision. Currently facility is pending review . CSW acknowledged. Pt continue medical workup. Will continue to assist as needed.

## 2018-07-12 NOTE — Progress Notes (Addendum)
    Subjective  - POD #3  Had a good night Right leg still sore Stood up yesterday, but did not walk   Physical Exam:  Brisk right DP doppler Incisions all ok     Assessment/Plan:  POD #3  Needs to get OOB Ulcer improving D/c 2-3- days given confusion , will check UA  Wells Rayhana Slider 07/12/2018 7:47 AM --  Vitals:   07/12/18 0035 07/12/18 0255  BP: (!) 155/56 (!) 134/49  Pulse: 99 87  Resp: (!) 23 12  Temp: 99.9 F (37.7 C) 98.6 F (37 C)  SpO2: 98% 97%    Intake/Output Summary (Last 24 hours) at 07/12/2018 0747 Last data filed at 07/12/2018 0100 Gross per 24 hour  Intake 356 ml  Output 2100 ml  Net -1744 ml     Laboratory CBC    Component Value Date/Time   WBC 9.7 07/12/2018 0255   HGB 7.6 (L) 07/12/2018 0255   HCT 23.1 (L) 07/12/2018 0255   PLT 226 07/12/2018 0255    BMET    Component Value Date/Time   NA 133 (L) 07/12/2018 0255   K 4.7 07/12/2018 0255   CL 100 07/12/2018 0255   CO2 24 07/12/2018 0255   GLUCOSE 169 (H) 07/12/2018 0255   BUN 21 07/12/2018 0255   CREATININE 1.42 (H) 07/12/2018 0255   CALCIUM 7.7 (L) 07/12/2018 0255   GFRNONAA 34 (L) 07/12/2018 0255   GFRAA 40 (L) 07/12/2018 0255    COAG Lab Results  Component Value Date   INR 0.96 07/04/2018   INR 0.98 02/28/2012   INR 1.16 12/20/2010   No results found for: PTT  Antibiotics Anti-infectives (From admission, onward)   Start     Dose/Rate Route Frequency Ordered Stop   07/10/18 0830  vancomycin (VANCOCIN) IVPB 750 mg/150 ml premix     750 mg 150 mL/hr over 60 Minutes Intravenous  Once 07/09/18 1457 07/10/18 0938   07/09/18 0655  vancomycin (VANCOCIN) IVPB 1000 mg/200 mL premix     1,000 mg 200 mL/hr over 60 Minutes Intravenous 60 min pre-op 07/09/18 0655 07/09/18 0928       V. Leia Alf, M.D. [facility] [place] Office: [phone] Pager:  [phone]

## 2018-07-13 LAB — CBC
HCT: 25.6 % — ABNORMAL LOW (ref 36.0–46.0)
Hemoglobin: 8.3 g/dL — ABNORMAL LOW (ref 12.0–15.0)
MCH: 29.2 pg (ref 26.0–34.0)
MCHC: 32.4 g/dL (ref 30.0–36.0)
MCV: 90.1 fL (ref 78.0–100.0)
PLATELETS: 286 10*3/uL (ref 150–400)
RBC: 2.84 MIL/uL — AB (ref 3.87–5.11)
RDW: 13.2 % (ref 11.5–15.5)
WBC: 10.5 10*3/uL (ref 4.0–10.5)

## 2018-07-13 LAB — BASIC METABOLIC PANEL
ANION GAP: 12 (ref 5–15)
BUN: 21 mg/dL (ref 8–23)
CO2: 22 mmol/L (ref 22–32)
Calcium: 8.1 mg/dL — ABNORMAL LOW (ref 8.9–10.3)
Chloride: 103 mmol/L (ref 98–111)
Creatinine, Ser: 1.25 mg/dL — ABNORMAL HIGH (ref 0.44–1.00)
GFR, EST AFRICAN AMERICAN: 46 mL/min — AB (ref >60)
GFR, EST NON AFRICAN AMERICAN: 40 mL/min — AB (ref >60)
Glucose, Bld: 103 mg/dL — ABNORMAL HIGH (ref 70–99)
Potassium: 4.8 mmol/L (ref 3.5–5.1)
SODIUM: 137 mmol/L (ref 135–145)

## 2018-07-13 LAB — GLUCOSE, CAPILLARY
GLUCOSE-CAPILLARY: 82 mg/dL (ref 70–99)
GLUCOSE-CAPILLARY: 146 mg/dL — AB (ref 70–99)
Glucose-Capillary: 82 mg/dL (ref 70–99)
Glucose-Capillary: 82 mg/dL (ref 70–99)
Glucose-Capillary: 63 mg/dL — ABNORMAL LOW (ref 70–99)
Glucose-Capillary: 78 mg/dL (ref 70–99)
Glucose-Capillary: 138 mg/dL — ABNORMAL HIGH (ref 70–99)

## 2018-07-13 MED ORDER — OXYCODONE HCL 5 MG PO TABS
5.0000 mg | ORAL_TABLET | ORAL | Status: DC | PRN
  Administered 2018-07-13: 5 mg via ORAL
  Filled 2018-07-13: qty 1

## 2018-07-13 MED ORDER — MAGIC MOUTHWASH
5.0000 mL | Freq: Three times a day (TID) | ORAL | Status: DC
  Administered 2018-07-13 – 2018-07-16 (×9): 5 mL via ORAL
  Filled 2018-07-13 (×10): qty 5

## 2018-07-13 MED ORDER — HYDROMORPHONE HCL 1 MG/ML IJ SOLN
0.5000 mg | INTRAMUSCULAR | Status: DC | PRN
  Administered 2018-07-14 (×2): 0.5 mg via INTRAVENOUS
  Filled 2018-07-13 (×2): qty 1

## 2018-07-13 NOTE — Progress Notes (Signed)
Pt had complained about severe pain on her right buttock. Upon assessment, the pt was unable to tolerate a touch on her right buttock, pain medication administered. We'll continue to monitor patient.

## 2018-07-13 NOTE — Progress Notes (Signed)
Pt keeps complaining about the right hip/buttock rated at 8-9/10. Pt had an episode of  Mixture of urine and stool . This RN were unable to collect the UA  Pt declines ambulation but were able to be assisted to the bedside commode.

## 2018-07-13 NOTE — Progress Notes (Signed)
Hypoglycemic Event  CBG: 63  Treatment: 8oz Orange Juice  Symptoms: none  Follow-up CBG: Time: 1648 CBG Result: 78  Possible Reasons for Event: pt didn't eat lunch  Comments/MD notified:N/A    [name] A

## 2018-07-13 NOTE — Progress Notes (Signed)
Pt ambulated 14ft in room to sit in chair using walker.   Arnell Sieving, RN, BSN

## 2018-07-13 NOTE — Progress Notes (Addendum)
  Progress Note    07/13/2018 8:32 AM 4 Days Post-Op  Subjective:  Complaining of R fhip and back pain which resolves with sitting in chair   Vitals:   07/13/18 0532 07/13/18 0809  BP: (!) 179/115 (!) 142/65  Pulse: 79 72  Resp: 20 15  Temp: 97.7 F (36.5 C)   SpO2: 100%    Physical Exam: Lungs:  Non labored Incisions:  R groin incisional vac with good seal; R popliteal incision c/d/i; distal vein harvest incision with minimal serous collection on dressing Extremities:  Strong R DP by doppler; R medial ankle ulceration unchanged Abdomen: soft Neurologic: A&O  CBC    Component Value Date/Time   WBC 10.5 07/13/2018 0428   RBC 2.84 (L) 07/13/2018 0428   HGB 8.3 (L) 07/13/2018 0428   HCT 25.6 (L) 07/13/2018 0428   PLT 286 07/13/2018 0428   MCV 90.1 07/13/2018 0428   MCH 29.2 07/13/2018 0428   MCHC 32.4 07/13/2018 0428   RDW 13.2 07/13/2018 0428    BMET    Component Value Date/Time   NA 137 07/13/2018 0428   K 4.8 07/13/2018 0428   CL 103 07/13/2018 0428   CO2 22 07/13/2018 0428   GLUCOSE 103 (H) 07/13/2018 0428   BUN 21 07/13/2018 0428   CREATININE 1.25 (H) 07/13/2018 0428   CALCIUM 8.1 (L) 07/13/2018 0428   GFRNONAA 40 (L) 07/13/2018 0428   GFRAA 46 (L) 07/13/2018 0428    INR    Component Value Date/Time   INR 0.96 07/04/2018 0823     Intake/Output Summary (Last 24 hours) at 07/13/2018 1017 Last data filed at 07/13/2018 5102 Gross per 24 hour  Intake 120 ml  Output 1 ml  Net 119 ml     Assessment/Plan:  77 y.o. female is s/p R fem pop bypass with vein 4 Days Post-Op   Perfusing RLE well with DP by doppler Ulcer unremarkable this morning Encouraged OOB/participation with therapy teams Social work working on [employer] placement Dispo pending SNF placement  Dagoberto Ligas, PA-C [facility] [phone] 07/13/2018 8:32 AM  Needs to get OOB and walk  Wells Kenady Doxtater

## 2018-07-13 NOTE — Progress Notes (Signed)
Inpatient Diabetes Program Recommendations  AACE/ADA: New Consensus Statement on Inpatient Glycemic Control (2015)  Target Ranges:  Prepandial:   less than 140 mg/dL      Peak postprandial:   less than 180 mg/dL (1-2 hours)      Critically ill patients:  140 - 180 mg/dL   Results for [name], [name] (MRN [id number]) as of 07/13/2018 08:56  Ref. Range 07/12/2018 06:12 07/12/2018 11:45 07/12/2018 16:48 07/12/2018 22:27 07/13/2018 05:50 07/13/2018 08:12  Glucose-Capillary Latest Ref Range: 70 - 99 mg/dL 146 (H) 162 (H) 187 (H) 159 (H) 82 82   Review of Glycemic Control Diabetes history: DM2 Outpatient Diabetes medications: Lantus 30 units QHS Current orders for Inpatient glycemic control: Lantus 30 units QHS, Novolog 0-9 units TID with meals  Inpatient Diabetes Program Recommendations: Insulin - Basal: Noted Lantus 30 units given last night. Fasting glucose 82 mg/dl this morning. Concerned about hypoglycemia since patient experienced several episodes of hypoglycemia after getting Lantus 30 units last on 07/09/18. If patient experiences any hypoglycemia over the next 24 hours, please discontinue Lantus for now. Correction (SSI): Please consider ordering Novolog 0-5 units QHS for bedtime correction. IV Fluids: Please re-evaluate need for D5 @ 50 ml/hr if patient is eating.  Thanks, Barnie Alderman, RN, MSN, CDE Diabetes Coordinator Inpatient Diabetes Program [phone] (Team Pager from 8am to 5pm)

## 2018-07-14 ENCOUNTER — Other Ambulatory Visit: Payer: Self-pay

## 2018-07-14 ENCOUNTER — Inpatient Hospital Stay (HOSPITAL_COMMUNITY): Payer: Medicare HMO

## 2018-07-14 LAB — GLUCOSE, CAPILLARY
Glucose-Capillary: 74 mg/dL (ref 70–99)
Glucose-Capillary: 160 mg/dL — ABNORMAL HIGH (ref 70–99)
Glucose-Capillary: 141 mg/dL — ABNORMAL HIGH (ref 70–99)
Glucose-Capillary: 130 mg/dL — ABNORMAL HIGH (ref 70–99)

## 2018-07-14 NOTE — Plan of Care (Signed)

## 2018-07-14 NOTE — Progress Notes (Signed)
Clinical Social Worker following patient for support and discharge needs. Patients family is wanting patient to go to [facility]. CSW contacted facility to confirm a bed for patient and to have facility start auth. Facility stated they will follow up with CSW once authorization has been obtained from facility.   Rhea Pink, MSW,  [place]

## 2018-07-14 NOTE — Progress Notes (Signed)
Inpatient Diabetes Program Recommendations  AACE/ADA: New Consensus Statement on Inpatient Glycemic Control (2015)  Target Ranges:  Prepandial:   less than 140 mg/dL      Peak postprandial:   less than 180 mg/dL (1-2 hours)      Critically ill patients:  140 - 180 mg/dL   Lab Results  Component Value Date   GLUCAP 160 (H) 07/14/2018   HGBA1C 9.9 (H) 07/09/2018    Review of Glycemic Control  Blood sugars still on the low side. FBS - 74 this am.  Recommendations:  Decrease Lantus to 20 units QHS  Continue to follow.  Thank you. Lorenda Peck, RD, LDN, CDE Inpatient Diabetes Coordinator [phone]

## 2018-07-14 NOTE — Progress Notes (Signed)
Physical Therapy Treatment Patient Details Name: [name] MRN: [id number] DOB: July 23, 1941 Today's Date: 07/14/2018    History of Present Illness Pt is a 77 y.o. female admitted 07/09/18 for re-do of R femoral artery to below knee popliteal bypass graft. PMH includes PVD, DM, HTN, depression, COPD, AAA (2013).    PT Comments    Pt with good progress with mobility. Continue to recommend ST-SNF.   Follow Up Recommendations  SNF;Supervision/Assistance - 24 hour     Equipment Recommendations  None recommended by PT    Recommendations for Other Services       Precautions / Restrictions Precautions Precautions: Fall Precaution Comments: wound vac Restrictions Weight Bearing Restrictions: No    Mobility  Bed Mobility Overal bed mobility: Needs Assistance Bed Mobility: Supine to Sit     Supine to sit: Min assist     General bed mobility comments: Assist to elevate trunk into sitting and bring hips to EOB  Transfers Overall transfer level: Needs assistance Equipment used: Rolling walker (2 wheeled);2 person hand held assist Transfers: Sit to/from Stand Sit to Stand: Min assist         General transfer comment: Assist to bring hips up and for balance  Ambulation/Gait Ambulation/Gait assistance: Min assist Gait Distance (Feet): 75 Feet Assistive device: Rolling walker (2 wheeled) Gait Pattern/deviations: Step-through pattern;Decreased step length - right;Decreased step length - left;Trunk flexed;Antalgic;Decreased weight shift to right Gait velocity: decr Gait velocity interpretation: <1.31 ft/sec, indicative of household ambulator General Gait Details: Assist for balance and support. Verbal cues to stand more erect   Stairs             Wheelchair Mobility    Modified Rankin (Stroke Patients Only)       Balance Overall balance assessment: Needs assistance Sitting-balance support: No upper extremity supported;Feet supported Sitting balance-Leahy  Scale: Fair     Standing balance support: Bilateral upper extremity supported Standing balance-Leahy Scale: Poor Standing balance comment: walker and min guard for static standing                            Cognition Arousal/Alertness: Awake/alert Behavior During Therapy: WFL for tasks assessed/performed Overall Cognitive Status: Impaired/Different from baseline Area of Impairment: Problem solving;Memory                     Memory: Decreased short-term memory       Problem Solving: Requires verbal cues        Exercises      General Comments        Pertinent Vitals/Pain Pain Assessment: Faces Faces Pain Scale: Hurts even more Pain Location: rt hip Pain Descriptors / Indicators: Grimacing;Guarding Pain Intervention(s): Limited activity within patient's tolerance;Monitored during session;Repositioned    Home Living                      Prior Function            PT Goals (current goals can now be found in the care plan section) Acute Rehab PT Goals PT Goal Formulation: With patient Time For Goal Achievement: 07/28/18 Progress towards PT goals: Goals met and updated - see care plan    Frequency    Min 2X/week      PT Plan Current plan remains appropriate    Co-evaluation              AM-PAC PT "6 Clicks" Daily Activity  Outcome  Measure  Difficulty turning over in bed (including adjusting bedclothes, sheets and blankets)?: Unable Difficulty moving from lying on back to sitting on the side of the bed? : Unable Difficulty sitting down on and standing up from a chair with arms (e.g., wheelchair, bedside commode, etc,.)?: Unable Help needed moving to and from a bed to chair (including a wheelchair)?: A Little Help needed walking in hospital room?: A Little Help needed climbing 3-5 steps with a railing? : A Lot 6 Click Score: 11    End of Session Equipment Utilized During Treatment: Gait belt Activity Tolerance: Patient  tolerated treatment well Patient left: in chair;with call bell/phone within reach;with chair alarm set Nurse Communication: Mobility status PT Visit Diagnosis: Other abnormalities of gait and mobility (R26.89);Pain Pain - Right/Left: Right Pain - part of body: Hip     Time: 1151-1208 PT Time Calculation (min) (ACUTE ONLY): 17 min  Charges:  $Gait Training: 8-22 mins                     [place] Pager [phone] Office [place] 07/14/2018, 3:15 PM

## 2018-07-14 NOTE — Care Management Important Message (Signed)
Important Message  Patient Details  Name: [name] MRN: [id number] Date of Birth: 11-04-1940   Medicare Important Message Given:  Yes    Inna Tisdell P Iwalani Templeton 07/14/2018, 11:50 AM

## 2018-07-14 NOTE — Progress Notes (Addendum)
  Progress Note    07/14/2018 7:22 AM 5 Days Post-Op  Subjective:  R hip pain this morning.  Patient believes this started since she was admitted.   Vitals:   07/13/18 2349 07/14/18 0600  BP: (!) 132/52 (!) 153/61  Pulse: 75 66  Resp: 19 13  Temp: 98.8 F (37.1 C) 98.3 F (36.8 C)  SpO2: 100% 99%   Physical Exam: Lungs:  Non labored Incisions:  R groin incision with incisional vac; R popliteal incision healing well; R lateral foot and medial ankle ulcers unchanged Extremities:  No palpable deformity RLE; exam limited due to pain Abdomen:  Soft Neurologic: A&O  CBC    Component Value Date/Time   WBC 10.5 07/13/2018 0428   RBC 2.84 (L) 07/13/2018 0428   HGB 8.3 (L) 07/13/2018 0428   HCT 25.6 (L) 07/13/2018 0428   PLT 286 07/13/2018 0428   MCV 90.1 07/13/2018 0428   MCH 29.2 07/13/2018 0428   MCHC 32.4 07/13/2018 0428   RDW 13.2 07/13/2018 0428    BMET    Component Value Date/Time   NA 137 07/13/2018 0428   K 4.8 07/13/2018 0428   CL 103 07/13/2018 0428   CO2 22 07/13/2018 0428   GLUCOSE 103 (H) 07/13/2018 0428   BUN 21 07/13/2018 0428   CREATININE 1.25 (H) 07/13/2018 0428   CALCIUM 8.1 (L) 07/13/2018 0428   GFRNONAA 40 (L) 07/13/2018 0428   GFRAA 46 (L) 07/13/2018 0428    INR    Component Value Date/Time   INR 0.96 07/04/2018 0823     Intake/Output Summary (Last 24 hours) at 07/14/2018 2449 Last data filed at 07/14/2018 0356 Gross per 24 hour  Intake -  Output 1150 ml  Net -1150 ml     Assessment/Plan:  77 y.o. female is s/p R fem pop bypass with vein 5 Days Post-Op   Perfusing RLE well with multiphasic R DP by doppler Ulcerations R foot stable Continue incisional vac for now R groin R hip pain; no fall or other trauma; likely positional; will discuss management with attending  Dispo: SW on board for possible SNF placement   Dagoberto Ligas, PA-C [facility] [phone] 07/14/2018 7:22 AM  C/o right hip and leg  pain Good pedal doppler signals on right Incisions clean, vac to right groin.  Unclear etiology of hip pain, supect musculoskeltal Will check xrays, and get her up with  [name]

## 2018-07-15 LAB — GLUCOSE, CAPILLARY
GLUCOSE-CAPILLARY: 115 mg/dL — AB (ref 70–99)
GLUCOSE-CAPILLARY: 62 mg/dL — AB (ref 70–99)
GLUCOSE-CAPILLARY: 167 mg/dL — AB (ref 70–99)
Glucose-Capillary: 55 mg/dL — ABNORMAL LOW (ref 70–99)
Glucose-Capillary: 81 mg/dL (ref 70–99)
Glucose-Capillary: 92 mg/dL (ref 70–99)

## 2018-07-15 NOTE — Plan of Care (Signed)
  Problem: Education: Goal: Knowledge of General Education information will improve Description Including pain rating scale, medication(s)/side effects and non-pharmacologic comfort measures Outcome: Progressing   

## 2018-07-15 NOTE — Progress Notes (Signed)
Physical Therapy Treatment Patient Details Name: [name] MRN: [id number] DOB: 03/08/1941 Today's Date: 07/15/2018    History of Present Illness Pt is a 77 y.o. female admitted 07/09/18 for re-do of R femoral artery to below knee popliteal bypass graft. PMH includes PVD, DM, HTN, depression, COPD, AAA (2013).    PT Comments    Pt making excellent progress with mobility. Pt wants to return home with intermittent family support which should be sufficient for patient at this time. Agree with plan for pt to return home with HHPT.    Follow Up Recommendations  Home health PT;Supervision - Intermittent     Equipment Recommendations  None recommended by PT    Recommendations for Other Services       Precautions / Restrictions Precautions Precautions: Fall Precaution Comments: wound vac Restrictions Weight Bearing Restrictions: No    Mobility  Bed Mobility Overal bed mobility: Needs Assistance Bed Mobility: Supine to Sit     Supine to sit: Supervision        Transfers Overall transfer level: Needs assistance Equipment used: Rolling walker (2 wheeled) Transfers: Sit to/from Stand Sit to Stand: Supervision         General transfer comment: supervision for safety  Ambulation/Gait Ambulation/Gait assistance: Supervision Gait Distance (Feet): 225 Feet Assistive device: 4-wheeled walker;Rolling walker (2 wheeled) Gait Pattern/deviations: Step-through pattern;Trunk flexed;Antalgic;Decreased weight shift to right;Decreased stride length Gait velocity: decr Gait velocity interpretation: 1.31 - 2.62 ft/sec, indicative of limited community ambulator General Gait Details: supervision for safety   Stairs             Wheelchair Mobility    Modified Rankin (Stroke Patients Only)       Balance Overall balance assessment: Needs assistance Sitting-balance support: No upper extremity supported;Feet supported Sitting balance-Leahy Scale: Fair     Standing  balance support: Bilateral upper extremity supported Standing balance-Leahy Scale: Poor Standing balance comment: walker and supervision                            Cognition Arousal/Alertness: Awake/alert Behavior During Therapy: WFL for tasks assessed/performed Overall Cognitive Status: Within Functional Limits for tasks assessed                                        Exercises      General Comments        Pertinent Vitals/Pain Pain Assessment: Faces Faces Pain Scale: Hurts little more Pain Location: rt hip Pain Descriptors / Indicators: Grimacing;Guarding Pain Intervention(s): Limited activity within patient's tolerance    Home Living                      Prior Function            PT Goals (current goals can now be found in the care plan section) Acute Rehab PT Goals PT Goal Formulation: With patient Time For Goal Achievement: 07/28/18 Progress towards PT goals: Goals met and updated - see care plan    Frequency    Min 3X/week      PT Plan Discharge plan needs to be updated;Frequency needs to be updated    Co-evaluation              AM-PAC PT "6 Clicks" Daily Activity  Outcome Measure  Difficulty turning over in bed (including adjusting bedclothes, sheets and blankets)?: A  Little Difficulty moving from lying on back to sitting on the side of the bed? : A Little Difficulty sitting down on and standing up from a chair with arms (e.g., wheelchair, bedside commode, etc,.)?: A Little Help needed moving to and from a bed to chair (including a wheelchair)?: A Little Help needed walking in hospital room?: A Little Help needed climbing 3-5 steps with a railing? : A Little 6 Click Score: 18    End of Session Equipment Utilized During Treatment: Gait belt Activity Tolerance: Patient tolerated treatment well Patient left: in chair;with call bell/phone within reach;with chair alarm set Nurse Communication: Mobility  status PT Visit Diagnosis: Other abnormalities of gait and mobility (R26.89);Pain Pain - Right/Left: Right Pain - part of body: Hip     Time: 9977-4142 PT Time Calculation (min) (ACUTE ONLY): 17 min  Charges:  $Gait Training: 8-22 mins                     [place] Pager [phone] Office [place] 07/15/2018, 3:59 PM

## 2018-07-15 NOTE — Progress Notes (Addendum)
  Progress Note    07/15/2018 7:53 AM 6 Days Post-Op  Subjective:  C/o right hip pain  Afebrile HR 50's-60's NSR 962'E-366'Q systolic 94% RA  Vitals:   07/15/18 0000 07/15/18 0340  BP: (!) 127/53 130/60  Pulse: 60 (!) 57  Resp: 11 17  Temp: 98.7 F (37.1 C) 98.4 F (36.9 C)  SpO2: 100% 100%    Physical Exam: Cardiac:  regular Lungs:  Non labored Incisions:  Clean and dry and healing nicely; pravena with good seal Extremities:  Easily palpable right DP pulse   CBC    Component Value Date/Time   WBC 10.5 07/13/2018 0428   RBC 2.84 (L) 07/13/2018 0428   HGB 8.3 (L) 07/13/2018 0428   HCT 25.6 (L) 07/13/2018 0428   PLT 286 07/13/2018 0428   MCV 90.1 07/13/2018 0428   MCH 29.2 07/13/2018 0428   MCHC 32.4 07/13/2018 0428   RDW 13.2 07/13/2018 0428    BMET    Component Value Date/Time   NA 137 07/13/2018 0428   K 4.8 07/13/2018 0428   CL 103 07/13/2018 0428   CO2 22 07/13/2018 0428   GLUCOSE 103 (H) 07/13/2018 0428   BUN 21 07/13/2018 0428   CREATININE 1.25 (H) 07/13/2018 0428   CALCIUM 8.1 (L) 07/13/2018 0428   GFRNONAA 40 (L) 07/13/2018 0428   GFRAA 46 (L) 07/13/2018 0428    INR    Component Value Date/Time   INR 0.96 07/04/2018 0823     Intake/Output Summary (Last 24 hours) at 07/15/2018 0753 Last data filed at 07/15/2018 0701 Gross per 24 hour  Intake 150 ml  Output 2225 ml  Net -2075 ml     Assessment:  77 y.o. female is s/p:  R fem pop bypass with vein 5 Days  6 Days Post-Op  Plan: -pt with palpable right DP pulse -pt with right hip pain - says she has been having right hip pain well before admission.  Xray reportedly ordered but I don't see the result.  I will d/w Dr. Trula Slade. -discussed with pt that she will need intermediate rehab before going home -DVT prophylaxis:  Sq heparin   Leontine Locket, PA-C [facility] [phone] 07/15/2018 7:53 AM  Hip xray, unremarkable.  Spoke with PT to re-evaluate patient  for home discharge.  Patient does not want to [facility] SNF.  [name]

## 2018-07-15 NOTE — Progress Notes (Signed)
Hypoglycemic Event  CBG: 55  Treatment: 15 GM carbohydrate snack  Symptoms: None  Follow-up CBG: Time:0626 CBG Result:81  Possible Reasons for Event: Medication regimen: meds  Comments/MD notified:pending    Gordy Levan

## 2018-07-15 NOTE — Consult Note (Signed)
[facility] CM Primary Care Navigator  07/15/2018  [name] 10/04/1941 [id number]   Met with patientat the bedside to identify possible discharge needs. Patient presented with new painful ulcer on her right medial malleolus that has been present since after her visit with Dr. Trula Slade in June, which is not getting better that had led to this admission/ surgery. (status post re-do right femoral popliteal bypass graft)  Patient endorses Dr.Douglas Delena Bali with [facility] as herprimary care provider.   Patient shared using [facility] pharmacy in [place] to obtain medications without any problem.  Patientverbalized managing herownmedications at [employer] use of "pill box" system filled once a week.  Patientreports that her son Campbell Lerner) has beendriving and providing transportation to her doctors' appointments.  [place] husband (has Alzheimer's) and states that her son (works) but will serve as Engineer, site at [place].  Disposition for discharge isstillto be determined. Per MD note, discussed with patient that she will need intermediate rehab before going home but patient plans and hopes to go home.   Patient voiced understanding to call primary care provider's office whenshe returns back home, for a post discharge follow-up appointment within 1- 2 weeks or sooner if needs arise.Patient letter (with PCP's contact number) was provided as a reminder.  Explained to patient about [facility] CM services available for health management and resources at home and she indicated interest for it. Encouraged patient to talk to primary care provider on her next visit about further needs and assistance regarding managing her health conditions (mainly DM) at home. Inpatient DM coordinator has been seeing patient on this admission. Patient had voiced understandingto seekreferral to [facility] care managementfrom primary care  providerif deemed necessary andappropriate foranyservices in thenear future- once she gets back home.  [facility] care management information provided for future needs that she may have.   Primary care provider's office is listed as providing transition of care (TOC) follow-up.    For additional questions please contact:  Edwena Felty A. Ruie Sendejo, BSN, RN-BC [facility] PRIMARY CARE Navigator Cell: [phone]

## 2018-07-16 ENCOUNTER — Telehealth: Payer: Self-pay | Admitting: Surgery

## 2018-07-16 LAB — GLUCOSE, CAPILLARY
GLUCOSE-CAPILLARY: 117 mg/dL — AB (ref 70–99)
GLUCOSE-CAPILLARY: 77 mg/dL (ref 70–99)

## 2018-07-16 MED ORDER — OXYCODONE-ACETAMINOPHEN 5-325 MG PO TABS: 1.0000 | ORAL_TABLET | Freq: Four times a day (QID) | ORAL | 0 refills | Status: DC | PRN

## 2018-07-16 MED ORDER — INSULIN GLARGINE 100 UNIT/ML SOLOSTAR PEN: 20.0000 [IU] | PEN_INJECTOR | Freq: Every day | SUBCUTANEOUS | Status: DC

## 2018-07-16 NOTE — Telephone Encounter (Signed)
sch appt spk to pt 11/11/17 145pm p/o MD

## 2018-07-16 NOTE — Discharge Summary (Addendum)
Discharge Summary     IRETTA MANGRUM Feb 19, 1941 77 y.o. female  [id number]  Admission Date: 07/09/2018  Discharge Date: 07/16/18  Physician: Serafina Mitchell, MD  Admission Diagnosis: PERIPHERAL VASCULAR DISEASE   HPI:   This is a 77 y.o. 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  She was taken back to the operating room on 02/29/12 for a left femoral endarterectomy with patch angioplasty.  She underwent angiogram in September 2018 b/c she had a significant drop in her ABI's.  At that time, her aortobifemoral graft was widely patent.  She had diffuse dz throughout her right superficial femoral and popliteal arteries.    She saw Dr. Trula Slade in June for c/o increased leg pain that limited her walking.  Right > left.  She did not have any open wounds at that time.  She was also having trouble with leg swelling as well as a burning type pain.  At that visit, she was still smoking and had increased how much she was smoking.  Dr. Trula Slade was reluctant at that time to proceed with arterial interventions with ongoing smoking.  He felt her sx had progressed.  He also felt she had a component of neuropathic pain and her neurontin was increased to 200 mg qhs (low dose due to unsteadiness with increased doses in the past).  She was also started on Pletal to see if she could get some benefit from it and 15-20 mmHg compression stockings for the edema.   She comes in today with complaints of a new painful ulcer on her right medial malleolus that has been present since after her visit with Dr. Trula Slade in June.  She states that she saw Dr. Delena Bali and he started her on Clindamycin and referred her to see [place].  She states that nothing makes it better.  She was also given pain medicine (Vicodin) by Dr. Delena Bali and this has also failed to give her relief.  She states that she did wear  the compression after her last visit, however, her son states that it was only for a few days.  She continues to have the burning pain in her foot.  She denies any fevers but states she has to wear a coat in her house b/c she is cold.  Son states that she is cold since getting older.   Her son states that she has quit smoking off and on.  When she quits she feels better.   She states she has not smoked in 2 weeks.   The pt is on a statin for cholesterol management.  She is on a CCB/ACEI/BB for blood pressure control.  She is on insulin for diabetes.    Hospital Course:  The patient was admitted to the hospital and taken to the operating room on 07/09/2018 and underwent: Procedure:   #1: Right femoral to below-knee popliteal artery bypass graft with ipsilateral non-reversed translocated vein                         #2: Redo right femoral artery exposure                         #3: Placement of Praveena incisional wound VAC    Findings: Healthy appearing below-knee popliteal artery.  The vein was of good caliber measuring 3.5 mm.  The proximal anastomosis is to the anterior side of the right limb of the aortobifemoral graft  The pt tolerated the procedure well and was transported to the PACU in good condition.   By POD 1, the pt had a palpable DP on the right.  Wound vac was continued.   Wound care for medial ankle ulcer. Encourage ambulation. She was having trouble with hypoglycemia and she was started on fluids.  Her insulin was held.  She was also confused.    ABI's on that day as follows:  RIGHT    LEFT    PRESSURE WAVEFORM  PRESSURE WAVEFORM  BRACHIAL Unable to obtain due to restricted limb Triphasic BRACHIAL 98 Triphasic  DP 66 Monophasic DP 21 Monophasic  AT   AT    PT 39 Monophasic PT 46 Monophasic  PER   PER    GREAT TOE 15 NA GREAT TOE  Absent    RIGHT LEFT  ABI 0.67 0.47  TBI 0.15 0.00   On POD 2, she was less confused.  She is complaining of being unable  to move her right leg but states this has been going on since June and is not new.  She has palpable DP pulses bilaterally.  Her creatinine had increased on POD 1 to 1.88 from 1.33.  This did resolve over her post operative course. Her ARB was discontinued. She did have acute blood loss anemia, but was tolerating this.  She says she still has burning on the bottom of her foot, which is most likely related to her neuropathy and explained to her that this may or may not improve.  Her overall foot pain has improved.  On POD 3, her ulcer was improving.  Initially, PT was recommending SNF, however, throughout her hospitalization, she did increase her mobility and it was then recommended she go home with HHPT.    On POD 4, she did have some right hip pain and back pain that resolved with sitting in the chair. Continued to encourage ambulation and OOB.    On POD 5, a right hip xray was obtained and was unremarkable.   She was discharged on on POD 7. Her insulin was adjusted for home and she was instructed to make an appointment with her PCP to have her glucose checked and evaluate her medications.     The remainder of the hospital course consisted of increasing mobilization and increasing intake of solids without difficulty.  CBC    Component Value Date/Time   WBC 10.5 07/13/2018 0428   RBC 2.84 (L) 07/13/2018 0428   HGB 8.3 (L) 07/13/2018 0428   HCT 25.6 (L) 07/13/2018 0428   PLT 286 07/13/2018 0428   MCV 90.1 07/13/2018 0428   MCH 29.2 07/13/2018 0428   MCHC 32.4 07/13/2018 0428   RDW 13.2 07/13/2018 0428    BMET    Component Value Date/Time   NA 137 07/13/2018 0428   K 4.8 07/13/2018 0428   CL 103 07/13/2018 0428   CO2 22 07/13/2018 0428   GLUCOSE 103 (H) 07/13/2018 0428   BUN 21 07/13/2018 0428   CREATININE 1.25 (H) 07/13/2018 0428   CALCIUM 8.1 (L) 07/13/2018 0428   GFRNONAA 40 (L) 07/13/2018 0428   GFRAA 46 (L) 07/13/2018 0428     Discharge Instructions    Discharge patient    Complete by:  As directed    Discharge after [facility] issues are arranged.   Discharge disposition:  01-Home or Self Care   Discharge patient  date:  07/16/2018      Discharge Diagnosis:  PERIPHERAL VASCULAR DISEASE  Secondary Diagnosis: Patient Active Problem List   Diagnosis Date Noted  . PAD (peripheral artery disease) ([place]) 07/09/2018  . Essential hypertension 06/26/2018  . Hyperlipidemia 06/26/2018  . 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 Claudication of both lower extremities ([place]) 05/28/2017  . Diabetes mellitus with peripheral vascular disease ([place]) 05/28/2017  . Type 2 diabetes mellitus with complication, with long-term current use of insulin ([place]) 06/15/2014  . AAA (abdominal aortic aneurysm) ([place]) 02/26/2012  . PVD (peripheral vascular disease) with claudication ([place]) 02/26/2012  . 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 AAA (abdominal aortic aneurysm) ([place]) 02/26/2012  . Arthritis   . 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 Blood transfusion 2011  . Cancer (HCC)    BREAST  . Claudication of both lower extremities ([place]) 05/28/2017  . COPD (chronic obstructive pulmonary disease) ([place])   . Depression   . Diabetes mellitus    type 2 IDDM x 10 years  . Diabetes mellitus with peripheral vascular disease ([place]) 05/28/2017  . GERD (gastroesophageal reflux disease)   . Hip pain   . Hypertension   . Hypothyroidism   . 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 Myocardial infarction [facility])    unsure, looks as if she may have had one in the past  . Peripheral vascular disease ([place])   . PVD (peripheral vascular disease) with claudication ([place]) 02/26/2012  . Sleep apnea    does not use cpap   . Type II or unspecified type diabetes mellitus with peripheral circulatory disorders, uncontrolled(250.72) 06/15/2014      Allergies as of 07/16/2018      Reactions   Penicillins Swelling, Rash   Has patient had a PCN reaction causing immediate rash, facial/tongue/throat swelling, SOB or lightheadedness with hypotension: No Has patient had a PCN reaction causing severe rash involving mucus membranes or skin necrosis: No Has patient had a PCN reaction that required hospitalization: No Has patient had a PCN reaction occurring within the last 10 years:No If all of the above answers are "NO", then may proceed with Cephalosporin use.      Medication List    TAKE these medications   alendronate 70 MG tablet Commonly known as:  FOSAMAX Take 70 mg by mouth every Friday.   amLODipine-benazepril 5-40 MG capsule Commonly known as:  LOTREL Take 1 capsule by mouth at bedtime.   anastrozole 1 MG tablet Commonly known as:  ARIMIDEX Take 1 mg by mouth daily.   atenolol 100 MG tablet Commonly known as:  TENORMIN Take 100 mg by mouth daily.   atorvastatin 40 MG tablet Commonly known as:  LIPITOR Take 40 mg by mouth at bedtime.   cilostazol 100 MG tablet Commonly known as:  PLETAL Take 1 tablet (100 mg total) by mouth 2 (two) times daily before a meal.   fenofibrate 160 MG tablet Take 160 mg by mouth daily.   gabapentin 100 MG capsule Commonly known as:  NEURONTIN Take 1 capsule (100 mg total) by mouth 2 (two) times daily.   gabapentin 100 MG capsule Commonly known as:  NEURONTIN Take 1 capsule (100 mg total) by mouth 2 (two) times daily.   Insulin Glargine 100 UNIT/ML Solostar Pen Commonly known as:  LANTUS Inject 20 Units into the skin daily at 10  pm. What changed:  how much to take   lansoprazole 30 MG capsule Commonly known as:  PREVACID Take 30 mg by mouth 2 (two) times daily.   levothyroxine 112 MCG tablet Commonly known as:  SYNTHROID, LEVOTHROID Take 112 mcg by mouth daily before breakfast.   losartan 100 MG tablet Commonly known as:  COZAAR Take 100 mg by mouth daily with  breakfast.   oxyCODONE-acetaminophen 5-325 MG tablet Commonly known as:  PERCOCET/ROXICET Take 1 tablet by mouth every 6 (six) hours as needed for severe pain.       Discharge Instructions: Discharge instructions  Lower Extremity Bypass Surgery  Please refer to the following instruction for your post-procedure care. Your surgeon or physician assistant will discuss any changes with you.  Activity  You are encouraged to walk as much as you can. You can slowly return to normal activities during the month after your surgery. Avoid strenuous activity and heavy lifting until your doctor tells you it's OK. Avoid activities such as vacuuming or swinging a golf club. Do not drive until your doctor give the OK and you are no longer taking prescription pain medications. It is also normal to have difficulty with sleep habits, eating and bowel movement after surgery. These will go away with time.  Bathing/Showering  Shower daily after you go home. Do not soak in a bathtub, hot tub, or swim until the incision heals completely.  Incision Care  Clean your incision with mild soap and water. Shower every day. Pat the area dry with a clean towel. You do not need a bandage unless otherwise instructed. Do not apply any ointments or creams to your incision. If you have open wounds you will be instructed how to care for them or a visiting nurse may be arranged for you. If you have staples or sutures along your incision they will be removed at your post-op appointment. You may have skin glue on your incision. Do not peel it off. It will come off on its own in about one week.  Wash the groin wound with soap and water daily and pat dry. (No tub bath-only shower)  Then put a dry gauze or washcloth in the groin to keep this area dry to help prevent wound infection.  Do this daily and as needed.  Do not use Vaseline or neosporin on your incisions.  Only use soap and water on your incisions and then protect and keep  dry.  Diet  Resume your normal diet. There are no special food restrictions following this procedure. A low fat/ low cholesterol diet is recommended for all patients with vascular disease. In order to heal from your surgery, it is CRITICAL to get adequate nutrition. Your body requires vitamins, minerals, and protein. Vegetables are the best source of vitamins and minerals. Vegetables also provide the perfect balance of protein. Processed food has little nutritional value, so try to avoid this.  Medications  Resume taking all your medications unless your doctor or physician assistant tells you not to. If your incision is causing pain, you may take over-the-counter pain relievers such as acetaminophen (Tylenol). If you were prescribed a stronger pain medication, please aware these medication can cause nausea and constipation. Prevent nausea by taking the medication with a snack or meal. Avoid constipation by drinking plenty of fluids and eating foods with high amount of fiber, such as fruits, vegetables, and grains. Take Colace 100 mg (an over-the-counter stool softener) twice a day as needed for constipation.  Do not  take Tylenol if you are taking prescription pain medications.  Decrease your insulin dose to 20u daily and do not take if your sugar is low (like you have done prior to admission to the hospital).  Make appointment with your primary care physician to check your sugar and evaluate your medications in the next week.  Follow Up  Our office will schedule a follow up appointment 2-3 weeks following discharge.  Please call us immediately for any of the following conditions  .Severe or worsening pain in your legs or feet while at rest or while walking .Increase pain, redness, warmth, or drainage (pus) from your incision site(s) Fever of 101 degree or higher The swelling in your leg with the bypass suddenly worsens and becomes more painful than when you were in the hospital If you have been  instructed to feel your graft pulse then you should do so every day. If you can no longer feel this pulse, call the office immediately. Not all patients are given this instruction.  Leg swelling is common after leg bypass surgery.  The swelling should improve over a few months following surgery. To improve the swelling, you may elevate your legs above the level of your heart while you are sitting or resting. Your surgeon or physician assistant may ask you to apply an ACE wrap or wear compression (TED) stockings to help to reduce swelling.  Reduce your risk of vascular disease  Stop smoking. If you would like help call [facility] at 1-800-QUIT-NOW 518-646-0405) or [place] at (978)123-5382.  Manage your cholesterol Maintain a desired weight Control your diabetes weight Control your diabetes Keep your blood pressure down  If you have any questions, please call the office at [phone]  Prescriptions given: 1.  Roxicet #30 No Refill  Disposition: home with HHPT  Patient's condition: is Good  Follow up: 1. Dr. Trula Slade in 2 weeks 2. PCP in one week to evaluate DM and medications.   Leontine Locket, PA-C [facility] [phone] 07/16/2018  7:47 AM  - For VQI Registry use ---   Post-op:  Wound infection: No  Graft infection: No  Transfusion: No    If yes, n/a units given New Arrhythmia: No Ipsilateral amputation: No, [ ]  Minor, [ ]  BKA, [ ]  AKA Discharge patency: [x ] Primary, [ ]  Primary assisted, [ ]  Secondary, [ ]  Occluded Patency judged by: [ ]  Dopper only, [ ]  Palpable graft pulse, [x]  Palpable distal pulse, [ ]  ABI inc. > 0.15, [ ]  Duplex Discharge ABI: R 0.67, L 0.47 D/C Ambulatory Status: Ambulatory  Complications: MI: No, [ ]  Troponin only, [ ]  EKG or Clinical CHF: No Resp failure:No, [ ]  Pneumonia, [ ]  Ventilator Chg in renal function: Yes, [ x] Inc. Cr > 0.5 (resolved), [ ]  Temp. Dialysis,  [ ]  Permanent dialysis Stroke: No, [ ]   Minor, [ ]  Major Return to OR: No  Reason for return to OR: [ ]  Bleeding, [ ]  Infection, [ ]  Thrombosis, [ ]  Revision  Discharge medications: Statin use:  yes ASA use:  no Plavix use:  no Beta blocker use: yes CCB use:  Yes ACEI use:   yes ARB use:  no Coumadin use: no

## 2018-07-16 NOTE — Discharge Instructions (Signed)
[facility]  Discharge instructions  Lower Extremity Bypass Surgery  Please refer to the following instruction for your post-procedure care. Your surgeon or physician assistant will discuss any changes with you.  Activity  You are encouraged to walk as much as you can. You can slowly return to normal activities during the month after your surgery. Avoid strenuous activity and heavy lifting until your doctor tells you it's OK. Avoid activities such as vacuuming or swinging a golf club. Do not drive until your doctor give the OK and you are no longer taking prescription pain medications. It is also normal to have difficulty with sleep habits, eating and bowel movement after surgery. These will go away with time.  Bathing/Showering  Shower daily after you go home. Do not soak in a bathtub, hot tub, or swim until the incision heals completely.  Incision Care  Clean your incision with mild soap and water. Shower every day. Pat the area dry with a clean towel. You do not need a bandage unless otherwise instructed. Do not apply any ointments or creams to your incision. If you have open wounds you will be instructed how to care for them or a visiting nurse may be arranged for you. If you have staples or sutures along your incision they will be removed at your post-op appointment. You may have skin glue on your incision. Do not peel it off. It will come off on its own in about one week.  Wash the groin wound with soap and water daily and pat dry. (No tub bath-only shower)  Then put a dry gauze or washcloth in the groin to keep this area dry to help prevent wound infection.  Do this daily and as needed.  Do not use Vaseline or neosporin on your incisions.  Only use soap and water on your incisions and then protect and keep dry.  Diet  Resume your normal diet. There are no special food restrictions following this procedure. A low fat/ low cholesterol diet is  recommended for all patients with vascular disease. In order to heal from your surgery, it is CRITICAL to get adequate nutrition. Your body requires vitamins, minerals, and protein. Vegetables are the best source of vitamins and minerals. Vegetables also provide the perfect balance of protein. Processed food has little nutritional value, so try to avoid this.  Medications  Resume taking all your medications unless your doctor or physician assistant tells you not to. If your incision is causing pain, you may take over-the-counter pain relievers such as acetaminophen (Tylenol). If you were prescribed a stronger pain medication, please aware these medication can cause nausea and constipation. Prevent nausea by taking the medication with a snack or meal. Avoid constipation by drinking plenty of fluids and eating foods with high amount of fiber, such as fruits, vegetables, and grains. Take Colace 100 mg (an over-the-counter stool softener) twice a day as needed for constipation.  Do not take Tylenol if you are taking prescription pain medications.  Decrease your insulin dose to 20u daily and do not take if your sugar is low (like you have done prior to admission to the hospital).  Make appointment with your primary care physician to check your sugar and evaluate your medications in the next week.  Follow Up  Our office will schedule a follow up appointment 2-3 weeks following discharge.  Please call us immediately for any of the following conditions  Severe or worsening pain in your legs or feet  while at rest or while walking Increase pain, redness, warmth, or drainage (pus) from your incision site(s) Fever of 101 degree or higher The swelling in your leg with the bypass suddenly worsens and becomes more painful than when you were in the hospital If you have been instructed to feel your graft pulse then you should do so every day. If you can no longer feel this pulse, call the office immediately. Not  all patients are given this instruction.  Leg swelling is common after leg bypass surgery.  The swelling should improve over a few months following surgery. To improve the swelling, you may elevate your legs above the level of your heart while you are sitting or resting. Your surgeon or physician assistant may ask you to apply an ACE wrap or wear compression (TED) stockings to help to reduce swelling.  Reduce your risk of vascular disease  Stop smoking. If you would like help call [facility] at 1-800-QUIT-NOW 7751548023) or [place] at (478)[phone].  Manage your cholesterol Maintain a desired weight Control your diabetes weight Control your diabetes Keep your blood pressure down  If you have any questions, please call the office at [phone]

## 2018-07-16 NOTE — Progress Notes (Signed)
Physical Therapy Treatment Patient Details Name: [name] MRN: [id number] DOB: 04/13/1941 Today's Date: 07/16/2018    History of Present Illness Pt is a 77 y.o. female admitted 07/09/18 for re-do of R femoral artery to below knee popliteal bypass graft. PMH includes PVD, DM, HTN, depression, COPD, AAA (2013).    PT Comments    Pt doing well. Ready for dc home with family support and HHPT.   Follow Up Recommendations  Home health PT;Supervision - Intermittent     Equipment Recommendations  None recommended by PT    Recommendations for Other Services       Precautions / Restrictions Precautions Precautions: Fall Precaution Comments: wound vac Restrictions Weight Bearing Restrictions: No    Mobility  Bed Mobility Overal bed mobility: Modified Independent Bed Mobility: Sit to Supine     Supine to sit: Supervision Sit to supine: Modified independent (Device/Increase time)   General bed mobility comments: Incr time  Transfers Overall transfer level: Needs assistance Equipment used: Rolling walker (2 wheeled) Transfers: Sit to/from Stand Sit to Stand: Supervision         General transfer comment: supervision for safety  Ambulation/Gait Ambulation/Gait assistance: Supervision Gait Distance (Feet): 240 Feet Assistive device: 4-wheeled walker;Rolling walker (2 wheeled) Gait Pattern/deviations: Step-through pattern;Trunk flexed;Antalgic;Decreased weight shift to right;Decreased stride length Gait velocity: decr Gait velocity interpretation: 1.31 - 2.62 ft/sec, indicative of limited community ambulator General Gait Details: supervision for safety   Stairs             Wheelchair Mobility    Modified Rankin (Stroke Patients Only)       Balance Overall balance assessment: Needs assistance Sitting-balance support: No upper extremity supported;Feet supported Sitting balance-Leahy Scale: Fair     Standing balance support: Bilateral upper extremity  supported Standing balance-Leahy Scale: Poor Standing balance comment: walker and supervision                            Cognition Arousal/Alertness: Awake/alert Behavior During Therapy: WFL for tasks assessed/performed Overall Cognitive Status: Within Functional Limits for tasks assessed                                        Exercises      General Comments General comments (skin integrity, edema, etc.): VSS      Pertinent Vitals/Pain Pain Assessment: Faces Faces Pain Scale: Hurts little more Pain Location: rt hip Pain Descriptors / Indicators: Grimacing;Guarding Pain Intervention(s): Monitored during session;Repositioned    Home Living                      Prior Function            PT Goals (current goals can now be found in the care plan section) Acute Rehab PT Goals Patient Stated Goal: unable to state Progress towards PT goals: Progressing toward goals    Frequency    Min 3X/week      PT Plan Current plan remains appropriate    Co-evaluation              AM-PAC PT "6 Clicks" Daily Activity  Outcome Measure  Difficulty turning over in bed (including adjusting bedclothes, sheets and blankets)?: A Little Difficulty moving from lying on back to sitting on the side of the bed? : A Little Difficulty sitting down on and standing up from  a chair with arms (e.g., wheelchair, bedside commode, etc,.)?: A Little Help needed moving to and from a bed to chair (including a wheelchair)?: A Little Help needed walking in hospital room?: A Little Help needed climbing 3-5 steps with a railing? : A Little 6 Click Score: 18    End of Session Equipment Utilized During Treatment: Gait belt Activity Tolerance: Patient tolerated treatment well Patient left: with call bell/phone within reach;in bed;with bed alarm set Nurse Communication: Mobility status PT Visit Diagnosis: Other abnormalities of gait and mobility (R26.89);Pain Pain  - Right/Left: Right Pain - part of body: Hip     Time: 7915-0569 PT Time Calculation (min) (ACUTE ONLY): 16 min  Charges:  $Gait Training: 8-22 mins                     [place] Pager [phone] Office [place] 07/16/2018, 11:49 AM

## 2018-07-16 NOTE — Care Management Note (Signed)
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 PT/OT evals pending - CM will follow for transition of care needs  Expected Discharge Date:  07/16/18               Expected Discharge Plan:  [place]  In-House Referral:  Clinical Social Work  Discharge planning Services  CM Consult  Post Acute Care Choice:  Home Health Choice offered to:  Patient  DME Arranged:    DME Agency:     HH Arranged:  PT HH Agency:  [facility]  Status of Service:  Completed, signed off  If discussed at [place] of Stay Meetings, dates discussed:    Discharge Disposition: home/home health   Additional Comments:  07/16/18- 1020- Atleigh Gruen RN, CM- pt has decided to return home with [facility] and not go to [facility], CM has notified Tiffany with [facility] for [facility] needs- order in for HHPT- pt does not have any DME needs, will transition back home.   Dawayne Patricia, RN 07/16/2018, 10:21 AM

## 2018-07-16 NOTE — Progress Notes (Signed)
Occupational Therapy Treatment Patient Details Name: [name] MRN: [id number] DOB: 09-12-1941 Today's Date: 07/16/2018    History of present illness Pt is a 77 y.o. female admitted 07/09/18 for re-do of R femoral artery to below knee popliteal bypass graft. PMH includes PVD, DM, HTN, depression, COPD, AAA (2013).   OT comments  Pt progressing towards established goals and demonstrating increased functional performance compared to prior session. Providing education on LB ADLs, toileting, and tub transfer. Pt donning underwear with Min Guard A for safety and Min cues for compensatory techniques and wound vac management. Pt verbalizing understanding of toilet and tub transfer and reports she has all needed equipment at home. Update dc recommendation to home with HHOT and will continue to follow acutely as admitted.    Follow Up Recommendations  Home health OT;Supervision/Assistance - 24 hour    Equipment Recommendations  None recommended by OT    Recommendations for Other Services      Precautions / Restrictions Precautions Precautions: Fall Precaution Comments: wound vac Restrictions Weight Bearing Restrictions: No       Mobility Bed Mobility Overal bed mobility: Needs Assistance Bed Mobility: Supine to Sit     Supine to sit: Supervision     General bed mobility comments: supervision for safety  Transfers Overall transfer level: Needs assistance Equipment used: Rolling walker (2 wheeled) Transfers: Sit to/from Stand Sit to Stand: Supervision         General transfer comment: supervision for safety    Balance Overall balance assessment: Needs assistance Sitting-balance support: No upper extremity supported;Feet supported Sitting balance-Leahy Scale: Fair     Standing balance support: Bilateral upper extremity supported Standing balance-Leahy Scale: Fair Standing balance comment: Able to maintain static standing while at sink for hand hygiene                            ADL either performed or assessed with clinical judgement   ADL Overall ADL's : Needs assistance/impaired Eating/Feeding: Set up;Supervision/ safety;Sitting Eating/Feeding Details (indicate cue type and reason): Eating breakfast in recliner Grooming: Wash/dry hands;Set up;Supervision/safety;Standing Grooming Details (indicate cue type and reason): Supervision for safety             Lower Body Dressing: Min guard;Sit to/from stand Lower Body Dressing Details (indicate cue type and reason): Min Guard A for safety. Providing educating on donning over right leg first and placing wound vac through the pant leg of her underwear.  Toilet Transfer: Min guard;Ambulation;RW Toilet Transfer Details (indicate cue type and reason): Pt performing simulated toilet transfer to recliner with Min guard A for safety       Tub/Shower Transfer Details (indicate cue type and reason): Educating pt on safe tub transfer. Pt verablized understanding and reports she has equipment at home for safety as well Functional mobility during ADLs: Rolling walker;Min guard General ADL Comments: Pt demonstrating increased functional performance compared to prior session. Providing education on LB ADLs, wound vac management, toileting, and tub transfer.     Vision       Perception     Praxis      Cognition Arousal/Alertness: Awake/alert Behavior During Therapy: WFL for tasks assessed/performed Overall Cognitive Status: Within Functional Limits for tasks assessed  Exercises     Shoulder Instructions       General Comments VSS    Pertinent Vitals/ Pain       Pain Assessment: Faces Faces Pain Scale: Hurts little more Pain Location: rt hip Pain Descriptors / Indicators: Grimacing;Guarding Pain Intervention(s): Monitored during session;Limited activity within patient's tolerance;Repositioned  Home Living                                           Prior Functioning/Environment              Frequency  Min 2X/week        Progress Toward Goals  OT Goals(current goals can now be found in the care plan section)  Progress towards OT goals: Progressing toward goals  Acute Rehab OT Goals Patient Stated Goal: unable to state OT Goal Formulation: With family Time For Goal Achievement: 07/24/18 Potential to Achieve Goals: Good ADL Goals Pt Will Perform Grooming: with supervision;sitting Pt Will Perform Upper Body Dressing: with supervision;sitting Pt Will Perform Lower Body Dressing: with min assist;sit to/from stand Pt Will Transfer to Toilet: with min assist;stand pivot transfer;bedside commode Pt Will Perform Toileting - Clothing Manipulation and hygiene: with min assist;sit to/from stand Additional ADL Goal #1: Pt will use environmental clues to answer orientation questions accurately.  Plan Discharge plan needs to be updated;Frequency remains appropriate    Co-evaluation                 AM-PAC PT "6 Clicks" Daily Activity     Outcome Measure   Help from another person eating meals?: None Help from another person taking care of personal grooming?: A Little Help from another person toileting, which includes using toliet, bedpan, or urinal?: A Little Help from another person bathing (including washing, rinsing, drying)?: A Little Help from another person to put on and taking off regular upper body clothing?: None Help from another person to put on and taking off regular lower body clothing?: A Little 6 Click Score: 20    End of Session Equipment Utilized During Treatment: Rolling walker  OT Visit Diagnosis: Unsteadiness on feet (R26.81);Other abnormalities of gait and mobility (R26.89);Pain;Other symptoms and signs involving cognitive function;Muscle weakness (generalized) (M62.81);History of falling (Z91.81)   Activity Tolerance Patient tolerated treatment well    Patient Left with call bell/phone within reach;with family/visitor present;in chair;with chair alarm set   Nurse Communication Mobility status;Other (comment)(Wants a new breakfast)        Time: 6962-9528 OT Time Calculation (min): 21 min  Charges: OT General Charges $OT Visit: 1 Visit OT Treatments $Self Care/Home Management : 8-22 mins  [place], OTR/L Acute Rehab Pager: [phone] Office: [place] 07/16/2018, 9:02 AM

## 2018-07-16 NOTE — Progress Notes (Addendum)
  Progress Note    07/16/2018 7:27 AM 7 Days Post-Op  Subjective:  Wants to go home and see her husband  Afebrile   Vitals:   07/16/18 0000 07/16/18 0454  BP: 135/63   Pulse: 65   Resp: 10   Temp: 98.5 F (36.9 C) 98 F (36.7 C)  SpO2: 100%     Physical Exam: Cardiac:  regular Lungs:  Non labored Incisions:  pravena with good seal; other incisions look good.  Extremities:  +palpable right DP pulse   CBC    Component Value Date/Time   WBC 10.5 07/13/2018 0428   RBC 2.84 (L) 07/13/2018 0428   HGB 8.3 (L) 07/13/2018 0428   HCT 25.6 (L) 07/13/2018 0428   PLT 286 07/13/2018 0428   MCV 90.1 07/13/2018 0428   MCH 29.2 07/13/2018 0428   MCHC 32.4 07/13/2018 0428   RDW 13.2 07/13/2018 0428    BMET    Component Value Date/Time   NA 137 07/13/2018 0428   K 4.8 07/13/2018 0428   CL 103 07/13/2018 0428   CO2 22 07/13/2018 0428   GLUCOSE 103 (H) 07/13/2018 0428   BUN 21 07/13/2018 0428   CREATININE 1.25 (H) 07/13/2018 0428   CALCIUM 8.1 (L) 07/13/2018 0428   GFRNONAA 40 (L) 07/13/2018 0428   GFRAA 46 (L) 07/13/2018 0428    INR    Component Value Date/Time   INR 0.96 07/04/2018 0823     Intake/Output Summary (Last 24 hours) at 07/16/2018 0727 Last data filed at 07/16/2018 0400 Gross per 24 hour  Intake 480 ml  Output 800 ml  Net -320 ml     Assessment:  77 y.o. female is s/p:  R fem pop bypass with vein  7 Days Post-Op  Plan: -pt doing much better this am and walked in the hallways yesterday.  PT now recommending HHPT.  -continues to have palpable DP pulse (right) -will ask case management to help arrange [facility] -discussed with pt making appointment with her PCP in the next couple of days to check her DM and possible adjustment to meds; she states she [place] near the [place] and can call to be seen.     Leontine Locket, PA-C [facility] [phone] 07/16/2018 7:27 AM

## 2018-07-17 DIAGNOSIS — Z48812 Encounter for surgical aftercare following surgery on the circulatory system: Secondary | ICD-10-CM | POA: Diagnosis not present

## 2018-07-17 DIAGNOSIS — E1151 Type 2 diabetes mellitus with diabetic peripheral angiopathy without gangrene: Secondary | ICD-10-CM | POA: Diagnosis not present

## 2018-07-17 DIAGNOSIS — I10 Essential (primary) hypertension: Secondary | ICD-10-CM | POA: Diagnosis not present

## 2018-07-17 DIAGNOSIS — Z794 Long term (current) use of insulin: Secondary | ICD-10-CM | POA: Diagnosis not present

## 2018-07-17 DIAGNOSIS — I70221 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, right leg: Secondary | ICD-10-CM | POA: Diagnosis not present

## 2018-07-19 DIAGNOSIS — Z48812 Encounter for surgical aftercare following surgery on the circulatory system: Secondary | ICD-10-CM | POA: Diagnosis not present

## 2018-07-19 DIAGNOSIS — I10 Essential (primary) hypertension: Secondary | ICD-10-CM | POA: Diagnosis not present

## 2018-07-19 DIAGNOSIS — I70221 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, right leg: Secondary | ICD-10-CM | POA: Diagnosis not present

## 2018-07-19 DIAGNOSIS — Z794 Long term (current) use of insulin: Secondary | ICD-10-CM | POA: Diagnosis not present

## 2018-07-19 DIAGNOSIS — E1151 Type 2 diabetes mellitus with diabetic peripheral angiopathy without gangrene: Secondary | ICD-10-CM | POA: Diagnosis not present

## 2018-07-22 DIAGNOSIS — Z48812 Encounter for surgical aftercare following surgery on the circulatory system: Secondary | ICD-10-CM | POA: Diagnosis not present

## 2018-07-22 DIAGNOSIS — E1151 Type 2 diabetes mellitus with diabetic peripheral angiopathy without gangrene: Secondary | ICD-10-CM | POA: Diagnosis not present

## 2018-07-22 DIAGNOSIS — I10 Essential (primary) hypertension: Secondary | ICD-10-CM | POA: Diagnosis not present

## 2018-07-22 DIAGNOSIS — Z794 Long term (current) use of insulin: Secondary | ICD-10-CM | POA: Diagnosis not present

## 2018-07-22 DIAGNOSIS — I70221 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, right leg: Secondary | ICD-10-CM | POA: Diagnosis not present

## 2018-07-23 DIAGNOSIS — Z1339 Encounter for screening examination for other mental health and behavioral disorders: Secondary | ICD-10-CM | POA: Diagnosis not present

## 2018-07-23 DIAGNOSIS — I739 Peripheral vascular disease, unspecified: Secondary | ICD-10-CM | POA: Diagnosis not present

## 2018-07-23 DIAGNOSIS — I10 Essential (primary) hypertension: Secondary | ICD-10-CM | POA: Diagnosis not present

## 2018-07-23 DIAGNOSIS — E039 Hypothyroidism, unspecified: Secondary | ICD-10-CM | POA: Diagnosis not present

## 2018-07-23 DIAGNOSIS — E11649 Type 2 diabetes mellitus with hypoglycemia without coma: Secondary | ICD-10-CM | POA: Diagnosis not present

## 2018-07-23 DIAGNOSIS — I70221 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, right leg: Secondary | ICD-10-CM | POA: Diagnosis not present

## 2018-07-23 DIAGNOSIS — R636 Underweight: Secondary | ICD-10-CM | POA: Diagnosis not present

## 2018-07-23 DIAGNOSIS — E1151 Type 2 diabetes mellitus with diabetic peripheral angiopathy without gangrene: Secondary | ICD-10-CM | POA: Diagnosis not present

## 2018-07-23 DIAGNOSIS — E1140 Type 2 diabetes mellitus with diabetic neuropathy, unspecified: Secondary | ICD-10-CM | POA: Diagnosis not present

## 2018-07-23 DIAGNOSIS — E785 Hyperlipidemia, unspecified: Secondary | ICD-10-CM | POA: Diagnosis not present

## 2018-07-23 DIAGNOSIS — Z48812 Encounter for surgical aftercare following surgery on the circulatory system: Secondary | ICD-10-CM | POA: Diagnosis not present

## 2018-07-23 DIAGNOSIS — Z794 Long term (current) use of insulin: Secondary | ICD-10-CM | POA: Diagnosis not present

## 2018-07-23 DIAGNOSIS — Z23 Encounter for immunization: Secondary | ICD-10-CM | POA: Diagnosis not present

## 2018-07-25 DIAGNOSIS — I70221 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, right leg: Secondary | ICD-10-CM | POA: Diagnosis not present

## 2018-07-25 DIAGNOSIS — I10 Essential (primary) hypertension: Secondary | ICD-10-CM | POA: Diagnosis not present

## 2018-07-25 DIAGNOSIS — Z794 Long term (current) use of insulin: Secondary | ICD-10-CM | POA: Diagnosis not present

## 2018-07-25 DIAGNOSIS — Z48812 Encounter for surgical aftercare following surgery on the circulatory system: Secondary | ICD-10-CM | POA: Diagnosis not present

## 2018-07-25 DIAGNOSIS — E1151 Type 2 diabetes mellitus with diabetic peripheral angiopathy without gangrene: Secondary | ICD-10-CM | POA: Diagnosis not present

## 2018-07-28 DIAGNOSIS — E1151 Type 2 diabetes mellitus with diabetic peripheral angiopathy without gangrene: Secondary | ICD-10-CM | POA: Diagnosis not present

## 2018-07-28 DIAGNOSIS — I70221 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, right leg: Secondary | ICD-10-CM | POA: Diagnosis not present

## 2018-07-28 DIAGNOSIS — I10 Essential (primary) hypertension: Secondary | ICD-10-CM | POA: Diagnosis not present

## 2018-07-28 DIAGNOSIS — Z48812 Encounter for surgical aftercare following surgery on the circulatory system: Secondary | ICD-10-CM | POA: Diagnosis not present

## 2018-07-28 DIAGNOSIS — Z794 Long term (current) use of insulin: Secondary | ICD-10-CM | POA: Diagnosis not present

## 2018-07-31 DIAGNOSIS — E1151 Type 2 diabetes mellitus with diabetic peripheral angiopathy without gangrene: Secondary | ICD-10-CM | POA: Diagnosis not present

## 2018-07-31 DIAGNOSIS — Z794 Long term (current) use of insulin: Secondary | ICD-10-CM | POA: Diagnosis not present

## 2018-07-31 DIAGNOSIS — I70221 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, right leg: Secondary | ICD-10-CM | POA: Diagnosis not present

## 2018-07-31 DIAGNOSIS — I10 Essential (primary) hypertension: Secondary | ICD-10-CM | POA: Diagnosis not present

## 2018-07-31 DIAGNOSIS — Z48812 Encounter for surgical aftercare following surgery on the circulatory system: Secondary | ICD-10-CM | POA: Diagnosis not present

## 2018-08-06 DIAGNOSIS — E1151 Type 2 diabetes mellitus with diabetic peripheral angiopathy without gangrene: Secondary | ICD-10-CM | POA: Diagnosis not present

## 2018-08-06 DIAGNOSIS — Z48812 Encounter for surgical aftercare following surgery on the circulatory system: Secondary | ICD-10-CM | POA: Diagnosis not present

## 2018-08-06 DIAGNOSIS — I70221 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, right leg: Secondary | ICD-10-CM | POA: Diagnosis not present

## 2018-08-06 DIAGNOSIS — Z794 Long term (current) use of insulin: Secondary | ICD-10-CM | POA: Diagnosis not present

## 2018-08-06 DIAGNOSIS — I10 Essential (primary) hypertension: Secondary | ICD-10-CM | POA: Diagnosis not present

## 2018-08-08 ENCOUNTER — Telehealth: Payer: Self-pay

## 2018-08-08 NOTE — Telephone Encounter (Signed)
Placed a call to George Ina, Nutritional therapist with [facility] regarding an e-mail concerning Ms. Speciale. [facility] nurse is concerned that patient's groin incision may be infected and feels patient needs to be seen. Patient has a post-op appt with Dr. Trula Slade Monday, Aug 11, 2018 but I instructed Summer that if the [facility] nurse felt the patient was critical and could not wait for the patient to go to the ED. [facility] nurse will follow up with patient and instruct patient according to her assessment.

## 2018-08-10 DIAGNOSIS — Z48812 Encounter for surgical aftercare following surgery on the circulatory system: Secondary | ICD-10-CM | POA: Diagnosis not present

## 2018-08-10 DIAGNOSIS — I10 Essential (primary) hypertension: Secondary | ICD-10-CM | POA: Diagnosis not present

## 2018-08-10 DIAGNOSIS — E1151 Type 2 diabetes mellitus with diabetic peripheral angiopathy without gangrene: Secondary | ICD-10-CM | POA: Diagnosis not present

## 2018-08-10 DIAGNOSIS — Z794 Long term (current) use of insulin: Secondary | ICD-10-CM | POA: Diagnosis not present

## 2018-08-10 DIAGNOSIS — I70221 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, right leg: Secondary | ICD-10-CM | POA: Diagnosis not present

## 2018-08-11 ENCOUNTER — Other Ambulatory Visit: Payer: Self-pay

## 2018-08-11 ENCOUNTER — Ambulatory Visit (INDEPENDENT_AMBULATORY_CARE_PROVIDER_SITE_OTHER): Payer: Medicare HMO | Admitting: Surgery

## 2018-08-11 ENCOUNTER — Encounter: Payer: Self-pay | Admitting: Surgery

## 2018-08-11 VITALS — BP 180/69 | HR 67 | Temp 97.0°F | Resp 14 | Ht 62.0 in | Wt 103.0 lb

## 2018-08-11 DIAGNOSIS — I70213 Atherosclerosis of native arteries of extremities with intermittent claudication, bilateral legs: Secondary | ICD-10-CM

## 2018-08-11 MED ORDER — OXYCODONE-ACETAMINOPHEN 5-325 MG PO TABS: 1.0000 | ORAL_TABLET | ORAL | 0 refills | Status: DC | PRN

## 2018-08-11 NOTE — Progress Notes (Signed)
Patient name: [name] MRN: [id number] DOB: Jun 08, 1941 Sex: female  REASON FOR VISIT:     post op  HISTORY OF PRESENT ILLNESS:   Alicia Luevanos Bullinsis a 77 y.o.femalewho is a former patient of Dr. Kellie Simmering. She is status post aortobifemoral bypass graft on 12/21/2010 using a 12 x 7 graft. 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 On 02/29/2012 patient went back to the operating room and had a left femoral endarterectomy with patch angioplasty.  I saw her over the summer where she had developed a right medial ankle ulcer.  Angiography revealed an occluded right superficial femoral artery.  On 07/09/2018 she underwent a right femoral to above-knee popliteal artery bypass graft with reverse saphenous vein.  Today she is complaining of lower back pain and right leg pain and tingling from her toes up to her knee.  She is having drainage from her incisions.  She does think that the wound is getting better.  CURRENT MEDICATIONS:    Current Outpatient Medications  Medication Sig Dispense Refill  . alendronate (FOSAMAX) 70 MG tablet Take 70 mg by mouth every Friday.  5  . amLODipine-benazepril (LOTREL) 5-40 MG capsule Take 1 capsule by mouth at bedtime.  5  . anastrozole (ARIMIDEX) 1 MG tablet Take 1 mg by mouth daily.    Marland Kitchen atenolol (TENORMIN) 100 MG tablet Take 100 mg by mouth daily.    Marland Kitchen atorvastatin (LIPITOR) 40 MG tablet Take 40 mg by mouth at bedtime.  5  . cilostazol (PLETAL) 100 MG tablet Take 1 tablet (100 mg total) by mouth 2 (two) times daily before a meal. 60 tablet 11  . fenofibrate 160 MG tablet Take 160 mg by mouth daily.    Marland Kitchen gabapentin (NEURONTIN) 100 MG capsule Take 1 capsule (100 mg total) by mouth 2 (two) times daily. 1 capsule 10  . gabapentin (NEURONTIN) 100 MG capsule Take 1 capsule (100 mg total) by mouth 2 (two) times daily. 180 capsule 2  . Insulin Glargine (LANTUS SOLOSTAR) 100 UNIT/ML Solostar Pen  Inject 20 Units into the skin daily at 10 pm.    . lansoprazole (PREVACID) 30 MG capsule Take 30 mg by mouth 2 (two) times daily.  5  . levothyroxine (SYNTHROID, LEVOTHROID) 112 MCG tablet Take 112 mcg by mouth daily before breakfast.     . losartan (COZAAR) 100 MG tablet Take 100 mg by mouth daily with breakfast.  0  . oxyCODONE-acetaminophen (PERCOCET/ROXICET) 5-325 MG tablet Take 1 tablet by mouth every 6 (six) hours as needed for severe pain. (Patient not taking: Reported on 08/11/2018) 30 tablet 0   No current facility-administered medications for this visit.     REVIEW OF SYSTEMS:   [X]  denotes positive finding, [ ]  denotes negative finding Cardiac  Comments:  Chest pain or chest pressure:    Shortness of breath upon exertion:    Short of breath when lying flat:    Irregular heart rhythm:    Constitutional    Fever or chills:      PHYSICAL EXAM:   Vitals:   08/11/18 1106  BP: (!) 180/69  Pulse: 67  Resp: 14  Temp: (!) 97 F (36.1 C)  TempSrc: Oral  SpO2: 100%  Weight: 103 lb (46.7 kg)  Height: 5\' 2"  (6.295 m)    GENERAL: The patient is a well-nourished female, in no acute distress. The vital signs are documented above. CARDIOVASCULAR: There is a regular rate and rhythm. PULMONARY: Non-labored respirations Brisk  dorsalis pedis and posterior tibial Doppler signals. The wound appears to be improving approximately 1.5 cm in diameter.  She is having serous drainage from 1 of the vein harvest incisions which I probed with a Q-tip and evacuated additional fluid.  STUDIES:       MEDICAL ISSUES:   Status post right femoral-popliteal bypass graft: The patient's bypass graft is widely patent.  She does have a fair amount of edema in that leg.  I also opened up a seroma from 1 of the vein harvest incisions.  Overall, I feel she is recovering as expected.  I have encouraged her to keep her leg elevated.  She will follow-up with me in 3 weeks for wound check.  I am  referring her to the wound center for management of her right medial malleolus ulcer.  I have encouraged her to be as active as possible.  [name], MD [facility] [phone] Pager [phone]

## 2018-08-12 DIAGNOSIS — I70221 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, right leg: Secondary | ICD-10-CM | POA: Diagnosis not present

## 2018-08-12 DIAGNOSIS — I10 Essential (primary) hypertension: Secondary | ICD-10-CM | POA: Diagnosis not present

## 2018-08-12 DIAGNOSIS — Z48812 Encounter for surgical aftercare following surgery on the circulatory system: Secondary | ICD-10-CM | POA: Diagnosis not present

## 2018-08-12 DIAGNOSIS — E1151 Type 2 diabetes mellitus with diabetic peripheral angiopathy without gangrene: Secondary | ICD-10-CM | POA: Diagnosis not present

## 2018-08-12 DIAGNOSIS — Z794 Long term (current) use of insulin: Secondary | ICD-10-CM | POA: Diagnosis not present

## 2018-08-15 DIAGNOSIS — E1151 Type 2 diabetes mellitus with diabetic peripheral angiopathy without gangrene: Secondary | ICD-10-CM | POA: Diagnosis not present

## 2018-08-15 DIAGNOSIS — Z48812 Encounter for surgical aftercare following surgery on the circulatory system: Secondary | ICD-10-CM | POA: Diagnosis not present

## 2018-08-15 DIAGNOSIS — I10 Essential (primary) hypertension: Secondary | ICD-10-CM | POA: Diagnosis not present

## 2018-08-15 DIAGNOSIS — I70221 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, right leg: Secondary | ICD-10-CM | POA: Diagnosis not present

## 2018-08-15 DIAGNOSIS — Z794 Long term (current) use of insulin: Secondary | ICD-10-CM | POA: Diagnosis not present

## 2018-08-18 DIAGNOSIS — I872 Venous insufficiency (chronic) (peripheral): Secondary | ICD-10-CM | POA: Diagnosis not present

## 2018-08-18 DIAGNOSIS — E1151 Type 2 diabetes mellitus with diabetic peripheral angiopathy without gangrene: Secondary | ICD-10-CM | POA: Diagnosis not present

## 2018-08-18 DIAGNOSIS — I87311 Chronic venous hypertension (idiopathic) with ulcer of right lower extremity: Secondary | ICD-10-CM | POA: Diagnosis not present

## 2018-08-18 DIAGNOSIS — L97322 Non-pressure chronic ulcer of left ankle with fat layer exposed: Secondary | ICD-10-CM | POA: Diagnosis not present

## 2018-08-18 DIAGNOSIS — I10 Essential (primary) hypertension: Secondary | ICD-10-CM | POA: Diagnosis not present

## 2018-08-18 DIAGNOSIS — M1990 Unspecified osteoarthritis, unspecified site: Secondary | ICD-10-CM | POA: Diagnosis not present

## 2018-08-18 DIAGNOSIS — Z86718 Personal history of other venous thrombosis and embolism: Secondary | ICD-10-CM | POA: Diagnosis not present

## 2018-08-18 DIAGNOSIS — I2510 Atherosclerotic heart disease of native coronary artery without angina pectoris: Secondary | ICD-10-CM | POA: Diagnosis not present

## 2018-08-18 DIAGNOSIS — E11622 Type 2 diabetes mellitus with other skin ulcer: Secondary | ICD-10-CM | POA: Diagnosis not present

## 2018-08-18 DIAGNOSIS — Z87891 Personal history of nicotine dependence: Secondary | ICD-10-CM | POA: Diagnosis not present

## 2018-08-20 DIAGNOSIS — I10 Essential (primary) hypertension: Secondary | ICD-10-CM | POA: Diagnosis not present

## 2018-08-20 DIAGNOSIS — E1151 Type 2 diabetes mellitus with diabetic peripheral angiopathy without gangrene: Secondary | ICD-10-CM | POA: Diagnosis not present

## 2018-08-20 DIAGNOSIS — Z48812 Encounter for surgical aftercare following surgery on the circulatory system: Secondary | ICD-10-CM | POA: Diagnosis not present

## 2018-08-20 DIAGNOSIS — Z794 Long term (current) use of insulin: Secondary | ICD-10-CM | POA: Diagnosis not present

## 2018-08-20 DIAGNOSIS — I70221 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, right leg: Secondary | ICD-10-CM | POA: Diagnosis not present

## 2018-08-22 DIAGNOSIS — I87311 Chronic venous hypertension (idiopathic) with ulcer of right lower extremity: Secondary | ICD-10-CM | POA: Diagnosis not present

## 2018-08-22 DIAGNOSIS — I10 Essential (primary) hypertension: Secondary | ICD-10-CM | POA: Diagnosis not present

## 2018-08-22 DIAGNOSIS — Z794 Long term (current) use of insulin: Secondary | ICD-10-CM | POA: Diagnosis not present

## 2018-08-22 DIAGNOSIS — L97512 Non-pressure chronic ulcer of other part of right foot with fat layer exposed: Secondary | ICD-10-CM | POA: Diagnosis not present

## 2018-08-22 DIAGNOSIS — E1151 Type 2 diabetes mellitus with diabetic peripheral angiopathy without gangrene: Secondary | ICD-10-CM | POA: Diagnosis not present

## 2018-08-25 DIAGNOSIS — I70233 Atherosclerosis of native arteries of right leg with ulceration of ankle: Secondary | ICD-10-CM | POA: Diagnosis not present

## 2018-08-25 DIAGNOSIS — I87311 Chronic venous hypertension (idiopathic) with ulcer of right lower extremity: Secondary | ICD-10-CM | POA: Diagnosis not present

## 2018-08-25 DIAGNOSIS — I10 Essential (primary) hypertension: Secondary | ICD-10-CM | POA: Diagnosis not present

## 2018-08-25 DIAGNOSIS — E1151 Type 2 diabetes mellitus with diabetic peripheral angiopathy without gangrene: Secondary | ICD-10-CM | POA: Diagnosis not present

## 2018-08-25 DIAGNOSIS — I2510 Atherosclerotic heart disease of native coronary artery without angina pectoris: Secondary | ICD-10-CM | POA: Diagnosis not present

## 2018-08-25 DIAGNOSIS — L97312 Non-pressure chronic ulcer of right ankle with fat layer exposed: Secondary | ICD-10-CM | POA: Diagnosis not present

## 2018-08-27 ENCOUNTER — Telehealth: Payer: Self-pay | Admitting: *Deleted

## 2018-08-27 DIAGNOSIS — Z5321 Procedure and treatment not carried out due to patient leaving prior to being seen by health care provider: Secondary | ICD-10-CM | POA: Diagnosis not present

## 2018-08-27 NOTE — Telephone Encounter (Signed)
Spoke with patient's son who states that leg is still draining after 8 weeks and now has a "bad odor" can not confirm if patient has fever or chills. Home Health is in seeing patient. Instructed to get mother to [facility] ER for evaluation.

## 2018-08-28 DIAGNOSIS — T8189XA Other complications of procedures, not elsewhere classified, initial encounter: Secondary | ICD-10-CM | POA: Diagnosis not present

## 2018-08-29 DIAGNOSIS — L97512 Non-pressure chronic ulcer of other part of right foot with fat layer exposed: Secondary | ICD-10-CM | POA: Diagnosis not present

## 2018-08-29 DIAGNOSIS — I87311 Chronic venous hypertension (idiopathic) with ulcer of right lower extremity: Secondary | ICD-10-CM | POA: Diagnosis not present

## 2018-08-29 DIAGNOSIS — Z794 Long term (current) use of insulin: Secondary | ICD-10-CM | POA: Diagnosis not present

## 2018-08-29 DIAGNOSIS — I10 Essential (primary) hypertension: Secondary | ICD-10-CM | POA: Diagnosis not present

## 2018-08-29 DIAGNOSIS — E1151 Type 2 diabetes mellitus with diabetic peripheral angiopathy without gangrene: Secondary | ICD-10-CM | POA: Diagnosis not present

## 2018-09-01 DIAGNOSIS — I87311 Chronic venous hypertension (idiopathic) with ulcer of right lower extremity: Secondary | ICD-10-CM | POA: Diagnosis not present

## 2018-09-01 DIAGNOSIS — E1151 Type 2 diabetes mellitus with diabetic peripheral angiopathy without gangrene: Secondary | ICD-10-CM | POA: Diagnosis not present

## 2018-09-01 DIAGNOSIS — I2510 Atherosclerotic heart disease of native coronary artery without angina pectoris: Secondary | ICD-10-CM | POA: Diagnosis not present

## 2018-09-01 DIAGNOSIS — I70233 Atherosclerosis of native arteries of right leg with ulceration of ankle: Secondary | ICD-10-CM | POA: Diagnosis not present

## 2018-09-01 DIAGNOSIS — L97312 Non-pressure chronic ulcer of right ankle with fat layer exposed: Secondary | ICD-10-CM | POA: Diagnosis not present

## 2018-09-01 DIAGNOSIS — I10 Essential (primary) hypertension: Secondary | ICD-10-CM | POA: Diagnosis not present

## 2018-09-03 DIAGNOSIS — I10 Essential (primary) hypertension: Secondary | ICD-10-CM | POA: Diagnosis not present

## 2018-09-03 DIAGNOSIS — I87311 Chronic venous hypertension (idiopathic) with ulcer of right lower extremity: Secondary | ICD-10-CM | POA: Diagnosis not present

## 2018-09-03 DIAGNOSIS — Z794 Long term (current) use of insulin: Secondary | ICD-10-CM | POA: Diagnosis not present

## 2018-09-03 DIAGNOSIS — E1151 Type 2 diabetes mellitus with diabetic peripheral angiopathy without gangrene: Secondary | ICD-10-CM | POA: Diagnosis not present

## 2018-09-03 DIAGNOSIS — L97512 Non-pressure chronic ulcer of other part of right foot with fat layer exposed: Secondary | ICD-10-CM | POA: Diagnosis not present

## 2018-09-08 DIAGNOSIS — E1151 Type 2 diabetes mellitus with diabetic peripheral angiopathy without gangrene: Secondary | ICD-10-CM | POA: Diagnosis not present

## 2018-09-08 DIAGNOSIS — I70233 Atherosclerosis of native arteries of right leg with ulceration of ankle: Secondary | ICD-10-CM | POA: Diagnosis not present

## 2018-09-08 DIAGNOSIS — I87311 Chronic venous hypertension (idiopathic) with ulcer of right lower extremity: Secondary | ICD-10-CM | POA: Diagnosis not present

## 2018-09-08 DIAGNOSIS — I2510 Atherosclerotic heart disease of native coronary artery without angina pectoris: Secondary | ICD-10-CM | POA: Diagnosis not present

## 2018-09-08 DIAGNOSIS — I10 Essential (primary) hypertension: Secondary | ICD-10-CM | POA: Diagnosis not present

## 2018-09-08 DIAGNOSIS — L97312 Non-pressure chronic ulcer of right ankle with fat layer exposed: Secondary | ICD-10-CM | POA: Diagnosis not present

## 2018-09-10 DIAGNOSIS — Z794 Long term (current) use of insulin: Secondary | ICD-10-CM | POA: Diagnosis not present

## 2018-09-10 DIAGNOSIS — I10 Essential (primary) hypertension: Secondary | ICD-10-CM | POA: Diagnosis not present

## 2018-09-10 DIAGNOSIS — I87311 Chronic venous hypertension (idiopathic) with ulcer of right lower extremity: Secondary | ICD-10-CM | POA: Diagnosis not present

## 2018-09-10 DIAGNOSIS — L97512 Non-pressure chronic ulcer of other part of right foot with fat layer exposed: Secondary | ICD-10-CM | POA: Diagnosis not present

## 2018-09-10 DIAGNOSIS — E1151 Type 2 diabetes mellitus with diabetic peripheral angiopathy without gangrene: Secondary | ICD-10-CM | POA: Diagnosis not present

## 2018-09-11 DIAGNOSIS — E1151 Type 2 diabetes mellitus with diabetic peripheral angiopathy without gangrene: Secondary | ICD-10-CM | POA: Diagnosis not present

## 2018-09-11 DIAGNOSIS — I87311 Chronic venous hypertension (idiopathic) with ulcer of right lower extremity: Secondary | ICD-10-CM | POA: Diagnosis not present

## 2018-09-11 DIAGNOSIS — L97312 Non-pressure chronic ulcer of right ankle with fat layer exposed: Secondary | ICD-10-CM | POA: Diagnosis not present

## 2018-09-12 DIAGNOSIS — I10 Essential (primary) hypertension: Secondary | ICD-10-CM | POA: Diagnosis not present

## 2018-09-12 DIAGNOSIS — L89892 Pressure ulcer of other site, stage 2: Secondary | ICD-10-CM | POA: Diagnosis not present

## 2018-09-12 DIAGNOSIS — L97512 Non-pressure chronic ulcer of other part of right foot with fat layer exposed: Secondary | ICD-10-CM | POA: Diagnosis not present

## 2018-09-12 DIAGNOSIS — I87311 Chronic venous hypertension (idiopathic) with ulcer of right lower extremity: Secondary | ICD-10-CM | POA: Diagnosis not present

## 2018-09-12 DIAGNOSIS — Z794 Long term (current) use of insulin: Secondary | ICD-10-CM | POA: Diagnosis not present

## 2018-09-12 DIAGNOSIS — E1151 Type 2 diabetes mellitus with diabetic peripheral angiopathy without gangrene: Secondary | ICD-10-CM | POA: Diagnosis not present

## 2018-09-15 ENCOUNTER — Other Ambulatory Visit: Payer: Self-pay

## 2018-09-15 ENCOUNTER — Encounter: Payer: Self-pay | Admitting: Surgery

## 2018-09-15 ENCOUNTER — Ambulatory Visit (INDEPENDENT_AMBULATORY_CARE_PROVIDER_SITE_OTHER): Payer: Medicare HMO | Admitting: Surgery

## 2018-09-15 VITALS — BP 164/71 | HR 74 | Resp 18 | Ht 62.0 in | Wt 103.0 lb

## 2018-09-15 DIAGNOSIS — I10 Essential (primary) hypertension: Secondary | ICD-10-CM | POA: Diagnosis not present

## 2018-09-15 DIAGNOSIS — L97312 Non-pressure chronic ulcer of right ankle with fat layer exposed: Secondary | ICD-10-CM | POA: Diagnosis not present

## 2018-09-15 DIAGNOSIS — I70213 Atherosclerosis of native arteries of extremities with intermittent claudication, bilateral legs: Secondary | ICD-10-CM

## 2018-09-15 DIAGNOSIS — I87311 Chronic venous hypertension (idiopathic) with ulcer of right lower extremity: Secondary | ICD-10-CM | POA: Diagnosis not present

## 2018-09-15 DIAGNOSIS — E1151 Type 2 diabetes mellitus with diabetic peripheral angiopathy without gangrene: Secondary | ICD-10-CM | POA: Diagnosis not present

## 2018-09-15 DIAGNOSIS — I70233 Atherosclerosis of native arteries of right leg with ulceration of ankle: Secondary | ICD-10-CM | POA: Diagnosis not present

## 2018-09-15 DIAGNOSIS — I2510 Atherosclerotic heart disease of native coronary artery without angina pectoris: Secondary | ICD-10-CM | POA: Diagnosis not present

## 2018-09-15 NOTE — Progress Notes (Signed)
Patient name: [name] MRN: [id number] DOB: 12/14/1940 Sex: female  REASON FOR VISIT:     post op  HISTORY OF PRESENT ILLNESS:   Alicia Leys Bullinsis a 77 y.o.femalewho is a former patient of Dr. Kellie Simmering. She is status post aortobifemoral bypass graft on 12/21/2010 using a 12 x 7 graft. 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 On 02/29/2012 patient went back to the operating room and had a left femoral endarterectomy with patch angioplasty.  I saw her over the summer where she had developed a right medial ankle ulcer.  Angiography revealed an occluded right superficial femoral artery.  On 07/09/2018 she underwent a right femoral to above-knee popliteal artery bypass graft with reverse saphenous vein.  At her last visit she was complaining of low back pain and right leg pain with tingling from her toes to her knee.  She is also having some drainage from her incisions.  I opened the seroma from 1 of her vein harvest incisions.  Her wound appeared to be getting smaller.  She was referred to the wound center.  She is now back today for follow-up.  Her son states that she has good days and bad days as far as walking.  Sometimes she has trouble getting up.  Others she can go for long distances.  She is now getting wound care at the wound center.  CURRENT MEDICATIONS:    Current Outpatient Medications  Medication Sig Dispense Refill  . alendronate (FOSAMAX) 70 MG tablet Take 70 mg by mouth every Friday.  5  . amLODipine-benazepril (LOTREL) 5-40 MG capsule Take 1 capsule by mouth at bedtime.  5  . anastrozole (ARIMIDEX) 1 MG tablet Take 1 mg by mouth daily.    Marland Kitchen atenolol (TENORMIN) 100 MG tablet Take 100 mg by mouth daily.    Marland Kitchen atorvastatin (LIPITOR) 40 MG tablet Take 40 mg by mouth at bedtime.  5  . cilostazol (PLETAL) 100 MG tablet Take 1 tablet (100 mg total) by mouth 2 (two) times daily before a meal. 60 tablet 11  .  fenofibrate 160 MG tablet Take 160 mg by mouth daily.    Marland Kitchen gabapentin (NEURONTIN) 100 MG capsule Take 1 capsule (100 mg total) by mouth 2 (two) times daily. 1 capsule 10  . gabapentin (NEURONTIN) 100 MG capsule Take 1 capsule (100 mg total) by mouth 2 (two) times daily. 180 capsule 2  . Insulin Glargine (LANTUS SOLOSTAR) 100 UNIT/ML Solostar Pen Inject 20 Units into the skin daily at 10 pm.    . lansoprazole (PREVACID) 30 MG capsule Take 30 mg by mouth 2 (two) times daily.  5  . levothyroxine (SYNTHROID, LEVOTHROID) 112 MCG tablet Take 112 mcg by mouth daily before breakfast.     . losartan (COZAAR) 100 MG tablet Take 100 mg by mouth daily with breakfast.  0  . oxyCODONE-acetaminophen (PERCOCET/ROXICET) 5-325 MG tablet Take 1 tablet by mouth every 4 (four) hours as needed for severe pain. 20 tablet 0   No current facility-administered medications for this visit.     REVIEW OF SYSTEMS:   [X]  denotes positive finding, [ ]  denotes negative finding Cardiac  Comments:  Chest pain or chest pressure:    Shortness of breath upon exertion:    Short of breath when lying flat:    Irregular heart rhythm:    Constitutional    Fever or chills:      PHYSICAL EXAM:   There were no vitals filed for  this visit.  GENERAL: The patient is a well-nourished female, in no acute distress. The vital signs are documented above. CARDIOVASCULAR: There is a regular rate and rhythm. PULMONARY: Non-labored respirations Brisk Doppler signals in the leg.  All incisions are now healed.  Significant edema to both legs.  Persistent medial malleolus ulcer, relatively unchanged.  New open area on the right great toe from clipping her toenails Palpable femoral pulses  STUDIES:   None   MEDICAL ISSUES:   Bypass graft remains widely patent.  I think we need to help her with her edema.  Aside from leg elevation she is going to need compression.  Because of her ulcers she would most likely benefit from a Unna boot.  She  is seeing the wound center and so I will let them make that decision.  Also she is now on Lasix.  I think that she needs to see Dr. Olean Ree as she has not seen him postoperatively.  The family is concerned about all of medicine she is taking.  I have her scheduled to follow-up again in 6 weeks for wound check and bypass graft evaluation  [name], MD [facility] [phone] Pager [phone]

## 2018-09-16 DIAGNOSIS — E1151 Type 2 diabetes mellitus with diabetic peripheral angiopathy without gangrene: Secondary | ICD-10-CM | POA: Diagnosis not present

## 2018-09-16 DIAGNOSIS — L89892 Pressure ulcer of other site, stage 2: Secondary | ICD-10-CM | POA: Diagnosis not present

## 2018-09-17 DIAGNOSIS — Z1231 Encounter for screening mammogram for malignant neoplasm of breast: Secondary | ICD-10-CM | POA: Diagnosis not present

## 2018-09-17 DIAGNOSIS — E785 Hyperlipidemia, unspecified: Secondary | ICD-10-CM | POA: Diagnosis not present

## 2018-09-17 DIAGNOSIS — F419 Anxiety disorder, unspecified: Secondary | ICD-10-CM | POA: Diagnosis not present

## 2018-09-17 DIAGNOSIS — I87311 Chronic venous hypertension (idiopathic) with ulcer of right lower extremity: Secondary | ICD-10-CM | POA: Diagnosis not present

## 2018-09-17 DIAGNOSIS — Z794 Long term (current) use of insulin: Secondary | ICD-10-CM | POA: Diagnosis not present

## 2018-09-17 DIAGNOSIS — I10 Essential (primary) hypertension: Secondary | ICD-10-CM | POA: Diagnosis not present

## 2018-09-17 DIAGNOSIS — E039 Hypothyroidism, unspecified: Secondary | ICD-10-CM | POA: Diagnosis not present

## 2018-09-17 DIAGNOSIS — L97512 Non-pressure chronic ulcer of other part of right foot with fat layer exposed: Secondary | ICD-10-CM | POA: Diagnosis not present

## 2018-09-17 DIAGNOSIS — E1140 Type 2 diabetes mellitus with diabetic neuropathy, unspecified: Secondary | ICD-10-CM | POA: Diagnosis not present

## 2018-09-17 DIAGNOSIS — E1151 Type 2 diabetes mellitus with diabetic peripheral angiopathy without gangrene: Secondary | ICD-10-CM | POA: Diagnosis not present

## 2018-09-17 DIAGNOSIS — M8589 Other specified disorders of bone density and structure, multiple sites: Secondary | ICD-10-CM | POA: Diagnosis not present

## 2018-09-19 DIAGNOSIS — I87311 Chronic venous hypertension (idiopathic) with ulcer of right lower extremity: Secondary | ICD-10-CM | POA: Diagnosis not present

## 2018-09-19 DIAGNOSIS — L97512 Non-pressure chronic ulcer of other part of right foot with fat layer exposed: Secondary | ICD-10-CM | POA: Diagnosis not present

## 2018-09-19 DIAGNOSIS — I10 Essential (primary) hypertension: Secondary | ICD-10-CM | POA: Diagnosis not present

## 2018-09-19 DIAGNOSIS — E1151 Type 2 diabetes mellitus with diabetic peripheral angiopathy without gangrene: Secondary | ICD-10-CM | POA: Diagnosis not present

## 2018-09-19 DIAGNOSIS — Z794 Long term (current) use of insulin: Secondary | ICD-10-CM | POA: Diagnosis not present

## 2018-09-22 ENCOUNTER — Ambulatory Visit: Payer: Medicare HMO | Admitting: Surgery

## 2018-09-22 DIAGNOSIS — I70233 Atherosclerosis of native arteries of right leg with ulceration of ankle: Secondary | ICD-10-CM | POA: Diagnosis not present

## 2018-09-22 DIAGNOSIS — I87311 Chronic venous hypertension (idiopathic) with ulcer of right lower extremity: Secondary | ICD-10-CM | POA: Diagnosis not present

## 2018-09-22 DIAGNOSIS — I2510 Atherosclerotic heart disease of native coronary artery without angina pectoris: Secondary | ICD-10-CM | POA: Diagnosis not present

## 2018-09-22 DIAGNOSIS — L97312 Non-pressure chronic ulcer of right ankle with fat layer exposed: Secondary | ICD-10-CM | POA: Diagnosis not present

## 2018-09-22 DIAGNOSIS — E1151 Type 2 diabetes mellitus with diabetic peripheral angiopathy without gangrene: Secondary | ICD-10-CM | POA: Diagnosis not present

## 2018-09-22 DIAGNOSIS — I10 Essential (primary) hypertension: Secondary | ICD-10-CM | POA: Diagnosis not present

## 2018-09-24 DIAGNOSIS — Z794 Long term (current) use of insulin: Secondary | ICD-10-CM | POA: Diagnosis not present

## 2018-09-24 DIAGNOSIS — I87311 Chronic venous hypertension (idiopathic) with ulcer of right lower extremity: Secondary | ICD-10-CM | POA: Diagnosis not present

## 2018-09-24 DIAGNOSIS — E1151 Type 2 diabetes mellitus with diabetic peripheral angiopathy without gangrene: Secondary | ICD-10-CM | POA: Diagnosis not present

## 2018-09-24 DIAGNOSIS — I10 Essential (primary) hypertension: Secondary | ICD-10-CM | POA: Diagnosis not present

## 2018-09-24 DIAGNOSIS — L97512 Non-pressure chronic ulcer of other part of right foot with fat layer exposed: Secondary | ICD-10-CM | POA: Diagnosis not present

## 2018-09-25 DIAGNOSIS — E1151 Type 2 diabetes mellitus with diabetic peripheral angiopathy without gangrene: Secondary | ICD-10-CM | POA: Diagnosis not present

## 2018-09-25 DIAGNOSIS — L89892 Pressure ulcer of other site, stage 2: Secondary | ICD-10-CM | POA: Diagnosis not present

## 2018-09-25 DIAGNOSIS — L89891 Pressure ulcer of other site, stage 1: Secondary | ICD-10-CM | POA: Insufficient documentation

## 2018-09-26 DIAGNOSIS — Z794 Long term (current) use of insulin: Secondary | ICD-10-CM | POA: Diagnosis not present

## 2018-09-26 DIAGNOSIS — I10 Essential (primary) hypertension: Secondary | ICD-10-CM | POA: Diagnosis not present

## 2018-09-26 DIAGNOSIS — I87311 Chronic venous hypertension (idiopathic) with ulcer of right lower extremity: Secondary | ICD-10-CM | POA: Diagnosis not present

## 2018-09-26 DIAGNOSIS — E1151 Type 2 diabetes mellitus with diabetic peripheral angiopathy without gangrene: Secondary | ICD-10-CM | POA: Diagnosis not present

## 2018-09-26 DIAGNOSIS — L97512 Non-pressure chronic ulcer of other part of right foot with fat layer exposed: Secondary | ICD-10-CM | POA: Diagnosis not present

## 2018-09-29 DIAGNOSIS — I2510 Atherosclerotic heart disease of native coronary artery without angina pectoris: Secondary | ICD-10-CM | POA: Diagnosis not present

## 2018-09-29 DIAGNOSIS — I70233 Atherosclerosis of native arteries of right leg with ulceration of ankle: Secondary | ICD-10-CM | POA: Diagnosis not present

## 2018-09-29 DIAGNOSIS — I10 Essential (primary) hypertension: Secondary | ICD-10-CM | POA: Diagnosis not present

## 2018-09-29 DIAGNOSIS — E1151 Type 2 diabetes mellitus with diabetic peripheral angiopathy without gangrene: Secondary | ICD-10-CM | POA: Diagnosis not present

## 2018-09-29 DIAGNOSIS — I87311 Chronic venous hypertension (idiopathic) with ulcer of right lower extremity: Secondary | ICD-10-CM | POA: Diagnosis not present

## 2018-09-29 DIAGNOSIS — L97312 Non-pressure chronic ulcer of right ankle with fat layer exposed: Secondary | ICD-10-CM | POA: Diagnosis not present

## 2018-09-30 DIAGNOSIS — Z794 Long term (current) use of insulin: Secondary | ICD-10-CM | POA: Diagnosis not present

## 2018-09-30 DIAGNOSIS — E1151 Type 2 diabetes mellitus with diabetic peripheral angiopathy without gangrene: Secondary | ICD-10-CM | POA: Diagnosis not present

## 2018-09-30 DIAGNOSIS — L97512 Non-pressure chronic ulcer of other part of right foot with fat layer exposed: Secondary | ICD-10-CM | POA: Diagnosis not present

## 2018-09-30 DIAGNOSIS — I10 Essential (primary) hypertension: Secondary | ICD-10-CM | POA: Diagnosis not present

## 2018-09-30 DIAGNOSIS — I87311 Chronic venous hypertension (idiopathic) with ulcer of right lower extremity: Secondary | ICD-10-CM | POA: Diagnosis not present

## 2018-10-01 DIAGNOSIS — L97512 Non-pressure chronic ulcer of other part of right foot with fat layer exposed: Secondary | ICD-10-CM | POA: Diagnosis not present

## 2018-10-01 DIAGNOSIS — I10 Essential (primary) hypertension: Secondary | ICD-10-CM | POA: Diagnosis not present

## 2018-10-01 DIAGNOSIS — I87311 Chronic venous hypertension (idiopathic) with ulcer of right lower extremity: Secondary | ICD-10-CM | POA: Diagnosis not present

## 2018-10-01 DIAGNOSIS — Z794 Long term (current) use of insulin: Secondary | ICD-10-CM | POA: Diagnosis not present

## 2018-10-01 DIAGNOSIS — E1151 Type 2 diabetes mellitus with diabetic peripheral angiopathy without gangrene: Secondary | ICD-10-CM | POA: Diagnosis not present

## 2018-10-01 DIAGNOSIS — Z681 Body mass index (BMI) 19 or less, adult: Secondary | ICD-10-CM | POA: Diagnosis not present

## 2018-10-03 DIAGNOSIS — I10 Essential (primary) hypertension: Secondary | ICD-10-CM | POA: Diagnosis not present

## 2018-10-03 DIAGNOSIS — L97512 Non-pressure chronic ulcer of other part of right foot with fat layer exposed: Secondary | ICD-10-CM | POA: Diagnosis not present

## 2018-10-03 DIAGNOSIS — Z794 Long term (current) use of insulin: Secondary | ICD-10-CM | POA: Diagnosis not present

## 2018-10-03 DIAGNOSIS — I87311 Chronic venous hypertension (idiopathic) with ulcer of right lower extremity: Secondary | ICD-10-CM | POA: Diagnosis not present

## 2018-10-03 DIAGNOSIS — E1151 Type 2 diabetes mellitus with diabetic peripheral angiopathy without gangrene: Secondary | ICD-10-CM | POA: Diagnosis not present

## 2018-10-06 DIAGNOSIS — I2510 Atherosclerotic heart disease of native coronary artery without angina pectoris: Secondary | ICD-10-CM | POA: Diagnosis not present

## 2018-10-06 DIAGNOSIS — E1151 Type 2 diabetes mellitus with diabetic peripheral angiopathy without gangrene: Secondary | ICD-10-CM | POA: Diagnosis not present

## 2018-10-06 DIAGNOSIS — L97312 Non-pressure chronic ulcer of right ankle with fat layer exposed: Secondary | ICD-10-CM | POA: Diagnosis not present

## 2018-10-06 DIAGNOSIS — I87311 Chronic venous hypertension (idiopathic) with ulcer of right lower extremity: Secondary | ICD-10-CM | POA: Diagnosis not present

## 2018-10-06 DIAGNOSIS — I70233 Atherosclerosis of native arteries of right leg with ulceration of ankle: Secondary | ICD-10-CM | POA: Diagnosis not present

## 2018-10-07 DIAGNOSIS — I10 Essential (primary) hypertension: Secondary | ICD-10-CM | POA: Diagnosis not present

## 2018-10-07 DIAGNOSIS — L97512 Non-pressure chronic ulcer of other part of right foot with fat layer exposed: Secondary | ICD-10-CM | POA: Diagnosis not present

## 2018-10-07 DIAGNOSIS — Z794 Long term (current) use of insulin: Secondary | ICD-10-CM | POA: Diagnosis not present

## 2018-10-07 DIAGNOSIS — I87311 Chronic venous hypertension (idiopathic) with ulcer of right lower extremity: Secondary | ICD-10-CM | POA: Diagnosis not present

## 2018-10-07 DIAGNOSIS — E1151 Type 2 diabetes mellitus with diabetic peripheral angiopathy without gangrene: Secondary | ICD-10-CM | POA: Diagnosis not present

## 2018-10-08 DIAGNOSIS — I87311 Chronic venous hypertension (idiopathic) with ulcer of right lower extremity: Secondary | ICD-10-CM | POA: Diagnosis not present

## 2018-10-08 DIAGNOSIS — I10 Essential (primary) hypertension: Secondary | ICD-10-CM | POA: Diagnosis not present

## 2018-10-08 DIAGNOSIS — Z794 Long term (current) use of insulin: Secondary | ICD-10-CM | POA: Diagnosis not present

## 2018-10-08 DIAGNOSIS — L97512 Non-pressure chronic ulcer of other part of right foot with fat layer exposed: Secondary | ICD-10-CM | POA: Diagnosis not present

## 2018-10-08 DIAGNOSIS — E1151 Type 2 diabetes mellitus with diabetic peripheral angiopathy without gangrene: Secondary | ICD-10-CM | POA: Diagnosis not present

## 2018-10-10 DIAGNOSIS — M25571 Pain in right ankle and joints of right foot: Secondary | ICD-10-CM | POA: Diagnosis not present

## 2018-10-10 DIAGNOSIS — R600 Localized edema: Secondary | ICD-10-CM | POA: Diagnosis not present

## 2018-10-10 DIAGNOSIS — I10 Essential (primary) hypertension: Secondary | ICD-10-CM | POA: Diagnosis not present

## 2018-10-10 DIAGNOSIS — E1151 Type 2 diabetes mellitus with diabetic peripheral angiopathy without gangrene: Secondary | ICD-10-CM | POA: Diagnosis not present

## 2018-10-10 DIAGNOSIS — L03115 Cellulitis of right lower limb: Secondary | ICD-10-CM | POA: Diagnosis not present

## 2018-10-10 DIAGNOSIS — Z794 Long term (current) use of insulin: Secondary | ICD-10-CM | POA: Diagnosis not present

## 2018-10-10 DIAGNOSIS — E11621 Type 2 diabetes mellitus with foot ulcer: Secondary | ICD-10-CM | POA: Diagnosis not present

## 2018-10-10 DIAGNOSIS — L97512 Non-pressure chronic ulcer of other part of right foot with fat layer exposed: Secondary | ICD-10-CM | POA: Diagnosis not present

## 2018-10-10 DIAGNOSIS — L97319 Non-pressure chronic ulcer of right ankle with unspecified severity: Secondary | ICD-10-CM | POA: Diagnosis not present

## 2018-10-10 DIAGNOSIS — I87311 Chronic venous hypertension (idiopathic) with ulcer of right lower extremity: Secondary | ICD-10-CM | POA: Diagnosis not present

## 2018-10-13 DIAGNOSIS — I70233 Atherosclerosis of native arteries of right leg with ulceration of ankle: Secondary | ICD-10-CM | POA: Diagnosis not present

## 2018-10-13 DIAGNOSIS — E1151 Type 2 diabetes mellitus with diabetic peripheral angiopathy without gangrene: Secondary | ICD-10-CM | POA: Diagnosis not present

## 2018-10-13 DIAGNOSIS — I2510 Atherosclerotic heart disease of native coronary artery without angina pectoris: Secondary | ICD-10-CM | POA: Diagnosis not present

## 2018-10-13 DIAGNOSIS — I10 Essential (primary) hypertension: Secondary | ICD-10-CM | POA: Diagnosis not present

## 2018-10-13 DIAGNOSIS — L97312 Non-pressure chronic ulcer of right ankle with fat layer exposed: Secondary | ICD-10-CM | POA: Diagnosis not present

## 2018-10-13 DIAGNOSIS — I87311 Chronic venous hypertension (idiopathic) with ulcer of right lower extremity: Secondary | ICD-10-CM | POA: Diagnosis not present

## 2018-10-14 DIAGNOSIS — I10 Essential (primary) hypertension: Secondary | ICD-10-CM | POA: Diagnosis not present

## 2018-10-14 DIAGNOSIS — I87311 Chronic venous hypertension (idiopathic) with ulcer of right lower extremity: Secondary | ICD-10-CM | POA: Diagnosis not present

## 2018-10-14 DIAGNOSIS — Z794 Long term (current) use of insulin: Secondary | ICD-10-CM | POA: Diagnosis not present

## 2018-10-14 DIAGNOSIS — E1151 Type 2 diabetes mellitus with diabetic peripheral angiopathy without gangrene: Secondary | ICD-10-CM | POA: Diagnosis not present

## 2018-10-14 DIAGNOSIS — L97512 Non-pressure chronic ulcer of other part of right foot with fat layer exposed: Secondary | ICD-10-CM | POA: Diagnosis not present

## 2018-10-16 DIAGNOSIS — L89892 Pressure ulcer of other site, stage 2: Secondary | ICD-10-CM | POA: Diagnosis not present

## 2018-10-16 DIAGNOSIS — E1151 Type 2 diabetes mellitus with diabetic peripheral angiopathy without gangrene: Secondary | ICD-10-CM | POA: Diagnosis not present

## 2018-10-16 DIAGNOSIS — L89891 Pressure ulcer of other site, stage 1: Secondary | ICD-10-CM | POA: Diagnosis not present

## 2018-10-17 DIAGNOSIS — L97512 Non-pressure chronic ulcer of other part of right foot with fat layer exposed: Secondary | ICD-10-CM | POA: Diagnosis not present

## 2018-10-17 DIAGNOSIS — I10 Essential (primary) hypertension: Secondary | ICD-10-CM | POA: Diagnosis not present

## 2018-10-17 DIAGNOSIS — I87311 Chronic venous hypertension (idiopathic) with ulcer of right lower extremity: Secondary | ICD-10-CM | POA: Diagnosis not present

## 2018-10-17 DIAGNOSIS — Z794 Long term (current) use of insulin: Secondary | ICD-10-CM | POA: Diagnosis not present

## 2018-10-17 DIAGNOSIS — E1151 Type 2 diabetes mellitus with diabetic peripheral angiopathy without gangrene: Secondary | ICD-10-CM | POA: Diagnosis not present

## 2018-10-20 DIAGNOSIS — I10 Essential (primary) hypertension: Secondary | ICD-10-CM | POA: Diagnosis not present

## 2018-10-20 DIAGNOSIS — L97312 Non-pressure chronic ulcer of right ankle with fat layer exposed: Secondary | ICD-10-CM | POA: Diagnosis not present

## 2018-10-20 DIAGNOSIS — I70233 Atherosclerosis of native arteries of right leg with ulceration of ankle: Secondary | ICD-10-CM | POA: Diagnosis not present

## 2018-10-20 DIAGNOSIS — M86671 Other chronic osteomyelitis, right ankle and foot: Secondary | ICD-10-CM | POA: Diagnosis not present

## 2018-10-20 DIAGNOSIS — I2510 Atherosclerotic heart disease of native coronary artery without angina pectoris: Secondary | ICD-10-CM | POA: Diagnosis not present

## 2018-10-20 DIAGNOSIS — I87331 Chronic venous hypertension (idiopathic) with ulcer and inflammation of right lower extremity: Secondary | ICD-10-CM | POA: Diagnosis not present

## 2018-10-20 DIAGNOSIS — E1151 Type 2 diabetes mellitus with diabetic peripheral angiopathy without gangrene: Secondary | ICD-10-CM | POA: Diagnosis not present

## 2018-10-24 DIAGNOSIS — E1151 Type 2 diabetes mellitus with diabetic peripheral angiopathy without gangrene: Secondary | ICD-10-CM | POA: Diagnosis not present

## 2018-10-24 DIAGNOSIS — I87311 Chronic venous hypertension (idiopathic) with ulcer of right lower extremity: Secondary | ICD-10-CM | POA: Diagnosis not present

## 2018-10-24 DIAGNOSIS — Z794 Long term (current) use of insulin: Secondary | ICD-10-CM | POA: Diagnosis not present

## 2018-10-24 DIAGNOSIS — L97512 Non-pressure chronic ulcer of other part of right foot with fat layer exposed: Secondary | ICD-10-CM | POA: Diagnosis not present

## 2018-10-24 DIAGNOSIS — I10 Essential (primary) hypertension: Secondary | ICD-10-CM | POA: Diagnosis not present

## 2018-10-27 DIAGNOSIS — I10 Essential (primary) hypertension: Secondary | ICD-10-CM | POA: Diagnosis not present

## 2018-10-27 DIAGNOSIS — I87311 Chronic venous hypertension (idiopathic) with ulcer of right lower extremity: Secondary | ICD-10-CM | POA: Diagnosis not present

## 2018-10-27 DIAGNOSIS — I2510 Atherosclerotic heart disease of native coronary artery without angina pectoris: Secondary | ICD-10-CM | POA: Diagnosis not present

## 2018-10-27 DIAGNOSIS — L97312 Non-pressure chronic ulcer of right ankle with fat layer exposed: Secondary | ICD-10-CM | POA: Diagnosis not present

## 2018-10-27 DIAGNOSIS — I70233 Atherosclerosis of native arteries of right leg with ulceration of ankle: Secondary | ICD-10-CM | POA: Diagnosis not present

## 2018-10-27 DIAGNOSIS — E1151 Type 2 diabetes mellitus with diabetic peripheral angiopathy without gangrene: Secondary | ICD-10-CM | POA: Diagnosis not present

## 2018-10-27 DIAGNOSIS — E11622 Type 2 diabetes mellitus with other skin ulcer: Secondary | ICD-10-CM | POA: Diagnosis not present

## 2018-10-29 DIAGNOSIS — I10 Essential (primary) hypertension: Secondary | ICD-10-CM | POA: Diagnosis not present

## 2018-10-29 DIAGNOSIS — Z794 Long term (current) use of insulin: Secondary | ICD-10-CM | POA: Diagnosis not present

## 2018-10-29 DIAGNOSIS — E1151 Type 2 diabetes mellitus with diabetic peripheral angiopathy without gangrene: Secondary | ICD-10-CM | POA: Diagnosis not present

## 2018-10-29 DIAGNOSIS — L97512 Non-pressure chronic ulcer of other part of right foot with fat layer exposed: Secondary | ICD-10-CM | POA: Diagnosis not present

## 2018-10-29 DIAGNOSIS — I87311 Chronic venous hypertension (idiopathic) with ulcer of right lower extremity: Secondary | ICD-10-CM | POA: Diagnosis not present

## 2018-10-31 DIAGNOSIS — M810 Age-related osteoporosis without current pathological fracture: Secondary | ICD-10-CM | POA: Diagnosis not present

## 2018-10-31 DIAGNOSIS — M8588 Other specified disorders of bone density and structure, other site: Secondary | ICD-10-CM | POA: Diagnosis not present

## 2018-10-31 DIAGNOSIS — I87311 Chronic venous hypertension (idiopathic) with ulcer of right lower extremity: Secondary | ICD-10-CM | POA: Diagnosis not present

## 2018-10-31 DIAGNOSIS — Z794 Long term (current) use of insulin: Secondary | ICD-10-CM | POA: Diagnosis not present

## 2018-10-31 DIAGNOSIS — I10 Essential (primary) hypertension: Secondary | ICD-10-CM | POA: Diagnosis not present

## 2018-10-31 DIAGNOSIS — Z1231 Encounter for screening mammogram for malignant neoplasm of breast: Secondary | ICD-10-CM | POA: Diagnosis not present

## 2018-10-31 DIAGNOSIS — E1151 Type 2 diabetes mellitus with diabetic peripheral angiopathy without gangrene: Secondary | ICD-10-CM | POA: Diagnosis not present

## 2018-10-31 DIAGNOSIS — M8589 Other specified disorders of bone density and structure, multiple sites: Secondary | ICD-10-CM | POA: Diagnosis not present

## 2018-10-31 DIAGNOSIS — L97512 Non-pressure chronic ulcer of other part of right foot with fat layer exposed: Secondary | ICD-10-CM | POA: Diagnosis not present

## 2018-11-03 ENCOUNTER — Other Ambulatory Visit: Payer: Self-pay

## 2018-11-03 DIAGNOSIS — L97311 Non-pressure chronic ulcer of right ankle limited to breakdown of skin: Secondary | ICD-10-CM

## 2018-11-03 DIAGNOSIS — M79673 Pain in unspecified foot: Secondary | ICD-10-CM

## 2018-11-03 DIAGNOSIS — I70213 Atherosclerosis of native arteries of extremities with intermittent claudication, bilateral legs: Secondary | ICD-10-CM

## 2018-11-03 DIAGNOSIS — E1151 Type 2 diabetes mellitus with diabetic peripheral angiopathy without gangrene: Secondary | ICD-10-CM | POA: Diagnosis not present

## 2018-11-03 DIAGNOSIS — I87311 Chronic venous hypertension (idiopathic) with ulcer of right lower extremity: Secondary | ICD-10-CM | POA: Diagnosis not present

## 2018-11-03 DIAGNOSIS — L97312 Non-pressure chronic ulcer of right ankle with fat layer exposed: Secondary | ICD-10-CM | POA: Diagnosis not present

## 2018-11-03 DIAGNOSIS — I70233 Atherosclerosis of native arteries of right leg with ulceration of ankle: Secondary | ICD-10-CM | POA: Diagnosis not present

## 2018-11-03 DIAGNOSIS — E11622 Type 2 diabetes mellitus with other skin ulcer: Secondary | ICD-10-CM | POA: Diagnosis not present

## 2018-11-05 DIAGNOSIS — Z794 Long term (current) use of insulin: Secondary | ICD-10-CM | POA: Diagnosis not present

## 2018-11-05 DIAGNOSIS — I10 Essential (primary) hypertension: Secondary | ICD-10-CM | POA: Diagnosis not present

## 2018-11-05 DIAGNOSIS — E1151 Type 2 diabetes mellitus with diabetic peripheral angiopathy without gangrene: Secondary | ICD-10-CM | POA: Diagnosis not present

## 2018-11-05 DIAGNOSIS — L97512 Non-pressure chronic ulcer of other part of right foot with fat layer exposed: Secondary | ICD-10-CM | POA: Diagnosis not present

## 2018-11-05 DIAGNOSIS — I87311 Chronic venous hypertension (idiopathic) with ulcer of right lower extremity: Secondary | ICD-10-CM | POA: Diagnosis not present

## 2018-11-07 DIAGNOSIS — M810 Age-related osteoporosis without current pathological fracture: Secondary | ICD-10-CM | POA: Diagnosis not present

## 2018-11-07 DIAGNOSIS — Z79899 Other long term (current) drug therapy: Secondary | ICD-10-CM | POA: Diagnosis not present

## 2018-11-07 DIAGNOSIS — L97512 Non-pressure chronic ulcer of other part of right foot with fat layer exposed: Secondary | ICD-10-CM | POA: Diagnosis not present

## 2018-11-07 DIAGNOSIS — I10 Essential (primary) hypertension: Secondary | ICD-10-CM | POA: Diagnosis not present

## 2018-11-07 DIAGNOSIS — E1151 Type 2 diabetes mellitus with diabetic peripheral angiopathy without gangrene: Secondary | ICD-10-CM | POA: Diagnosis not present

## 2018-11-07 DIAGNOSIS — Z794 Long term (current) use of insulin: Secondary | ICD-10-CM | POA: Diagnosis not present

## 2018-11-07 DIAGNOSIS — Z79811 Long term (current) use of aromatase inhibitors: Secondary | ICD-10-CM | POA: Diagnosis not present

## 2018-11-07 DIAGNOSIS — M8589 Other specified disorders of bone density and structure, multiple sites: Secondary | ICD-10-CM | POA: Diagnosis not present

## 2018-11-07 DIAGNOSIS — I87311 Chronic venous hypertension (idiopathic) with ulcer of right lower extremity: Secondary | ICD-10-CM | POA: Diagnosis not present

## 2018-11-07 DIAGNOSIS — Z853 Personal history of malignant neoplasm of breast: Secondary | ICD-10-CM | POA: Diagnosis not present

## 2018-11-10 ENCOUNTER — Ambulatory Visit: Payer: Medicare HMO | Admitting: Surgery

## 2018-11-10 ENCOUNTER — Encounter: Payer: Self-pay | Admitting: Surgery

## 2018-11-10 ENCOUNTER — Other Ambulatory Visit: Payer: Self-pay

## 2018-11-10 ENCOUNTER — Ambulatory Visit (INDEPENDENT_AMBULATORY_CARE_PROVIDER_SITE_OTHER)
Admission: RE | Admit: 2018-11-10 | Discharge: 2018-11-10 | Disposition: A | Discharge status: Home / Self Care | Payer: Medicare HMO | Source: Ambulatory Visit | Attending: Surgery | Admitting: Surgery

## 2018-11-10 ENCOUNTER — Ambulatory Visit (HOSPITAL_COMMUNITY)
Admission: RE | Admit: 2018-11-10 | Discharge: 2018-11-10 | Disposition: A | Discharge status: Home / Self Care | Payer: Medicare HMO | Source: Ambulatory Visit | Attending: Surgery | Admitting: Surgery

## 2018-11-10 VITALS — BP 168/71 | HR 64 | Temp 97.8°F | Resp 16 | Ht 62.0 in | Wt 121.0 lb

## 2018-11-10 DIAGNOSIS — L97311 Non-pressure chronic ulcer of right ankle limited to breakdown of skin: Secondary | ICD-10-CM

## 2018-11-10 DIAGNOSIS — I70213 Atherosclerosis of native arteries of extremities with intermittent claudication, bilateral legs: Secondary | ICD-10-CM | POA: Insufficient documentation

## 2018-11-10 DIAGNOSIS — M79673 Pain in unspecified foot: Secondary | ICD-10-CM | POA: Diagnosis not present

## 2018-11-10 NOTE — Progress Notes (Signed)
Vascular and Vein Specialist of [place]  Patient name: TANASHA MENEES MRN: [id number] DOB: 01/11/1941 Sex: female   REASON FOR VISIT:    Follow up  HISOTRY OF PRESENT ILLNESS:    Ahmani Daoud Bullinsis a 78y.o.femalewho is a former patient of Dr. Kellie Simmering. She is status post aortobifemoral bypass graft on 12/21/2010 using a 12 x 7 graft. 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 On 02/29/2012 patient went back to the operating room and had a left femoral endarterectomy with patch angioplasty. I saw her over the summer where she had developed a right medial ankle ulcer. Angiography revealed an occluded right superficial femoral artery. On 07/09/2018 she underwent a right femoral to above-knee popliteal artery bypass graft with reverse saphenous vein.  At her last visit she was complaining of low back pain and right leg pain with tingling from her toes to her knee.  She is also having some drainage from her incisions.  I opened the seroma from 1 of her vein harvest incisions.  Her wound appeared to be getting smaller.  She was referred to the wound center.    She has now developed osteomyelitis with a small residual wound on her left medial malleolus.  She is considering hyperbaric therapy.  She continues to have significant swelling in both legs and now into her right arm.  She has not been wearing compression.  PAST MEDICAL HISTORY:   Past Medical History:  Diagnosis Date  . AAA (abdominal aortic aneurysm) ([place]) 02/26/2012  . Arthritis   . 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 Blood transfusion 2011  . Cancer (HCC)    BREAST  . Claudication of both lower extremities ([place]) 05/28/2017  . COPD (chronic obstructive pulmonary disease) ([place])   . Depression   . Diabetes mellitus    type 2 IDDM x 10 years  . Diabetes mellitus with peripheral vascular  disease ([place]) 05/28/2017  . GERD (gastroesophageal reflux disease)   . Hip pain   . Hypertension   . Hypothyroidism   . Iliac artery occlusion ([place]) 02/26/2012  . Myocardial infarction [facility])    unsure, looks as if she may have had one in the past  . Peripheral vascular disease ([place])   . PVD (peripheral vascular disease) with claudication ([place]) 02/26/2012  . Sleep apnea    does not use cpap   . Type II or unspecified type diabetes mellitus with peripheral circulatory disorders, uncontrolled(250.72) 06/15/2014     FAMILY HISTORY:   Family History  Problem Relation Age of Onset  . Heart disease Mother   . Cancer Father        LUNG  . Cancer Sister        BONE  . Anesthesia problems Neg Hx     SOCIAL HISTORY:   Social History   Tobacco Use  . Smoking status: Former Smoker    Packs/day: 1.00    Years: 50.00    Pack years: 50.00    Types: Cigarettes    Last attempt to quit: 04/2018    Years since quitting: 0.5  . Smokeless tobacco: Never Used  Substance Use Topics  . Alcohol use: Not Currently    Comment: "up to three weeks ago" 07/04/18     ALLERGIES:   Allergies  Allergen Reactions  . Penicillins Swelling and Rash    Has patient had a PCN reaction causing immediate rash, facial/tongue/throat swelling, SOB or lightheadedness with hypotension:  No Has patient had a PCN reaction causing severe rash involving mucus membranes or skin necrosis: No Has patient had a PCN reaction that required hospitalization: No Has patient had a PCN reaction occurring within the last 10 years:No If all of the above answers are "NO", then may proceed with Cephalosporin use.      CURRENT MEDICATIONS:   Current Outpatient Medications  Medication Sig Dispense Refill  . amLODipine-benazepril (LOTREL) 5-40 MG capsule Take 1 capsule by mouth at bedtime.  5  . atenolol (TENORMIN) 100 MG tablet Take 100 mg by mouth daily.    Marland Kitchen atorvastatin (LIPITOR) 40 MG tablet Take 40 mg by mouth at bedtime.   5  . fenofibrate 160 MG tablet Take 160 mg by mouth daily.    Marland Kitchen gabapentin (NEURONTIN) 100 MG capsule Take 1 capsule (100 mg total) by mouth 2 (two) times daily. 1 capsule 10  . Insulin Glargine (LANTUS SOLOSTAR) 100 UNIT/ML Solostar Pen Inject 20 Units into the skin daily at 10 pm.    . lansoprazole (PREVACID) 30 MG capsule Take 30 mg by mouth 2 (two) times daily.  5  . levothyroxine (SYNTHROID, LEVOTHROID) 112 MCG tablet Take 112 mcg by mouth daily before breakfast.     . losartan (COZAAR) 100 MG tablet Take 100 mg by mouth daily with breakfast.  0   No current facility-administered medications for this visit.     REVIEW OF SYSTEMS:   [X]  denotes positive finding, [ ]  denotes negative finding Cardiac  Comments:  Chest pain or chest pressure:    Shortness of breath upon exertion:    Short of breath when lying flat:    Irregular heart rhythm:        Vascular    Pain in calf, thigh, or hip brought on by ambulation:    Pain in feet at night that wakes you up from your sleep:     Blood clot in your veins:    Leg swelling:  x       Pulmonary    Oxygen at home:    Productive cough:     Wheezing:         Neurologic    Sudden weakness in arms or legs:     Sudden numbness in arms or legs:     Sudden onset of difficulty speaking or slurred speech:    Temporary loss of vision in one eye:     Problems with dizziness:         Gastrointestinal    Blood in stool:     Vomited blood:         Genitourinary    Burning when urinating:     Blood in urine:        Psychiatric    Major depression:         Hematologic    Bleeding problems:    Problems with blood clotting too easily:        Skin    Rashes or ulcers:        Constitutional    Fever or chills:      PHYSICAL EXAM:   Vitals:   11/10/18 1527 11/10/18 1531  BP: (!) 199/76 (!) 168/71  Pulse: 64   Resp: 16   Temp: 97.8 F (36.6 C)   TempSrc: Oral   SpO2: 98%   Weight: 121 lb (54.9 kg)   Height: 5\' 2"  (1.575 m)       GENERAL: The patient is a well-nourished female, in  no acute distress. The vital signs are documented above. CARDIAC: There is a regular rate and rhythm.  VASCULAR: 2+ pitting edema bilaterally pedal pulses are nonpalpable PULMONARY: Non-labored respirations MUSCULOSKELETAL: There are no major deformities or cyanosis. NEUROLOGIC: No focal weakness or paresthesias are detected. SKIN: Small superficial left medial malleolus ulcer pSYCHIATRIC: The patient has a normal affect.  STUDIES:    I have reviewed the following: ABI/TBIToday's ABIToday's TBIPrevious ABIPrevious TBI +-------+-----------+-----------+------------+------------+ Right  1.20       0.78       0.67 ([facility])  0.15 [facility])   +-------+-----------+-----------+------------+------------+ Left   0.83       0.66       0.47 ([facility])  0.00 [facility])   +-------+-----------+-----------+------------+------------+ Right toe pressure is 149.  Left toe pressure is 126.   Right: 30-49% stenosis noted in the deep femoral artery.  Right Graft(s): Femoral-below knee popliteal artery bypass graft patent with no evidence of stenosis noted. Large non-vascular fluid collection noted in the groin measuring 3.78 cm x 3.00 cm. Graft diameter noted to be of small caliber in the mid-distal  thigh, not well identified.  MEDICAL ISSUES:   Ultrasound today has confirmed that her bypass grafts remain widely patent.  I am still concerned about her bilateral edema and now edema in her right arm.  Unfortunately, she is not been in compression.  I have encouraged her to wear a compression stocking on her left leg and then have the wound center consider placing a Unna boot on the right leg.  I am concerned about other potential etiologies causing her swelling such as worsening renal insufficiency or potentially underlying cardiac issues.  I do not have access to all of her labs.  I will defer to Dr. Delena Bali, whom she is seeing February 13 to help  with these matters.  The patient will follow-up with me in 3 months for a repeat arterial evaluation.      [name], MD Vascular and Vein Specialists of [facility] [phone] Pager [phone]

## 2018-11-11 DIAGNOSIS — I2510 Atherosclerotic heart disease of native coronary artery without angina pectoris: Secondary | ICD-10-CM | POA: Diagnosis not present

## 2018-11-11 DIAGNOSIS — I70233 Atherosclerosis of native arteries of right leg with ulceration of ankle: Secondary | ICD-10-CM | POA: Diagnosis not present

## 2018-11-11 DIAGNOSIS — I10 Essential (primary) hypertension: Secondary | ICD-10-CM | POA: Diagnosis not present

## 2018-11-11 DIAGNOSIS — E1151 Type 2 diabetes mellitus with diabetic peripheral angiopathy without gangrene: Secondary | ICD-10-CM | POA: Diagnosis not present

## 2018-11-11 DIAGNOSIS — L97312 Non-pressure chronic ulcer of right ankle with fat layer exposed: Secondary | ICD-10-CM | POA: Diagnosis not present

## 2018-11-11 DIAGNOSIS — I87311 Chronic venous hypertension (idiopathic) with ulcer of right lower extremity: Secondary | ICD-10-CM | POA: Diagnosis not present

## 2018-11-12 DIAGNOSIS — E1151 Type 2 diabetes mellitus with diabetic peripheral angiopathy without gangrene: Secondary | ICD-10-CM | POA: Diagnosis not present

## 2018-11-12 DIAGNOSIS — I87311 Chronic venous hypertension (idiopathic) with ulcer of right lower extremity: Secondary | ICD-10-CM | POA: Diagnosis not present

## 2018-11-12 DIAGNOSIS — L97512 Non-pressure chronic ulcer of other part of right foot with fat layer exposed: Secondary | ICD-10-CM | POA: Diagnosis not present

## 2018-11-12 DIAGNOSIS — I10 Essential (primary) hypertension: Secondary | ICD-10-CM | POA: Diagnosis not present

## 2018-11-12 DIAGNOSIS — Z794 Long term (current) use of insulin: Secondary | ICD-10-CM | POA: Diagnosis not present

## 2018-11-17 DIAGNOSIS — E1152 Type 2 diabetes mellitus with diabetic peripheral angiopathy with gangrene: Secondary | ICD-10-CM | POA: Diagnosis not present

## 2018-11-17 DIAGNOSIS — L97312 Non-pressure chronic ulcer of right ankle with fat layer exposed: Secondary | ICD-10-CM | POA: Diagnosis not present

## 2018-11-17 DIAGNOSIS — I70233 Atherosclerosis of native arteries of right leg with ulceration of ankle: Secondary | ICD-10-CM | POA: Diagnosis not present

## 2018-11-17 DIAGNOSIS — I10 Essential (primary) hypertension: Secondary | ICD-10-CM | POA: Diagnosis not present

## 2018-11-17 DIAGNOSIS — I87311 Chronic venous hypertension (idiopathic) with ulcer of right lower extremity: Secondary | ICD-10-CM | POA: Diagnosis not present

## 2018-11-17 DIAGNOSIS — I2510 Atherosclerotic heart disease of native coronary artery without angina pectoris: Secondary | ICD-10-CM | POA: Diagnosis not present

## 2018-11-19 DIAGNOSIS — L97512 Non-pressure chronic ulcer of other part of right foot with fat layer exposed: Secondary | ICD-10-CM | POA: Diagnosis not present

## 2018-11-19 DIAGNOSIS — I10 Essential (primary) hypertension: Secondary | ICD-10-CM | POA: Diagnosis not present

## 2018-11-19 DIAGNOSIS — E1151 Type 2 diabetes mellitus with diabetic peripheral angiopathy without gangrene: Secondary | ICD-10-CM | POA: Diagnosis not present

## 2018-11-19 DIAGNOSIS — Z794 Long term (current) use of insulin: Secondary | ICD-10-CM | POA: Diagnosis not present

## 2018-11-19 DIAGNOSIS — R2681 Unsteadiness on feet: Secondary | ICD-10-CM | POA: Diagnosis not present

## 2018-11-19 DIAGNOSIS — I87311 Chronic venous hypertension (idiopathic) with ulcer of right lower extremity: Secondary | ICD-10-CM | POA: Diagnosis not present

## 2018-11-20 DIAGNOSIS — E039 Hypothyroidism, unspecified: Secondary | ICD-10-CM | POA: Diagnosis not present

## 2018-11-20 DIAGNOSIS — L03115 Cellulitis of right lower limb: Secondary | ICD-10-CM | POA: Diagnosis not present

## 2018-11-20 DIAGNOSIS — R600 Localized edema: Secondary | ICD-10-CM | POA: Diagnosis not present

## 2018-11-21 DIAGNOSIS — R2681 Unsteadiness on feet: Secondary | ICD-10-CM | POA: Diagnosis not present

## 2018-11-21 DIAGNOSIS — I87311 Chronic venous hypertension (idiopathic) with ulcer of right lower extremity: Secondary | ICD-10-CM | POA: Diagnosis not present

## 2018-11-21 DIAGNOSIS — L97512 Non-pressure chronic ulcer of other part of right foot with fat layer exposed: Secondary | ICD-10-CM | POA: Diagnosis not present

## 2018-11-21 DIAGNOSIS — Z794 Long term (current) use of insulin: Secondary | ICD-10-CM | POA: Diagnosis not present

## 2018-11-21 DIAGNOSIS — I10 Essential (primary) hypertension: Secondary | ICD-10-CM | POA: Diagnosis not present

## 2018-11-21 DIAGNOSIS — E1151 Type 2 diabetes mellitus with diabetic peripheral angiopathy without gangrene: Secondary | ICD-10-CM | POA: Diagnosis not present

## 2018-11-24 DIAGNOSIS — I87311 Chronic venous hypertension (idiopathic) with ulcer of right lower extremity: Secondary | ICD-10-CM | POA: Diagnosis not present

## 2018-11-24 DIAGNOSIS — I70233 Atherosclerosis of native arteries of right leg with ulceration of ankle: Secondary | ICD-10-CM | POA: Diagnosis not present

## 2018-11-24 DIAGNOSIS — J9811 Atelectasis: Secondary | ICD-10-CM | POA: Diagnosis not present

## 2018-11-24 DIAGNOSIS — L97312 Non-pressure chronic ulcer of right ankle with fat layer exposed: Secondary | ICD-10-CM | POA: Diagnosis not present

## 2018-11-24 DIAGNOSIS — J90 Pleural effusion, not elsewhere classified: Secondary | ICD-10-CM | POA: Diagnosis not present

## 2018-11-24 DIAGNOSIS — E11622 Type 2 diabetes mellitus with other skin ulcer: Secondary | ICD-10-CM | POA: Diagnosis not present

## 2018-11-24 DIAGNOSIS — I10 Essential (primary) hypertension: Secondary | ICD-10-CM | POA: Diagnosis not present

## 2018-11-25 ENCOUNTER — Telehealth: Payer: Self-pay | Admitting: *Deleted

## 2018-11-25 NOTE — Telephone Encounter (Signed)
Faxed EKG from Last visit with Dr. Bettina Gavia to [facility]

## 2018-11-26 DIAGNOSIS — E1151 Type 2 diabetes mellitus with diabetic peripheral angiopathy without gangrene: Secondary | ICD-10-CM | POA: Diagnosis not present

## 2018-11-26 DIAGNOSIS — I87311 Chronic venous hypertension (idiopathic) with ulcer of right lower extremity: Secondary | ICD-10-CM | POA: Diagnosis not present

## 2018-11-26 DIAGNOSIS — I10 Essential (primary) hypertension: Secondary | ICD-10-CM | POA: Diagnosis not present

## 2018-11-26 DIAGNOSIS — L97512 Non-pressure chronic ulcer of other part of right foot with fat layer exposed: Secondary | ICD-10-CM | POA: Diagnosis not present

## 2018-11-26 DIAGNOSIS — Z794 Long term (current) use of insulin: Secondary | ICD-10-CM | POA: Diagnosis not present

## 2018-11-26 DIAGNOSIS — R2681 Unsteadiness on feet: Secondary | ICD-10-CM | POA: Diagnosis not present

## 2018-11-28 DIAGNOSIS — E1151 Type 2 diabetes mellitus with diabetic peripheral angiopathy without gangrene: Secondary | ICD-10-CM | POA: Diagnosis not present

## 2018-11-28 DIAGNOSIS — L97512 Non-pressure chronic ulcer of other part of right foot with fat layer exposed: Secondary | ICD-10-CM | POA: Diagnosis not present

## 2018-11-28 DIAGNOSIS — I87311 Chronic venous hypertension (idiopathic) with ulcer of right lower extremity: Secondary | ICD-10-CM | POA: Diagnosis not present

## 2018-11-28 DIAGNOSIS — Z794 Long term (current) use of insulin: Secondary | ICD-10-CM | POA: Diagnosis not present

## 2018-11-28 DIAGNOSIS — R2681 Unsteadiness on feet: Secondary | ICD-10-CM | POA: Diagnosis not present

## 2018-11-28 DIAGNOSIS — I10 Essential (primary) hypertension: Secondary | ICD-10-CM | POA: Diagnosis not present

## 2018-12-01 DIAGNOSIS — I87311 Chronic venous hypertension (idiopathic) with ulcer of right lower extremity: Secondary | ICD-10-CM | POA: Diagnosis not present

## 2018-12-01 DIAGNOSIS — L97312 Non-pressure chronic ulcer of right ankle with fat layer exposed: Secondary | ICD-10-CM | POA: Diagnosis not present

## 2018-12-01 DIAGNOSIS — I10 Essential (primary) hypertension: Secondary | ICD-10-CM | POA: Diagnosis not present

## 2018-12-01 DIAGNOSIS — I96 Gangrene, not elsewhere classified: Secondary | ICD-10-CM | POA: Diagnosis not present

## 2018-12-01 DIAGNOSIS — I2510 Atherosclerotic heart disease of native coronary artery without angina pectoris: Secondary | ICD-10-CM | POA: Diagnosis not present

## 2018-12-01 DIAGNOSIS — I70233 Atherosclerosis of native arteries of right leg with ulceration of ankle: Secondary | ICD-10-CM | POA: Diagnosis not present

## 2018-12-01 DIAGNOSIS — E11622 Type 2 diabetes mellitus with other skin ulcer: Secondary | ICD-10-CM | POA: Diagnosis not present

## 2018-12-01 DIAGNOSIS — M86671 Other chronic osteomyelitis, right ankle and foot: Secondary | ICD-10-CM | POA: Diagnosis not present

## 2018-12-01 DIAGNOSIS — E1152 Type 2 diabetes mellitus with diabetic peripheral angiopathy with gangrene: Secondary | ICD-10-CM | POA: Diagnosis not present

## 2018-12-02 ENCOUNTER — Encounter: Payer: Self-pay | Admitting: Internal Medicine

## 2018-12-02 DIAGNOSIS — I340 Nonrheumatic mitral (valve) insufficiency: Secondary | ICD-10-CM | POA: Diagnosis not present

## 2018-12-02 DIAGNOSIS — E039 Hypothyroidism, unspecified: Secondary | ICD-10-CM | POA: Diagnosis not present

## 2018-12-02 DIAGNOSIS — S299XXA Unspecified injury of thorax, initial encounter: Secondary | ICD-10-CM | POA: Diagnosis not present

## 2018-12-02 DIAGNOSIS — Z471 Aftercare following joint replacement surgery: Secondary | ICD-10-CM | POA: Diagnosis not present

## 2018-12-02 DIAGNOSIS — S72001D Fracture of unspecified part of neck of right femur, subsequent encounter for closed fracture with routine healing: Secondary | ICD-10-CM | POA: Diagnosis not present

## 2018-12-02 DIAGNOSIS — K219 Gastro-esophageal reflux disease without esophagitis: Secondary | ICD-10-CM | POA: Diagnosis not present

## 2018-12-02 DIAGNOSIS — R531 Weakness: Secondary | ICD-10-CM | POA: Diagnosis not present

## 2018-12-02 DIAGNOSIS — E785 Hyperlipidemia, unspecified: Secondary | ICD-10-CM | POA: Diagnosis not present

## 2018-12-02 DIAGNOSIS — Z96641 Presence of right artificial hip joint: Secondary | ICD-10-CM | POA: Diagnosis not present

## 2018-12-02 DIAGNOSIS — R4182 Altered mental status, unspecified: Secondary | ICD-10-CM | POA: Diagnosis not present

## 2018-12-02 DIAGNOSIS — L97919 Non-pressure chronic ulcer of unspecified part of right lower leg with unspecified severity: Secondary | ICD-10-CM | POA: Diagnosis not present

## 2018-12-02 DIAGNOSIS — F418 Other specified anxiety disorders: Secondary | ICD-10-CM | POA: Diagnosis not present

## 2018-12-02 DIAGNOSIS — Z87891 Personal history of nicotine dependence: Secondary | ICD-10-CM | POA: Diagnosis not present

## 2018-12-02 DIAGNOSIS — R740 Nonspecific elevation of levels of transaminase and lactic acid dehydrogenase [LDH]: Secondary | ICD-10-CM | POA: Diagnosis not present

## 2018-12-02 DIAGNOSIS — D500 Iron deficiency anemia secondary to blood loss (chronic): Secondary | ICD-10-CM | POA: Diagnosis not present

## 2018-12-02 DIAGNOSIS — G92 Toxic encephalopathy: Secondary | ICD-10-CM | POA: Diagnosis not present

## 2018-12-02 DIAGNOSIS — E11621 Type 2 diabetes mellitus with foot ulcer: Secondary | ICD-10-CM | POA: Diagnosis not present

## 2018-12-02 DIAGNOSIS — D62 Acute posthemorrhagic anemia: Secondary | ICD-10-CM | POA: Diagnosis not present

## 2018-12-02 DIAGNOSIS — N183 Chronic kidney disease, stage 3 (moderate): Secondary | ICD-10-CM | POA: Diagnosis not present

## 2018-12-02 DIAGNOSIS — E871 Hypo-osmolality and hyponatremia: Secondary | ICD-10-CM | POA: Diagnosis not present

## 2018-12-02 DIAGNOSIS — G9341 Metabolic encephalopathy: Secondary | ICD-10-CM | POA: Diagnosis not present

## 2018-12-02 DIAGNOSIS — I739 Peripheral vascular disease, unspecified: Secondary | ICD-10-CM | POA: Diagnosis not present

## 2018-12-02 DIAGNOSIS — I129 Hypertensive chronic kidney disease with stage 1 through stage 4 chronic kidney disease, or unspecified chronic kidney disease: Secondary | ICD-10-CM | POA: Diagnosis not present

## 2018-12-02 DIAGNOSIS — M79661 Pain in right lower leg: Secondary | ICD-10-CM | POA: Diagnosis not present

## 2018-12-02 DIAGNOSIS — R410 Disorientation, unspecified: Secondary | ICD-10-CM | POA: Diagnosis not present

## 2018-12-02 DIAGNOSIS — E440 Moderate protein-calorie malnutrition: Secondary | ICD-10-CM | POA: Diagnosis not present

## 2018-12-02 DIAGNOSIS — M79662 Pain in left lower leg: Secondary | ICD-10-CM | POA: Diagnosis not present

## 2018-12-02 DIAGNOSIS — S72001A Fracture of unspecified part of neck of right femur, initial encounter for closed fracture: Secondary | ICD-10-CM | POA: Diagnosis not present

## 2018-12-02 DIAGNOSIS — I83019 Varicose veins of right lower extremity with ulcer of unspecified site: Secondary | ICD-10-CM | POA: Diagnosis not present

## 2018-12-02 DIAGNOSIS — E119 Type 2 diabetes mellitus without complications: Secondary | ICD-10-CM | POA: Diagnosis not present

## 2018-12-02 DIAGNOSIS — I83009 Varicose veins of unspecified lower extremity with ulcer of unspecified site: Secondary | ICD-10-CM | POA: Diagnosis not present

## 2018-12-02 DIAGNOSIS — W19XXXA Unspecified fall, initial encounter: Secondary | ICD-10-CM | POA: Diagnosis not present

## 2018-12-02 DIAGNOSIS — S72051A Unspecified fracture of head of right femur, initial encounter for closed fracture: Secondary | ICD-10-CM | POA: Diagnosis not present

## 2018-12-02 DIAGNOSIS — R9431 Abnormal electrocardiogram [ECG] [EKG]: Secondary | ICD-10-CM | POA: Diagnosis not present

## 2018-12-02 DIAGNOSIS — I10 Essential (primary) hypertension: Secondary | ICD-10-CM | POA: Diagnosis not present

## 2018-12-09 DIAGNOSIS — L97321 Non-pressure chronic ulcer of left ankle limited to breakdown of skin: Secondary | ICD-10-CM | POA: Diagnosis not present

## 2018-12-09 DIAGNOSIS — E785 Hyperlipidemia, unspecified: Secondary | ICD-10-CM | POA: Diagnosis not present

## 2018-12-09 DIAGNOSIS — F418 Other specified anxiety disorders: Secondary | ICD-10-CM | POA: Diagnosis not present

## 2018-12-09 DIAGNOSIS — I83009 Varicose veins of unspecified lower extremity with ulcer of unspecified site: Secondary | ICD-10-CM | POA: Diagnosis not present

## 2018-12-09 DIAGNOSIS — Z96641 Presence of right artificial hip joint: Secondary | ICD-10-CM | POA: Diagnosis not present

## 2018-12-09 DIAGNOSIS — I10 Essential (primary) hypertension: Secondary | ICD-10-CM | POA: Diagnosis not present

## 2018-12-09 DIAGNOSIS — S72001D Fracture of unspecified part of neck of right femur, subsequent encounter for closed fracture with routine healing: Secondary | ICD-10-CM | POA: Diagnosis not present

## 2018-12-09 DIAGNOSIS — W19XXXA Unspecified fall, initial encounter: Secondary | ICD-10-CM | POA: Diagnosis not present

## 2018-12-09 DIAGNOSIS — R410 Disorientation, unspecified: Secondary | ICD-10-CM | POA: Diagnosis not present

## 2018-12-09 DIAGNOSIS — E871 Hypo-osmolality and hyponatremia: Secondary | ICD-10-CM | POA: Diagnosis not present

## 2018-12-09 DIAGNOSIS — G9341 Metabolic encephalopathy: Secondary | ICD-10-CM | POA: Diagnosis not present

## 2018-12-09 DIAGNOSIS — R740 Nonspecific elevation of levels of transaminase and lactic acid dehydrogenase [LDH]: Secondary | ICD-10-CM | POA: Diagnosis not present

## 2018-12-09 DIAGNOSIS — I739 Peripheral vascular disease, unspecified: Secondary | ICD-10-CM | POA: Diagnosis not present

## 2018-12-09 DIAGNOSIS — K219 Gastro-esophageal reflux disease without esophagitis: Secondary | ICD-10-CM | POA: Diagnosis not present

## 2018-12-09 DIAGNOSIS — D500 Iron deficiency anemia secondary to blood loss (chronic): Secondary | ICD-10-CM | POA: Diagnosis not present

## 2018-12-09 DIAGNOSIS — E039 Hypothyroidism, unspecified: Secondary | ICD-10-CM | POA: Diagnosis not present

## 2018-12-09 DIAGNOSIS — D649 Anemia, unspecified: Secondary | ICD-10-CM | POA: Diagnosis not present

## 2018-12-09 DIAGNOSIS — R262 Difficulty in walking, not elsewhere classified: Secondary | ICD-10-CM | POA: Diagnosis not present

## 2018-12-09 DIAGNOSIS — G8918 Other acute postprocedural pain: Secondary | ICD-10-CM | POA: Diagnosis not present

## 2018-12-09 DIAGNOSIS — Z471 Aftercare following joint replacement surgery: Secondary | ICD-10-CM | POA: Diagnosis not present

## 2018-12-09 DIAGNOSIS — R531 Weakness: Secondary | ICD-10-CM | POA: Diagnosis not present

## 2018-12-09 DIAGNOSIS — E440 Moderate protein-calorie malnutrition: Secondary | ICD-10-CM | POA: Diagnosis not present

## 2018-12-09 DIAGNOSIS — S72001A Fracture of unspecified part of neck of right femur, initial encounter for closed fracture: Secondary | ICD-10-CM | POA: Diagnosis not present

## 2018-12-12 DIAGNOSIS — D649 Anemia, unspecified: Secondary | ICD-10-CM | POA: Diagnosis not present

## 2018-12-12 DIAGNOSIS — G8918 Other acute postprocedural pain: Secondary | ICD-10-CM | POA: Diagnosis not present

## 2018-12-12 DIAGNOSIS — S72001D Fracture of unspecified part of neck of right femur, subsequent encounter for closed fracture with routine healing: Secondary | ICD-10-CM | POA: Diagnosis not present

## 2018-12-12 DIAGNOSIS — R262 Difficulty in walking, not elsewhere classified: Secondary | ICD-10-CM | POA: Diagnosis not present

## 2018-12-16 DIAGNOSIS — L97321 Non-pressure chronic ulcer of left ankle limited to breakdown of skin: Secondary | ICD-10-CM | POA: Diagnosis not present

## 2018-12-23 DIAGNOSIS — I129 Hypertensive chronic kidney disease with stage 1 through stage 4 chronic kidney disease, or unspecified chronic kidney disease: Secondary | ICD-10-CM | POA: Diagnosis not present

## 2018-12-23 DIAGNOSIS — I87323 Chronic venous hypertension (idiopathic) with inflammation of bilateral lower extremity: Secondary | ICD-10-CM | POA: Diagnosis not present

## 2018-12-23 DIAGNOSIS — S72001D Fracture of unspecified part of neck of right femur, subsequent encounter for closed fracture with routine healing: Secondary | ICD-10-CM | POA: Diagnosis not present

## 2018-12-23 DIAGNOSIS — E1122 Type 2 diabetes mellitus with diabetic chronic kidney disease: Secondary | ICD-10-CM | POA: Diagnosis not present

## 2018-12-23 DIAGNOSIS — J90 Pleural effusion, not elsewhere classified: Secondary | ICD-10-CM | POA: Diagnosis not present

## 2018-12-23 DIAGNOSIS — R2681 Unsteadiness on feet: Secondary | ICD-10-CM | POA: Diagnosis not present

## 2018-12-23 DIAGNOSIS — Z794 Long term (current) use of insulin: Secondary | ICD-10-CM | POA: Diagnosis not present

## 2018-12-23 DIAGNOSIS — E1151 Type 2 diabetes mellitus with diabetic peripheral angiopathy without gangrene: Secondary | ICD-10-CM | POA: Diagnosis not present

## 2018-12-23 DIAGNOSIS — N183 Chronic kidney disease, stage 3 (moderate): Secondary | ICD-10-CM | POA: Diagnosis not present

## 2018-12-24 DIAGNOSIS — I87323 Chronic venous hypertension (idiopathic) with inflammation of bilateral lower extremity: Secondary | ICD-10-CM | POA: Diagnosis not present

## 2018-12-24 DIAGNOSIS — Z794 Long term (current) use of insulin: Secondary | ICD-10-CM | POA: Diagnosis not present

## 2018-12-24 DIAGNOSIS — N183 Chronic kidney disease, stage 3 (moderate): Secondary | ICD-10-CM | POA: Diagnosis not present

## 2018-12-24 DIAGNOSIS — R2681 Unsteadiness on feet: Secondary | ICD-10-CM | POA: Diagnosis not present

## 2018-12-24 DIAGNOSIS — E1122 Type 2 diabetes mellitus with diabetic chronic kidney disease: Secondary | ICD-10-CM | POA: Diagnosis not present

## 2018-12-24 DIAGNOSIS — S72001D Fracture of unspecified part of neck of right femur, subsequent encounter for closed fracture with routine healing: Secondary | ICD-10-CM | POA: Diagnosis not present

## 2018-12-24 DIAGNOSIS — J90 Pleural effusion, not elsewhere classified: Secondary | ICD-10-CM | POA: Diagnosis not present

## 2018-12-24 DIAGNOSIS — E1151 Type 2 diabetes mellitus with diabetic peripheral angiopathy without gangrene: Secondary | ICD-10-CM | POA: Diagnosis not present

## 2018-12-24 DIAGNOSIS — I129 Hypertensive chronic kidney disease with stage 1 through stage 4 chronic kidney disease, or unspecified chronic kidney disease: Secondary | ICD-10-CM | POA: Diagnosis not present

## 2018-12-25 ENCOUNTER — Other Ambulatory Visit: Payer: Self-pay | Admitting: *Deleted

## 2018-12-25 NOTE — Patient Outreach (Signed)
Referral received from [facility] inpatient, pt discharged from [facility] facility on 12/22/18 with fracture neck of right femur, history PAD, HTN, DM, PVD, Primary MD office at [facility] provides transition of care. Rn CM outreached pt for screening, no answer to telephone [phone] and no option to leave voicemail.  RN CM mailed unsuccessful letter to pt home.  PLAN Outreach pt in 3-4 business days  Jacqlyn Larsen [facility], [place] [phone]

## 2018-12-26 DIAGNOSIS — N183 Chronic kidney disease, stage 3 (moderate): Secondary | ICD-10-CM | POA: Diagnosis not present

## 2018-12-26 DIAGNOSIS — Z794 Long term (current) use of insulin: Secondary | ICD-10-CM | POA: Diagnosis not present

## 2018-12-26 DIAGNOSIS — I87323 Chronic venous hypertension (idiopathic) with inflammation of bilateral lower extremity: Secondary | ICD-10-CM | POA: Diagnosis not present

## 2018-12-26 DIAGNOSIS — E1151 Type 2 diabetes mellitus with diabetic peripheral angiopathy without gangrene: Secondary | ICD-10-CM | POA: Diagnosis not present

## 2018-12-26 DIAGNOSIS — R2681 Unsteadiness on feet: Secondary | ICD-10-CM | POA: Diagnosis not present

## 2018-12-26 DIAGNOSIS — J90 Pleural effusion, not elsewhere classified: Secondary | ICD-10-CM | POA: Diagnosis not present

## 2018-12-26 DIAGNOSIS — I129 Hypertensive chronic kidney disease with stage 1 through stage 4 chronic kidney disease, or unspecified chronic kidney disease: Secondary | ICD-10-CM | POA: Diagnosis not present

## 2018-12-26 DIAGNOSIS — E1122 Type 2 diabetes mellitus with diabetic chronic kidney disease: Secondary | ICD-10-CM | POA: Diagnosis not present

## 2018-12-26 DIAGNOSIS — S72001D Fracture of unspecified part of neck of right femur, subsequent encounter for closed fracture with routine healing: Secondary | ICD-10-CM | POA: Diagnosis not present

## 2018-12-27 DIAGNOSIS — I87323 Chronic venous hypertension (idiopathic) with inflammation of bilateral lower extremity: Secondary | ICD-10-CM | POA: Diagnosis not present

## 2018-12-27 DIAGNOSIS — E1122 Type 2 diabetes mellitus with diabetic chronic kidney disease: Secondary | ICD-10-CM | POA: Diagnosis not present

## 2018-12-27 DIAGNOSIS — J90 Pleural effusion, not elsewhere classified: Secondary | ICD-10-CM | POA: Diagnosis not present

## 2018-12-27 DIAGNOSIS — N183 Chronic kidney disease, stage 3 (moderate): Secondary | ICD-10-CM | POA: Diagnosis not present

## 2018-12-27 DIAGNOSIS — E1151 Type 2 diabetes mellitus with diabetic peripheral angiopathy without gangrene: Secondary | ICD-10-CM | POA: Diagnosis not present

## 2018-12-27 DIAGNOSIS — I129 Hypertensive chronic kidney disease with stage 1 through stage 4 chronic kidney disease, or unspecified chronic kidney disease: Secondary | ICD-10-CM | POA: Diagnosis not present

## 2018-12-27 DIAGNOSIS — Z794 Long term (current) use of insulin: Secondary | ICD-10-CM | POA: Diagnosis not present

## 2018-12-27 DIAGNOSIS — S72001D Fracture of unspecified part of neck of right femur, subsequent encounter for closed fracture with routine healing: Secondary | ICD-10-CM | POA: Diagnosis not present

## 2018-12-27 DIAGNOSIS — R2681 Unsteadiness on feet: Secondary | ICD-10-CM | POA: Diagnosis not present

## 2018-12-29 DIAGNOSIS — R2681 Unsteadiness on feet: Secondary | ICD-10-CM | POA: Diagnosis not present

## 2018-12-29 DIAGNOSIS — J90 Pleural effusion, not elsewhere classified: Secondary | ICD-10-CM | POA: Diagnosis not present

## 2018-12-29 DIAGNOSIS — E1122 Type 2 diabetes mellitus with diabetic chronic kidney disease: Secondary | ICD-10-CM | POA: Diagnosis not present

## 2018-12-29 DIAGNOSIS — E1151 Type 2 diabetes mellitus with diabetic peripheral angiopathy without gangrene: Secondary | ICD-10-CM | POA: Diagnosis not present

## 2018-12-29 DIAGNOSIS — Z794 Long term (current) use of insulin: Secondary | ICD-10-CM | POA: Diagnosis not present

## 2018-12-29 DIAGNOSIS — N183 Chronic kidney disease, stage 3 (moderate): Secondary | ICD-10-CM | POA: Diagnosis not present

## 2018-12-29 DIAGNOSIS — I129 Hypertensive chronic kidney disease with stage 1 through stage 4 chronic kidney disease, or unspecified chronic kidney disease: Secondary | ICD-10-CM | POA: Diagnosis not present

## 2018-12-29 DIAGNOSIS — S72001D Fracture of unspecified part of neck of right femur, subsequent encounter for closed fracture with routine healing: Secondary | ICD-10-CM | POA: Diagnosis not present

## 2018-12-29 DIAGNOSIS — I87323 Chronic venous hypertension (idiopathic) with inflammation of bilateral lower extremity: Secondary | ICD-10-CM | POA: Diagnosis not present

## 2018-12-30 ENCOUNTER — Other Ambulatory Visit: Payer: Self-pay | Admitting: *Deleted

## 2018-12-30 DIAGNOSIS — R2681 Unsteadiness on feet: Secondary | ICD-10-CM | POA: Diagnosis not present

## 2018-12-30 DIAGNOSIS — Z9181 History of falling: Secondary | ICD-10-CM | POA: Diagnosis not present

## 2018-12-30 DIAGNOSIS — E1140 Type 2 diabetes mellitus with diabetic neuropathy, unspecified: Secondary | ICD-10-CM | POA: Diagnosis not present

## 2018-12-30 DIAGNOSIS — I87323 Chronic venous hypertension (idiopathic) with inflammation of bilateral lower extremity: Secondary | ICD-10-CM | POA: Diagnosis not present

## 2018-12-30 DIAGNOSIS — E785 Hyperlipidemia, unspecified: Secondary | ICD-10-CM | POA: Diagnosis not present

## 2018-12-30 DIAGNOSIS — S72001D Fracture of unspecified part of neck of right femur, subsequent encounter for closed fracture with routine healing: Secondary | ICD-10-CM | POA: Diagnosis not present

## 2018-12-30 DIAGNOSIS — I129 Hypertensive chronic kidney disease with stage 1 through stage 4 chronic kidney disease, or unspecified chronic kidney disease: Secondary | ICD-10-CM | POA: Diagnosis not present

## 2018-12-30 DIAGNOSIS — E039 Hypothyroidism, unspecified: Secondary | ICD-10-CM | POA: Diagnosis not present

## 2018-12-30 DIAGNOSIS — N183 Chronic kidney disease, stage 3 (moderate): Secondary | ICD-10-CM | POA: Diagnosis not present

## 2018-12-30 DIAGNOSIS — J90 Pleural effusion, not elsewhere classified: Secondary | ICD-10-CM | POA: Diagnosis not present

## 2018-12-30 DIAGNOSIS — F419 Anxiety disorder, unspecified: Secondary | ICD-10-CM | POA: Diagnosis not present

## 2018-12-30 DIAGNOSIS — Z794 Long term (current) use of insulin: Secondary | ICD-10-CM | POA: Diagnosis not present

## 2018-12-30 DIAGNOSIS — Z0000 Encounter for general adult medical examination without abnormal findings: Secondary | ICD-10-CM | POA: Diagnosis not present

## 2018-12-30 DIAGNOSIS — E1122 Type 2 diabetes mellitus with diabetic chronic kidney disease: Secondary | ICD-10-CM | POA: Diagnosis not present

## 2018-12-30 DIAGNOSIS — E1151 Type 2 diabetes mellitus with diabetic peripheral angiopathy without gangrene: Secondary | ICD-10-CM | POA: Diagnosis not present

## 2018-12-30 DIAGNOSIS — I10 Essential (primary) hypertension: Secondary | ICD-10-CM | POA: Diagnosis not present

## 2018-12-30 DIAGNOSIS — Z1331 Encounter for screening for depression: Secondary | ICD-10-CM | POA: Diagnosis not present

## 2018-12-30 DIAGNOSIS — E1159 Type 2 diabetes mellitus with other circulatory complications: Secondary | ICD-10-CM

## 2018-12-30 DIAGNOSIS — R601 Generalized edema: Secondary | ICD-10-CM | POA: Diagnosis not present

## 2018-12-30 NOTE — Patient Outreach (Signed)
Referral received [facility], pt discharged [facility] on 12/22/18 with diagnosis fracture neck of femur, PAD, PVD, HTN, DM, outreach call to pt/ 2nd attempt, Primary MD office completes transition of care, spoke with pt, HIPAA verified, screening completed, pt has assistance of her adult children and son lives with her, pt states she has hired help for bathing, reports [facility], RN and PT services, pt reports she has a walker and using as prescribed and is in process of getting a hospital bed, pt states her spouse passed away 10/09/2018 and this has been difficult transition for her, RN CM discussed diabetes and pt states she checks CBG BID with readings 70-300's range, pt states " it can be low or high as 300"  Pt states she does not know what AIC reading is or what this means, (AIC 8.7 09/17/18) she states " I've been diabetic for a long time"  RN CM discussed diet and pt seems to have knowledge deficit related to her diabetes, pt states she is willing for someone to come to her home and bring some teaching materials " if you think this could help me" .  PLAN Order placed for RN community for home visit, further teaching related to diabetes Pt requests diabetes teaching tools/ handouts  Jacqlyn Larsen [facility], [place] Coordinator [phone]

## 2018-12-31 ENCOUNTER — Other Ambulatory Visit: Payer: Self-pay

## 2018-12-31 NOTE — Patient Outreach (Signed)
Care Coordination: New referral for DM education. MD office does transition of care.  Placed call to patient who answered. Reviewed reason for call. Offered home visit for 01/05/2019 and patient accepted. Confirmed address. Provided my contact information.  Tomasa Rand, RN, BSN, CEN [facility] [employer] [phone]

## 2019-01-01 DIAGNOSIS — N183 Chronic kidney disease, stage 3 (moderate): Secondary | ICD-10-CM | POA: Diagnosis not present

## 2019-01-01 DIAGNOSIS — I87323 Chronic venous hypertension (idiopathic) with inflammation of bilateral lower extremity: Secondary | ICD-10-CM | POA: Diagnosis not present

## 2019-01-01 DIAGNOSIS — S72001D Fracture of unspecified part of neck of right femur, subsequent encounter for closed fracture with routine healing: Secondary | ICD-10-CM | POA: Diagnosis not present

## 2019-01-01 DIAGNOSIS — Z794 Long term (current) use of insulin: Secondary | ICD-10-CM | POA: Diagnosis not present

## 2019-01-01 DIAGNOSIS — I129 Hypertensive chronic kidney disease with stage 1 through stage 4 chronic kidney disease, or unspecified chronic kidney disease: Secondary | ICD-10-CM | POA: Diagnosis not present

## 2019-01-01 DIAGNOSIS — R2681 Unsteadiness on feet: Secondary | ICD-10-CM | POA: Diagnosis not present

## 2019-01-01 DIAGNOSIS — E1151 Type 2 diabetes mellitus with diabetic peripheral angiopathy without gangrene: Secondary | ICD-10-CM | POA: Diagnosis not present

## 2019-01-01 DIAGNOSIS — E1122 Type 2 diabetes mellitus with diabetic chronic kidney disease: Secondary | ICD-10-CM | POA: Diagnosis not present

## 2019-01-01 DIAGNOSIS — J90 Pleural effusion, not elsewhere classified: Secondary | ICD-10-CM | POA: Diagnosis not present

## 2019-01-02 DIAGNOSIS — J90 Pleural effusion, not elsewhere classified: Secondary | ICD-10-CM | POA: Diagnosis not present

## 2019-01-02 DIAGNOSIS — S72001D Fracture of unspecified part of neck of right femur, subsequent encounter for closed fracture with routine healing: Secondary | ICD-10-CM | POA: Diagnosis not present

## 2019-01-02 DIAGNOSIS — Z794 Long term (current) use of insulin: Secondary | ICD-10-CM | POA: Diagnosis not present

## 2019-01-02 DIAGNOSIS — I129 Hypertensive chronic kidney disease with stage 1 through stage 4 chronic kidney disease, or unspecified chronic kidney disease: Secondary | ICD-10-CM | POA: Diagnosis not present

## 2019-01-02 DIAGNOSIS — E1122 Type 2 diabetes mellitus with diabetic chronic kidney disease: Secondary | ICD-10-CM | POA: Diagnosis not present

## 2019-01-02 DIAGNOSIS — N183 Chronic kidney disease, stage 3 (moderate): Secondary | ICD-10-CM | POA: Diagnosis not present

## 2019-01-02 DIAGNOSIS — R2681 Unsteadiness on feet: Secondary | ICD-10-CM | POA: Diagnosis not present

## 2019-01-02 DIAGNOSIS — I87323 Chronic venous hypertension (idiopathic) with inflammation of bilateral lower extremity: Secondary | ICD-10-CM | POA: Diagnosis not present

## 2019-01-02 DIAGNOSIS — E1151 Type 2 diabetes mellitus with diabetic peripheral angiopathy without gangrene: Secondary | ICD-10-CM | POA: Diagnosis not present

## 2019-01-05 ENCOUNTER — Other Ambulatory Visit: Payer: Self-pay

## 2019-01-05 DIAGNOSIS — R2681 Unsteadiness on feet: Secondary | ICD-10-CM | POA: Diagnosis not present

## 2019-01-05 DIAGNOSIS — S72001D Fracture of unspecified part of neck of right femur, subsequent encounter for closed fracture with routine healing: Secondary | ICD-10-CM | POA: Diagnosis not present

## 2019-01-05 DIAGNOSIS — I129 Hypertensive chronic kidney disease with stage 1 through stage 4 chronic kidney disease, or unspecified chronic kidney disease: Secondary | ICD-10-CM | POA: Diagnosis not present

## 2019-01-05 DIAGNOSIS — J90 Pleural effusion, not elsewhere classified: Secondary | ICD-10-CM | POA: Diagnosis not present

## 2019-01-05 DIAGNOSIS — E1122 Type 2 diabetes mellitus with diabetic chronic kidney disease: Secondary | ICD-10-CM | POA: Diagnosis not present

## 2019-01-05 DIAGNOSIS — N183 Chronic kidney disease, stage 3 (moderate): Secondary | ICD-10-CM | POA: Diagnosis not present

## 2019-01-05 DIAGNOSIS — I87323 Chronic venous hypertension (idiopathic) with inflammation of bilateral lower extremity: Secondary | ICD-10-CM | POA: Diagnosis not present

## 2019-01-05 DIAGNOSIS — E1151 Type 2 diabetes mellitus with diabetic peripheral angiopathy without gangrene: Secondary | ICD-10-CM | POA: Diagnosis not present

## 2019-01-05 DIAGNOSIS — Z794 Long term (current) use of insulin: Secondary | ICD-10-CM | POA: Diagnosis not present

## 2019-01-05 NOTE — Patient Outreach (Signed)
[facility]) Care Management   01/05/2019  [name] 10/02/1941 [id number]  [name] is an 78 y.o. female Initial telephone assessment/ home visit cancelled per mangement due to [employer]. Subjective: Patient reports that she is doing well. Reports she fell one month ago after carrying laundry basket into sons room. Reports she broke her hip. Reports hip was fixed and she went to [facility] home for rehab.  Patient reports that she is walking with a walker. Is active with [facility] for PT and nursing. Patient reports her DM is up and down. She saw primary MD last week and had labs but does not know results.  Report todays random CBG while on the phone of 221.  Reports she has had some swelling in her abdomen. Reports taking a fluid pill.  States she does not know what medications she is on. Reports her son's girl friend assist with her medications.  Reports she wears compression hose daily. Assisted by family to put on compression hose.  Objective:  Appears awake and alert on the phone. Today's Vitals   01/05/19 0955  Weight: 127 lb (57.6 kg)  Height: 1.575 m (5\' 2" )  PainSc: 0-No pain   Review of Systems  Constitutional: Positive for malaise/fatigue and weight loss.  HENT: Positive for hearing loss.   Eyes:       Reports she lost her glasses. Is currently using reading glasses.  Respiratory: Negative.   Cardiovascular: Positive for leg swelling.       Reports abdominal swelling  Gastrointestinal: Negative.   Genitourinary: Negative.   Musculoskeletal: Positive for falls.       Fell 1 month ago and broke hip  Skin: Negative.   Neurological: Negative.   Endo/Heme/Allergies: Negative.   Psychiatric/Behavioral: Negative.     Physical Exam  Encounter Medications:  (UNABLE TO CONFIRM/ PATIENT UNABLE TO REVIEW) Outpatient Encounter Medications as of 01/05/2019  Medication Sig  . amLODipine-benazepril (LOTREL) 5-40 MG capsule Take 1  capsule by mouth at bedtime.  Marland Kitchen atenolol (TENORMIN) 100 MG tablet Take 100 mg by mouth daily.  Marland Kitchen atorvastatin (LIPITOR) 40 MG tablet Take 40 mg by mouth at bedtime.  . fenofibrate 160 MG tablet Take 160 mg by mouth daily.  Marland Kitchen gabapentin (NEURONTIN) 100 MG capsule Take 1 capsule (100 mg total) by mouth 2 (two) times daily.  . Insulin Glargine (LANTUS SOLOSTAR) 100 UNIT/ML Solostar Pen Inject 20 Units into the skin daily at 10 pm.  . lansoprazole (PREVACID) 30 MG capsule Take 30 mg by mouth 2 (two) times daily.  Marland Kitchen levothyroxine (SYNTHROID, LEVOTHROID) 112 MCG tablet Take 112 mcg by mouth daily before breakfast.   . losartan (COZAAR) 100 MG tablet Take 100 mg by mouth daily with breakfast.   No facility-administered encounter medications on file as of 01/05/2019.     Functional Status:   In your present state of health, do you have any difficulty performing the following activities: 01/05/2019 07/15/2018  Hearing? N Y  Vision? Y N  Difficulty concentrating or making decisions? Y N  Walking or climbing stairs? Y Y  Comment recent hip fracture- uses walker -  Dressing or bathing? N Y  Doing errands, shopping? [place] Donning  Preparing Food and eating ? Y -  Comment son does cooking -  Using the Toilet? N -  In the past six months, have you accidently leaked urine? N -  Do you have problems with loss of bowel control? N -  Managing  your Medications? Y -  Managing your Finances? N -  Housekeeping or managing your Housekeeping? Y -  Comment son cleans house -  Some recent data might be hidden    Fall/Depression Screening:    Fall Risk  01/05/2019  Falls in the past year? 1  Number falls in past yr: 1  Injury with Fall? 1  Risk for fall due to : History of fall(s)  Follow up Falls evaluation completed   PHQ 2/9 Scores 01/05/2019 12/30/2018  PHQ - 2 Score 0 1    Assessment:  (1) reviewed [facility] program. Will mail [facility] welcome folder and consent. Reviewed 24 hour nurse line. Provided my contact  information. (2) recent hip fracture from fall. Active with home health PT (3) DM: reports has had DM for years. Unknown A1c.  Reports random CBG today of 221 non fasting. Takes medications as prescribed. Reports she is able to inject herself with insulin. (4) fall risk (5) reports abdominal swelling from fluid overload. Not currently weighing daily.   Plan:  (1) consent and THN packet mailed to patient. Will await a consent form to return to the office. (2) reviewed hip precautions and fall precautions. Encouraged patient to do home exercises as directed by home health PT. (3) Reviewed importance of monitoring and recording CBG readings.  Reviewed DM diet. Will mail education material to patient at home address. (4) reviewed safety precautions with patient. Use of assistive devices and non slip socks and shoes. (5)  Reviewed importance of daily weights, reviewed heart failure zones and when to report weight gain.  Will plan follow up phone call in 1 week. This note and barrier letter send to MD. MD office does transition of care.  THN CM Care Plan Problem One     Most Recent Value  Care Plan Problem One  Patient reports DM with range of CBG of 70-200.   Role Documenting the Problem One  Care Management [place] for Problem One  Active  [facility] Long Term Goal   Patient will report improved CBG range in the next 60 days.   THN Long Term Goal Start Date  01/05/19  Interventions for Problem One Long Term Goal  Completed telephone assessment. Reviewed diet. Mailed DM education  THN CM Short Term Goal #1   Patient will report monitoring CBG daily and recording for the next 30 days.  THN CM Short Term Goal #1 Start Date  01/05/19  Interventions for Short Term Goal #1  Reviewed importance of  recordinf CBG daily and call MD for abnormal readings.   THN CM Short Term Goal #2   Patient will report weighing and recording daily for the next 30 days.   THN CM Short Term Goal #2 Start Date   01/05/19  Interventions for Short Term Goal #2  Reported heart failure zones and encouraged patient to weigh daily.      Tomasa Rand, RN, BSN, CEN [facility] [employer] [phone]

## 2019-01-06 DIAGNOSIS — S72001D Fracture of unspecified part of neck of right femur, subsequent encounter for closed fracture with routine healing: Secondary | ICD-10-CM | POA: Diagnosis not present

## 2019-01-06 DIAGNOSIS — F0390 Unspecified dementia without behavioral disturbance: Secondary | ICD-10-CM | POA: Diagnosis not present

## 2019-01-06 DIAGNOSIS — Z794 Long term (current) use of insulin: Secondary | ICD-10-CM | POA: Diagnosis not present

## 2019-01-06 DIAGNOSIS — E119 Type 2 diabetes mellitus without complications: Secondary | ICD-10-CM | POA: Diagnosis not present

## 2019-01-06 DIAGNOSIS — E1151 Type 2 diabetes mellitus with diabetic peripheral angiopathy without gangrene: Secondary | ICD-10-CM | POA: Diagnosis not present

## 2019-01-06 DIAGNOSIS — R7989 Other specified abnormal findings of blood chemistry: Secondary | ICD-10-CM | POA: Diagnosis not present

## 2019-01-06 DIAGNOSIS — J90 Pleural effusion, not elsewhere classified: Secondary | ICD-10-CM | POA: Diagnosis not present

## 2019-01-06 DIAGNOSIS — I87323 Chronic venous hypertension (idiopathic) with inflammation of bilateral lower extremity: Secondary | ICD-10-CM | POA: Diagnosis not present

## 2019-01-06 DIAGNOSIS — R2681 Unsteadiness on feet: Secondary | ICD-10-CM | POA: Diagnosis not present

## 2019-01-06 DIAGNOSIS — R188 Other ascites: Secondary | ICD-10-CM | POA: Diagnosis not present

## 2019-01-06 DIAGNOSIS — E8809 Other disorders of plasma-protein metabolism, not elsewhere classified: Secondary | ICD-10-CM | POA: Diagnosis not present

## 2019-01-06 DIAGNOSIS — R809 Proteinuria, unspecified: Secondary | ICD-10-CM | POA: Diagnosis not present

## 2019-01-06 DIAGNOSIS — E1122 Type 2 diabetes mellitus with diabetic chronic kidney disease: Secondary | ICD-10-CM | POA: Diagnosis not present

## 2019-01-06 DIAGNOSIS — E871 Hypo-osmolality and hyponatremia: Secondary | ICD-10-CM | POA: Diagnosis not present

## 2019-01-06 DIAGNOSIS — R600 Localized edema: Secondary | ICD-10-CM | POA: Diagnosis not present

## 2019-01-06 DIAGNOSIS — R846 Abnormal cytological findings in specimens from respiratory organs and thorax: Secondary | ICD-10-CM | POA: Diagnosis not present

## 2019-01-06 DIAGNOSIS — J181 Lobar pneumonia, unspecified organism: Secondary | ICD-10-CM | POA: Diagnosis not present

## 2019-01-06 DIAGNOSIS — R9389 Abnormal findings on diagnostic imaging of other specified body structures: Secondary | ICD-10-CM | POA: Diagnosis not present

## 2019-01-06 DIAGNOSIS — R601 Generalized edema: Secondary | ICD-10-CM | POA: Diagnosis not present

## 2019-01-06 DIAGNOSIS — R0602 Shortness of breath: Secondary | ICD-10-CM | POA: Diagnosis not present

## 2019-01-06 DIAGNOSIS — N183 Chronic kidney disease, stage 3 (moderate): Secondary | ICD-10-CM | POA: Diagnosis not present

## 2019-01-06 DIAGNOSIS — F418 Other specified anxiety disorders: Secondary | ICD-10-CM | POA: Diagnosis not present

## 2019-01-06 DIAGNOSIS — F329 Major depressive disorder, single episode, unspecified: Secondary | ICD-10-CM | POA: Diagnosis not present

## 2019-01-06 DIAGNOSIS — E039 Hypothyroidism, unspecified: Secondary | ICD-10-CM | POA: Diagnosis not present

## 2019-01-06 DIAGNOSIS — I129 Hypertensive chronic kidney disease with stage 1 through stage 4 chronic kidney disease, or unspecified chronic kidney disease: Secondary | ICD-10-CM | POA: Diagnosis not present

## 2019-01-06 DIAGNOSIS — I361 Nonrheumatic tricuspid (valve) insufficiency: Secondary | ICD-10-CM | POA: Diagnosis not present

## 2019-01-06 DIAGNOSIS — I10 Essential (primary) hypertension: Secondary | ICD-10-CM | POA: Diagnosis not present

## 2019-01-07 DIAGNOSIS — E8809 Other disorders of plasma-protein metabolism, not elsewhere classified: Secondary | ICD-10-CM

## 2019-01-07 DIAGNOSIS — E871 Hypo-osmolality and hyponatremia: Secondary | ICD-10-CM

## 2019-01-07 DIAGNOSIS — E119 Type 2 diabetes mellitus without complications: Secondary | ICD-10-CM

## 2019-01-07 DIAGNOSIS — I361 Nonrheumatic tricuspid (valve) insufficiency: Secondary | ICD-10-CM

## 2019-01-07 DIAGNOSIS — R7989 Other specified abnormal findings of blood chemistry: Secondary | ICD-10-CM

## 2019-01-11 DIAGNOSIS — I129 Hypertensive chronic kidney disease with stage 1 through stage 4 chronic kidney disease, or unspecified chronic kidney disease: Secondary | ICD-10-CM | POA: Diagnosis not present

## 2019-01-11 DIAGNOSIS — I87323 Chronic venous hypertension (idiopathic) with inflammation of bilateral lower extremity: Secondary | ICD-10-CM | POA: Diagnosis not present

## 2019-01-11 DIAGNOSIS — N183 Chronic kidney disease, stage 3 (moderate): Secondary | ICD-10-CM | POA: Diagnosis not present

## 2019-01-11 DIAGNOSIS — E1122 Type 2 diabetes mellitus with diabetic chronic kidney disease: Secondary | ICD-10-CM | POA: Diagnosis not present

## 2019-01-11 DIAGNOSIS — R2681 Unsteadiness on feet: Secondary | ICD-10-CM | POA: Diagnosis not present

## 2019-01-11 DIAGNOSIS — E1151 Type 2 diabetes mellitus with diabetic peripheral angiopathy without gangrene: Secondary | ICD-10-CM | POA: Diagnosis not present

## 2019-01-11 DIAGNOSIS — J90 Pleural effusion, not elsewhere classified: Secondary | ICD-10-CM | POA: Diagnosis not present

## 2019-01-11 DIAGNOSIS — Z794 Long term (current) use of insulin: Secondary | ICD-10-CM | POA: Diagnosis not present

## 2019-01-11 DIAGNOSIS — S72001D Fracture of unspecified part of neck of right femur, subsequent encounter for closed fracture with routine healing: Secondary | ICD-10-CM | POA: Diagnosis not present

## 2019-01-12 ENCOUNTER — Other Ambulatory Visit: Payer: Self-pay

## 2019-01-12 NOTE — Patient Outreach (Signed)
Telephone follow up:  Placed call to patient who reports she is doing well. Continues to use her walker. Reports she is working with PT.  States CBG of 208 today. States she is taking all her medications as prescribed. Patient requested I speak to her daughter in law, who manages her medications.  Reviewed all medications via phone.   Daughter in law reports she is going to call MD office today to review CBGs and get a follow up office visit.  PLAN: will follow up with patient in 2 weeks. Encouraged social distancing. Reviewed importance of following DM instructions by MD.   Outpatient Encounter Medications as of 01/12/2019  Medication Sig  . alendronate (FOSAMAX) 70 MG tablet Take 70 mg by mouth once a week. Take with a full glass of water on an empty stomach.  Takes on Friday  . amLODipine-benazepril (LOTREL) 5-40 MG capsule Take 1 capsule by mouth at bedtime.  Marland Kitchen aspirin 325 MG tablet Take 325 mg by mouth daily.  Marland Kitchen atenolol (TENORMIN) 100 MG tablet Take 100 mg by mouth daily.  Marland Kitchen atorvastatin (LIPITOR) 40 MG tablet Take 40 mg by mouth at bedtime.  . gabapentin (NEURONTIN) 300 MG capsule Take 300 mg by mouth 2 (two) times daily.  . hydrALAZINE (APRESOLINE) 25 MG tablet Take 25 mg by mouth 3 (three) times daily.  . Insulin Glargine (LANTUS SOLOSTAR) 100 UNIT/ML Solostar Pen Inject 20 Units into the skin daily at 10 pm. (Patient taking differently: Inject 10 Units into the skin daily at 10 pm. )  . lansoprazole (PREVACID) 30 MG capsule Take 30 mg by mouth 2 (two) times daily.  Marland Kitchen levothyroxine (SYNTHROID, LEVOTHROID) 137 MCG tablet Take 137 mcg by mouth daily before breakfast.  . losartan (COZAAR) 100 MG tablet Take 100 mg by mouth daily with breakfast.  . nicotine (NICODERM CQ - DOSED IN MG/24 HOURS) 14 mg/24hr patch Place 14 mg onto the skin daily.  Marland Kitchen PARoxetine (PAXIL) 40 MG tablet Take 40 mg by mouth every morning.  . potassium chloride SA (K-DUR,KLOR-CON) 20 MEQ tablet Take 20 mEq by mouth  daily.  Marland Kitchen rOPINIRole (REQUIP) 4 MG tablet Take 4 mg by mouth at bedtime.  . torsemide (DEMADEX) 20 MG tablet Take 40 mg by mouth daily.  . [DISCONTINUED] fenofibrate 160 MG tablet Take 160 mg by mouth daily.  . [DISCONTINUED] gabapentin (NEURONTIN) 100 MG capsule Take 1 capsule (100 mg total) by mouth 2 (two) times daily.  . [DISCONTINUED] levothyroxine (SYNTHROID, LEVOTHROID) 112 MCG tablet Take 112 mcg by mouth daily before breakfast.    No facility-administered encounter medications on file as of 01/12/2019.   Tomasa Rand, RN, BSN, CEN [facility] [employer] [phone]

## 2019-01-13 DIAGNOSIS — I10 Essential (primary) hypertension: Secondary | ICD-10-CM | POA: Diagnosis not present

## 2019-01-13 DIAGNOSIS — R601 Generalized edema: Secondary | ICD-10-CM | POA: Diagnosis not present

## 2019-01-14 DIAGNOSIS — E1122 Type 2 diabetes mellitus with diabetic chronic kidney disease: Secondary | ICD-10-CM | POA: Diagnosis not present

## 2019-01-14 DIAGNOSIS — N183 Chronic kidney disease, stage 3 (moderate): Secondary | ICD-10-CM | POA: Diagnosis not present

## 2019-01-14 DIAGNOSIS — S72001D Fracture of unspecified part of neck of right femur, subsequent encounter for closed fracture with routine healing: Secondary | ICD-10-CM | POA: Diagnosis not present

## 2019-01-14 DIAGNOSIS — E1151 Type 2 diabetes mellitus with diabetic peripheral angiopathy without gangrene: Secondary | ICD-10-CM | POA: Diagnosis not present

## 2019-01-14 DIAGNOSIS — I87323 Chronic venous hypertension (idiopathic) with inflammation of bilateral lower extremity: Secondary | ICD-10-CM | POA: Diagnosis not present

## 2019-01-14 DIAGNOSIS — R2681 Unsteadiness on feet: Secondary | ICD-10-CM | POA: Diagnosis not present

## 2019-01-14 DIAGNOSIS — I129 Hypertensive chronic kidney disease with stage 1 through stage 4 chronic kidney disease, or unspecified chronic kidney disease: Secondary | ICD-10-CM | POA: Diagnosis not present

## 2019-01-14 DIAGNOSIS — J90 Pleural effusion, not elsewhere classified: Secondary | ICD-10-CM | POA: Diagnosis not present

## 2019-01-14 DIAGNOSIS — R601 Generalized edema: Secondary | ICD-10-CM | POA: Diagnosis not present

## 2019-01-14 DIAGNOSIS — Z794 Long term (current) use of insulin: Secondary | ICD-10-CM | POA: Diagnosis not present

## 2019-01-16 DIAGNOSIS — N183 Chronic kidney disease, stage 3 (moderate): Secondary | ICD-10-CM | POA: Diagnosis not present

## 2019-01-16 DIAGNOSIS — S72001D Fracture of unspecified part of neck of right femur, subsequent encounter for closed fracture with routine healing: Secondary | ICD-10-CM | POA: Diagnosis not present

## 2019-01-16 DIAGNOSIS — R2681 Unsteadiness on feet: Secondary | ICD-10-CM | POA: Diagnosis not present

## 2019-01-16 DIAGNOSIS — J90 Pleural effusion, not elsewhere classified: Secondary | ICD-10-CM | POA: Diagnosis not present

## 2019-01-16 DIAGNOSIS — I87323 Chronic venous hypertension (idiopathic) with inflammation of bilateral lower extremity: Secondary | ICD-10-CM | POA: Diagnosis not present

## 2019-01-16 DIAGNOSIS — E1122 Type 2 diabetes mellitus with diabetic chronic kidney disease: Secondary | ICD-10-CM | POA: Diagnosis not present

## 2019-01-16 DIAGNOSIS — Z794 Long term (current) use of insulin: Secondary | ICD-10-CM | POA: Diagnosis not present

## 2019-01-16 DIAGNOSIS — E1151 Type 2 diabetes mellitus with diabetic peripheral angiopathy without gangrene: Secondary | ICD-10-CM | POA: Diagnosis not present

## 2019-01-16 DIAGNOSIS — I129 Hypertensive chronic kidney disease with stage 1 through stage 4 chronic kidney disease, or unspecified chronic kidney disease: Secondary | ICD-10-CM | POA: Diagnosis not present

## 2019-01-20 DIAGNOSIS — E1122 Type 2 diabetes mellitus with diabetic chronic kidney disease: Secondary | ICD-10-CM | POA: Diagnosis not present

## 2019-01-20 DIAGNOSIS — S72001D Fracture of unspecified part of neck of right femur, subsequent encounter for closed fracture with routine healing: Secondary | ICD-10-CM | POA: Diagnosis not present

## 2019-01-20 DIAGNOSIS — R2681 Unsteadiness on feet: Secondary | ICD-10-CM | POA: Diagnosis not present

## 2019-01-20 DIAGNOSIS — E1151 Type 2 diabetes mellitus with diabetic peripheral angiopathy without gangrene: Secondary | ICD-10-CM | POA: Diagnosis not present

## 2019-01-20 DIAGNOSIS — J90 Pleural effusion, not elsewhere classified: Secondary | ICD-10-CM | POA: Diagnosis not present

## 2019-01-20 DIAGNOSIS — N183 Chronic kidney disease, stage 3 (moderate): Secondary | ICD-10-CM | POA: Diagnosis not present

## 2019-01-20 DIAGNOSIS — Z794 Long term (current) use of insulin: Secondary | ICD-10-CM | POA: Diagnosis not present

## 2019-01-20 DIAGNOSIS — I129 Hypertensive chronic kidney disease with stage 1 through stage 4 chronic kidney disease, or unspecified chronic kidney disease: Secondary | ICD-10-CM | POA: Diagnosis not present

## 2019-01-20 DIAGNOSIS — I87323 Chronic venous hypertension (idiopathic) with inflammation of bilateral lower extremity: Secondary | ICD-10-CM | POA: Diagnosis not present

## 2019-01-22 DIAGNOSIS — J90 Pleural effusion, not elsewhere classified: Secondary | ICD-10-CM | POA: Diagnosis not present

## 2019-01-22 DIAGNOSIS — R2681 Unsteadiness on feet: Secondary | ICD-10-CM | POA: Diagnosis not present

## 2019-01-22 DIAGNOSIS — N183 Chronic kidney disease, stage 3 (moderate): Secondary | ICD-10-CM | POA: Diagnosis not present

## 2019-01-22 DIAGNOSIS — I129 Hypertensive chronic kidney disease with stage 1 through stage 4 chronic kidney disease, or unspecified chronic kidney disease: Secondary | ICD-10-CM | POA: Diagnosis not present

## 2019-01-22 DIAGNOSIS — E1151 Type 2 diabetes mellitus with diabetic peripheral angiopathy without gangrene: Secondary | ICD-10-CM | POA: Diagnosis not present

## 2019-01-22 DIAGNOSIS — Z794 Long term (current) use of insulin: Secondary | ICD-10-CM | POA: Diagnosis not present

## 2019-01-22 DIAGNOSIS — I87323 Chronic venous hypertension (idiopathic) with inflammation of bilateral lower extremity: Secondary | ICD-10-CM | POA: Diagnosis not present

## 2019-01-22 DIAGNOSIS — E1122 Type 2 diabetes mellitus with diabetic chronic kidney disease: Secondary | ICD-10-CM | POA: Diagnosis not present

## 2019-01-22 DIAGNOSIS — S72001D Fracture of unspecified part of neck of right femur, subsequent encounter for closed fracture with routine healing: Secondary | ICD-10-CM | POA: Diagnosis not present

## 2019-01-23 DIAGNOSIS — N183 Chronic kidney disease, stage 3 (moderate): Secondary | ICD-10-CM | POA: Diagnosis not present

## 2019-01-23 DIAGNOSIS — E1122 Type 2 diabetes mellitus with diabetic chronic kidney disease: Secondary | ICD-10-CM | POA: Diagnosis not present

## 2019-01-23 DIAGNOSIS — R2681 Unsteadiness on feet: Secondary | ICD-10-CM | POA: Diagnosis not present

## 2019-01-23 DIAGNOSIS — I87323 Chronic venous hypertension (idiopathic) with inflammation of bilateral lower extremity: Secondary | ICD-10-CM | POA: Diagnosis not present

## 2019-01-23 DIAGNOSIS — S72001D Fracture of unspecified part of neck of right femur, subsequent encounter for closed fracture with routine healing: Secondary | ICD-10-CM | POA: Diagnosis not present

## 2019-01-23 DIAGNOSIS — I129 Hypertensive chronic kidney disease with stage 1 through stage 4 chronic kidney disease, or unspecified chronic kidney disease: Secondary | ICD-10-CM | POA: Diagnosis not present

## 2019-01-23 DIAGNOSIS — Z794 Long term (current) use of insulin: Secondary | ICD-10-CM | POA: Diagnosis not present

## 2019-01-23 DIAGNOSIS — J90 Pleural effusion, not elsewhere classified: Secondary | ICD-10-CM | POA: Diagnosis not present

## 2019-01-23 DIAGNOSIS — E1151 Type 2 diabetes mellitus with diabetic peripheral angiopathy without gangrene: Secondary | ICD-10-CM | POA: Diagnosis not present

## 2019-01-27 ENCOUNTER — Other Ambulatory Visit: Payer: Self-pay

## 2019-01-27 DIAGNOSIS — Z794 Long term (current) use of insulin: Secondary | ICD-10-CM | POA: Diagnosis not present

## 2019-01-27 DIAGNOSIS — J90 Pleural effusion, not elsewhere classified: Secondary | ICD-10-CM | POA: Diagnosis not present

## 2019-01-27 DIAGNOSIS — E1122 Type 2 diabetes mellitus with diabetic chronic kidney disease: Secondary | ICD-10-CM | POA: Diagnosis not present

## 2019-01-27 DIAGNOSIS — N183 Chronic kidney disease, stage 3 (moderate): Secondary | ICD-10-CM | POA: Diagnosis not present

## 2019-01-27 DIAGNOSIS — S72001D Fracture of unspecified part of neck of right femur, subsequent encounter for closed fracture with routine healing: Secondary | ICD-10-CM | POA: Diagnosis not present

## 2019-01-27 DIAGNOSIS — I87323 Chronic venous hypertension (idiopathic) with inflammation of bilateral lower extremity: Secondary | ICD-10-CM | POA: Diagnosis not present

## 2019-01-27 DIAGNOSIS — R2681 Unsteadiness on feet: Secondary | ICD-10-CM | POA: Diagnosis not present

## 2019-01-27 DIAGNOSIS — I70213 Atherosclerosis of native arteries of extremities with intermittent claudication, bilateral legs: Secondary | ICD-10-CM

## 2019-01-27 DIAGNOSIS — I129 Hypertensive chronic kidney disease with stage 1 through stage 4 chronic kidney disease, or unspecified chronic kidney disease: Secondary | ICD-10-CM | POA: Diagnosis not present

## 2019-01-27 DIAGNOSIS — E1151 Type 2 diabetes mellitus with diabetic peripheral angiopathy without gangrene: Secondary | ICD-10-CM | POA: Diagnosis not present

## 2019-01-28 NOTE — Patient Outreach (Signed)
Telephone follow up:  Placed call to patient who answered and reports she is having a bad day. Reports "bad back pain" for unknown reason. Reports no recent fall.  Reports her hip is doing well and she is using her walker. Reports CBG levels are "good" . ( unable to provide numbers)  Reports PT is coming today. Denies any new problems or concerns today.   PLAN: follow up in 2 weeks. Sooner if needed. Encouraged patient to complete her daily home PT exercises and avoid falls.   Tomasa Rand, RN, BSN, CEN [facility] [employer] [phone]

## 2019-01-29 DIAGNOSIS — E1122 Type 2 diabetes mellitus with diabetic chronic kidney disease: Secondary | ICD-10-CM | POA: Diagnosis not present

## 2019-01-29 DIAGNOSIS — N183 Chronic kidney disease, stage 3 (moderate): Secondary | ICD-10-CM | POA: Diagnosis not present

## 2019-01-29 DIAGNOSIS — I87323 Chronic venous hypertension (idiopathic) with inflammation of bilateral lower extremity: Secondary | ICD-10-CM | POA: Diagnosis not present

## 2019-01-29 DIAGNOSIS — S72001D Fracture of unspecified part of neck of right femur, subsequent encounter for closed fracture with routine healing: Secondary | ICD-10-CM | POA: Diagnosis not present

## 2019-01-29 DIAGNOSIS — I129 Hypertensive chronic kidney disease with stage 1 through stage 4 chronic kidney disease, or unspecified chronic kidney disease: Secondary | ICD-10-CM | POA: Diagnosis not present

## 2019-01-29 DIAGNOSIS — Z794 Long term (current) use of insulin: Secondary | ICD-10-CM | POA: Diagnosis not present

## 2019-01-29 DIAGNOSIS — J90 Pleural effusion, not elsewhere classified: Secondary | ICD-10-CM | POA: Diagnosis not present

## 2019-01-29 DIAGNOSIS — R2681 Unsteadiness on feet: Secondary | ICD-10-CM | POA: Diagnosis not present

## 2019-01-29 DIAGNOSIS — E1151 Type 2 diabetes mellitus with diabetic peripheral angiopathy without gangrene: Secondary | ICD-10-CM | POA: Diagnosis not present

## 2019-01-30 DIAGNOSIS — E1151 Type 2 diabetes mellitus with diabetic peripheral angiopathy without gangrene: Secondary | ICD-10-CM | POA: Diagnosis not present

## 2019-01-30 DIAGNOSIS — E1122 Type 2 diabetes mellitus with diabetic chronic kidney disease: Secondary | ICD-10-CM | POA: Diagnosis not present

## 2019-01-30 DIAGNOSIS — Z794 Long term (current) use of insulin: Secondary | ICD-10-CM | POA: Diagnosis not present

## 2019-01-30 DIAGNOSIS — I87323 Chronic venous hypertension (idiopathic) with inflammation of bilateral lower extremity: Secondary | ICD-10-CM | POA: Diagnosis not present

## 2019-01-30 DIAGNOSIS — J90 Pleural effusion, not elsewhere classified: Secondary | ICD-10-CM | POA: Diagnosis not present

## 2019-01-30 DIAGNOSIS — I129 Hypertensive chronic kidney disease with stage 1 through stage 4 chronic kidney disease, or unspecified chronic kidney disease: Secondary | ICD-10-CM | POA: Diagnosis not present

## 2019-01-30 DIAGNOSIS — S72001D Fracture of unspecified part of neck of right femur, subsequent encounter for closed fracture with routine healing: Secondary | ICD-10-CM | POA: Diagnosis not present

## 2019-01-30 DIAGNOSIS — R2681 Unsteadiness on feet: Secondary | ICD-10-CM | POA: Diagnosis not present

## 2019-01-30 DIAGNOSIS — N183 Chronic kidney disease, stage 3 (moderate): Secondary | ICD-10-CM | POA: Diagnosis not present

## 2019-02-02 DIAGNOSIS — E1151 Type 2 diabetes mellitus with diabetic peripheral angiopathy without gangrene: Secondary | ICD-10-CM | POA: Diagnosis not present

## 2019-02-02 DIAGNOSIS — Z794 Long term (current) use of insulin: Secondary | ICD-10-CM | POA: Diagnosis not present

## 2019-02-02 DIAGNOSIS — I87323 Chronic venous hypertension (idiopathic) with inflammation of bilateral lower extremity: Secondary | ICD-10-CM | POA: Diagnosis not present

## 2019-02-02 DIAGNOSIS — S72001D Fracture of unspecified part of neck of right femur, subsequent encounter for closed fracture with routine healing: Secondary | ICD-10-CM | POA: Diagnosis not present

## 2019-02-02 DIAGNOSIS — N183 Chronic kidney disease, stage 3 (moderate): Secondary | ICD-10-CM | POA: Diagnosis not present

## 2019-02-02 DIAGNOSIS — J90 Pleural effusion, not elsewhere classified: Secondary | ICD-10-CM | POA: Diagnosis not present

## 2019-02-02 DIAGNOSIS — E1122 Type 2 diabetes mellitus with diabetic chronic kidney disease: Secondary | ICD-10-CM | POA: Diagnosis not present

## 2019-02-02 DIAGNOSIS — I129 Hypertensive chronic kidney disease with stage 1 through stage 4 chronic kidney disease, or unspecified chronic kidney disease: Secondary | ICD-10-CM | POA: Diagnosis not present

## 2019-02-02 DIAGNOSIS — R2681 Unsteadiness on feet: Secondary | ICD-10-CM | POA: Diagnosis not present

## 2019-02-03 DIAGNOSIS — R2681 Unsteadiness on feet: Secondary | ICD-10-CM | POA: Diagnosis not present

## 2019-02-03 DIAGNOSIS — I129 Hypertensive chronic kidney disease with stage 1 through stage 4 chronic kidney disease, or unspecified chronic kidney disease: Secondary | ICD-10-CM | POA: Diagnosis not present

## 2019-02-03 DIAGNOSIS — Z794 Long term (current) use of insulin: Secondary | ICD-10-CM | POA: Diagnosis not present

## 2019-02-03 DIAGNOSIS — E1151 Type 2 diabetes mellitus with diabetic peripheral angiopathy without gangrene: Secondary | ICD-10-CM | POA: Diagnosis not present

## 2019-02-03 DIAGNOSIS — S72001D Fracture of unspecified part of neck of right femur, subsequent encounter for closed fracture with routine healing: Secondary | ICD-10-CM | POA: Diagnosis not present

## 2019-02-03 DIAGNOSIS — E1122 Type 2 diabetes mellitus with diabetic chronic kidney disease: Secondary | ICD-10-CM | POA: Diagnosis not present

## 2019-02-03 DIAGNOSIS — N183 Chronic kidney disease, stage 3 (moderate): Secondary | ICD-10-CM | POA: Diagnosis not present

## 2019-02-03 DIAGNOSIS — J90 Pleural effusion, not elsewhere classified: Secondary | ICD-10-CM | POA: Diagnosis not present

## 2019-02-03 DIAGNOSIS — I87323 Chronic venous hypertension (idiopathic) with inflammation of bilateral lower extremity: Secondary | ICD-10-CM | POA: Diagnosis not present

## 2019-02-04 DIAGNOSIS — J90 Pleural effusion, not elsewhere classified: Secondary | ICD-10-CM | POA: Diagnosis not present

## 2019-02-04 DIAGNOSIS — R2681 Unsteadiness on feet: Secondary | ICD-10-CM | POA: Diagnosis not present

## 2019-02-04 DIAGNOSIS — N183 Chronic kidney disease, stage 3 (moderate): Secondary | ICD-10-CM | POA: Diagnosis not present

## 2019-02-04 DIAGNOSIS — I129 Hypertensive chronic kidney disease with stage 1 through stage 4 chronic kidney disease, or unspecified chronic kidney disease: Secondary | ICD-10-CM | POA: Diagnosis not present

## 2019-02-04 DIAGNOSIS — E1151 Type 2 diabetes mellitus with diabetic peripheral angiopathy without gangrene: Secondary | ICD-10-CM | POA: Diagnosis not present

## 2019-02-04 DIAGNOSIS — I87323 Chronic venous hypertension (idiopathic) with inflammation of bilateral lower extremity: Secondary | ICD-10-CM | POA: Diagnosis not present

## 2019-02-04 DIAGNOSIS — Z794 Long term (current) use of insulin: Secondary | ICD-10-CM | POA: Diagnosis not present

## 2019-02-04 DIAGNOSIS — E1122 Type 2 diabetes mellitus with diabetic chronic kidney disease: Secondary | ICD-10-CM | POA: Diagnosis not present

## 2019-02-04 DIAGNOSIS — S72001D Fracture of unspecified part of neck of right femur, subsequent encounter for closed fracture with routine healing: Secondary | ICD-10-CM | POA: Diagnosis not present

## 2019-02-06 ENCOUNTER — Telehealth (HOSPITAL_COMMUNITY): Payer: Self-pay | Admitting: Rehabilitation

## 2019-02-06 NOTE — Telephone Encounter (Signed)
The above patient or their representative was contacted and gave the following answers to these questions:         Do you have any of the following symptoms? Pt does experience shortness of breath when walking.  Fever                    Cough                   Shortness of breath  Do  you have any of the following other symptoms? No   muscle pain         vomiting,        diarrhea        rash         weakness        red eye        abdominal pain         bruising          bruising or bleeding              joint pain           severe headache    Have you been in contact with someone who was or has been sick in the past 2 weeks? No  Yes                 Unsure                         Unable to assess   Does the person that you were in contact with have any of the following symptoms?   Cough         shortness of breath           muscle pain         vomiting,            diarrhea            rash            weakness           fever            red eye           abdominal pain           bruising  or  bleeding                joint pain                severe headache               Have you  or someone you have been in contact with traveled internationally in th last month?     No    If yes, which countries?   Have you  or someone you have been in contact with traveled outside [place] in th last month?  No       If yes, which state and city?   COMMENTS OR ACTION PLAN FOR THIS PATIENT:

## 2019-02-09 ENCOUNTER — Ambulatory Visit: Payer: Medicare HMO | Admitting: Surgery

## 2019-02-09 ENCOUNTER — Encounter (HOSPITAL_COMMUNITY): Payer: Medicare HMO

## 2019-02-09 DIAGNOSIS — E1151 Type 2 diabetes mellitus with diabetic peripheral angiopathy without gangrene: Secondary | ICD-10-CM | POA: Diagnosis not present

## 2019-02-09 DIAGNOSIS — J90 Pleural effusion, not elsewhere classified: Secondary | ICD-10-CM | POA: Diagnosis not present

## 2019-02-09 DIAGNOSIS — Z794 Long term (current) use of insulin: Secondary | ICD-10-CM | POA: Diagnosis not present

## 2019-02-09 DIAGNOSIS — I129 Hypertensive chronic kidney disease with stage 1 through stage 4 chronic kidney disease, or unspecified chronic kidney disease: Secondary | ICD-10-CM | POA: Diagnosis not present

## 2019-02-09 DIAGNOSIS — I87323 Chronic venous hypertension (idiopathic) with inflammation of bilateral lower extremity: Secondary | ICD-10-CM | POA: Diagnosis not present

## 2019-02-09 DIAGNOSIS — S72001D Fracture of unspecified part of neck of right femur, subsequent encounter for closed fracture with routine healing: Secondary | ICD-10-CM | POA: Diagnosis not present

## 2019-02-09 DIAGNOSIS — R2681 Unsteadiness on feet: Secondary | ICD-10-CM | POA: Diagnosis not present

## 2019-02-09 DIAGNOSIS — E1122 Type 2 diabetes mellitus with diabetic chronic kidney disease: Secondary | ICD-10-CM | POA: Diagnosis not present

## 2019-02-09 DIAGNOSIS — N183 Chronic kidney disease, stage 3 (moderate): Secondary | ICD-10-CM | POA: Diagnosis not present

## 2019-02-10 ENCOUNTER — Other Ambulatory Visit: Payer: Self-pay

## 2019-02-10 DIAGNOSIS — E039 Hypothyroidism, unspecified: Secondary | ICD-10-CM | POA: Diagnosis not present

## 2019-02-10 DIAGNOSIS — S72001D Fracture of unspecified part of neck of right femur, subsequent encounter for closed fracture with routine healing: Secondary | ICD-10-CM | POA: Diagnosis not present

## 2019-02-10 DIAGNOSIS — E1122 Type 2 diabetes mellitus with diabetic chronic kidney disease: Secondary | ICD-10-CM | POA: Diagnosis not present

## 2019-02-10 DIAGNOSIS — N183 Chronic kidney disease, stage 3 (moderate): Secondary | ICD-10-CM | POA: Diagnosis not present

## 2019-02-10 DIAGNOSIS — J90 Pleural effusion, not elsewhere classified: Secondary | ICD-10-CM | POA: Diagnosis not present

## 2019-02-10 DIAGNOSIS — R2681 Unsteadiness on feet: Secondary | ICD-10-CM | POA: Diagnosis not present

## 2019-02-10 DIAGNOSIS — I10 Essential (primary) hypertension: Secondary | ICD-10-CM | POA: Diagnosis not present

## 2019-02-10 DIAGNOSIS — R262 Difficulty in walking, not elsewhere classified: Secondary | ICD-10-CM | POA: Diagnosis not present

## 2019-02-10 DIAGNOSIS — E1151 Type 2 diabetes mellitus with diabetic peripheral angiopathy without gangrene: Secondary | ICD-10-CM | POA: Diagnosis not present

## 2019-02-10 DIAGNOSIS — R601 Generalized edema: Secondary | ICD-10-CM | POA: Diagnosis not present

## 2019-02-10 DIAGNOSIS — Z794 Long term (current) use of insulin: Secondary | ICD-10-CM | POA: Diagnosis not present

## 2019-02-10 DIAGNOSIS — I129 Hypertensive chronic kidney disease with stage 1 through stage 4 chronic kidney disease, or unspecified chronic kidney disease: Secondary | ICD-10-CM | POA: Diagnosis not present

## 2019-02-10 DIAGNOSIS — I87323 Chronic venous hypertension (idiopathic) with inflammation of bilateral lower extremity: Secondary | ICD-10-CM | POA: Diagnosis not present

## 2019-02-10 NOTE — Patient Outreach (Signed)
Telephone assessment:  Placed call to patient with no answer. No machine.  PLAN: will call back in 24 hours.  Tomasa Rand, RN, BSN, CEN [facility] [employer] [phone]

## 2019-02-11 ENCOUNTER — Other Ambulatory Visit: Payer: Self-pay

## 2019-02-11 NOTE — Patient Outreach (Signed)
Telephone assessment:  Placed call to patient who reports that she has good days and bad days. Reports saw MD yesterday and gabapentin was decreased. Reports DM is the same. CBG yesterday of 188. Denies any recent falls.  PLAN: follow up in 2 weeks. If doing well will close case.  Tomasa Rand, RN, BSN, CEN [facility] [employer] [phone]

## 2019-02-13 ENCOUNTER — Telehealth (HOSPITAL_COMMUNITY): Payer: Self-pay | Admitting: Rehabilitation

## 2019-02-13 DIAGNOSIS — R262 Difficulty in walking, not elsewhere classified: Secondary | ICD-10-CM | POA: Diagnosis not present

## 2019-02-13 NOTE — Telephone Encounter (Signed)
The above patient or their representative was contacted and gave the following answers to these questions:         Do you have any of the following symptoms? No  Fever                    Cough                   Shortness of breath  Do  you have any of the following other symptoms? No   muscle pain         vomiting,        diarrhea        rash         weakness        red eye        abdominal pain         bruising          bruising or bleeding              joint pain           severe headache    Have you been in contact with someone who was or has been sick in the past 2 weeks? No  Yes                 Unsure                         Unable to assess   Does the person that you were in contact with have any of the following symptoms?   Cough         shortness of breath           muscle pain         vomiting,            diarrhea            rash            weakness           fever            red eye           abdominal pain           bruising  or  bleeding                joint pain                severe headache               Have you  or someone you have been in contact with traveled internationally in th last month? No        If yes, which countries?   Have you  or someone you have been in contact with traveled outside [place] in th last month? No         If yes, which state and city?   COMMENTS OR ACTION PLAN FOR THIS PATIENT:          

## 2019-02-14 DIAGNOSIS — R601 Generalized edema: Secondary | ICD-10-CM | POA: Diagnosis not present

## 2019-02-16 ENCOUNTER — Encounter: Payer: Self-pay | Admitting: Surgery

## 2019-02-16 ENCOUNTER — Other Ambulatory Visit: Payer: Self-pay

## 2019-02-16 ENCOUNTER — Ambulatory Visit (INDEPENDENT_AMBULATORY_CARE_PROVIDER_SITE_OTHER)
Admission: RE | Admit: 2019-02-16 | Discharge: 2019-02-16 | Disposition: A | Discharge status: Home / Self Care | Payer: Medicare HMO | Source: Ambulatory Visit | Attending: Surgery | Admitting: Surgery

## 2019-02-16 ENCOUNTER — Ambulatory Visit: Payer: Medicare HMO | Admitting: Surgery

## 2019-02-16 ENCOUNTER — Ambulatory Visit (HOSPITAL_COMMUNITY)
Admission: RE | Admit: 2019-02-16 | Discharge: 2019-02-16 | Disposition: A | Discharge status: Home / Self Care | Payer: Medicare HMO | Source: Ambulatory Visit | Attending: Surgery | Admitting: Surgery

## 2019-02-16 VITALS — BP 155/79 | HR 66 | Resp 20 | Ht 62.0 in | Wt 119.0 lb

## 2019-02-16 DIAGNOSIS — I70213 Atherosclerosis of native arteries of extremities with intermittent claudication, bilateral legs: Secondary | ICD-10-CM

## 2019-02-16 NOTE — Progress Notes (Signed)
[facility] [place] Patient name: [name] MRN: [id number] DOB: Nov 10, 1940 Sex: female   REASON FOR VISIT:    Follow up  HISOTRY OF PRESENT ILLNESS:    Alicia Wall Bullinsis a 78y.o.femalewho is a former patient of Dr. Kellie Simmering. She is status post aortobifemoral bypass graft on 12/21/2010 using a 12 x 7 graft. 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 On 02/29/2012 patient went back to the operating room and had a left femoral endarterectomy with patch angioplasty. I saw her over the summer where she had developed a right medial ankle ulcer. Angiography revealed an occluded right superficial femoral artery. On 07/09/2018 she underwent a right femoral to above-knee popliteal artery bypass graft with reverse saphenous vein.  At her last visit she was complaining of low back pain and right leg pain with tingling from her toes to her knee. She is also having some drainage from her incisions. I opened the seroma from 1 of her vein harvest incisions. Her wound appeared to be getting smaller. She was referred to the wound center.   Her wounds have now healed.  She is still having trouble with her mobility.  Her swelling is better but persists in both legs.  She has not worn compression.  PAST MEDICAL HISTORY:   Past Medical History:  Diagnosis Date  . AAA (abdominal aortic aneurysm) ([place]) 02/26/2012  . Arthritis   . 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 Blood transfusion 2011  . Cancer (HCC)    BREAST  . Claudication of both lower extremities ([place]) 05/28/2017  . COPD (chronic obstructive pulmonary disease) ([place])   . Depression   . Diabetes mellitus    type 2 IDDM x 10 years  . Diabetes mellitus with peripheral vascular disease ([place]) 05/28/2017  . GERD (gastroesophageal reflux disease)   . Hip pain   . Hypertension   .  Hypothyroidism   . Iliac artery occlusion ([place]) 02/26/2012  . Myocardial infarction [facility])    unsure, looks as if she may have had one in the past  . Peripheral vascular disease ([place])   . PVD (peripheral vascular disease) with claudication ([place]) 02/26/2012  . Sleep apnea    does not use cpap   . Type II or unspecified type diabetes mellitus with peripheral circulatory disorders, uncontrolled(250.72) 06/15/2014     FAMILY HISTORY:   Family History  Problem Relation Age of Onset  . Heart disease Mother   . Cancer Father        LUNG  . Cancer Sister        BONE  . Anesthesia problems Neg Hx     SOCIAL HISTORY:   Social History   Tobacco Use  . Smoking status: Current Every Day Smoker    Packs/day: 0.50    Years: 65.00    Pack years: 32.50    Types: Cigarettes  . Smokeless tobacco: Never Used  Substance Use Topics  . Alcohol use: Not Currently    Comment: "up to three weeks ago" 07/04/18     ALLERGIES:   Allergies  Allergen Reactions  . Penicillins Swelling and Rash    Has patient had a PCN reaction causing immediate rash, facial/tongue/throat swelling, SOB or lightheadedness with hypotension: No Has patient had a PCN reaction causing severe rash involving mucus membranes or skin necrosis: No Has patient had a PCN reaction that required hospitalization: No Has patient had a PCN reaction  occurring within the last 10 years:No If all of the above answers are "NO", then may proceed with Cephalosporin use.      CURRENT MEDICATIONS:   Current Outpatient Medications  Medication Sig Dispense Refill  . alendronate (FOSAMAX) 70 MG tablet Take 70 mg by mouth once a week. Take with a full glass of water on an empty stomach.  Takes on Friday    . amLODipine-benazepril (LOTREL) 5-40 MG capsule Take 1 capsule by mouth at bedtime.  5  . aspirin 325 MG tablet Take 325 mg by mouth daily.    Marland Kitchen atenolol (TENORMIN) 100 MG tablet Take 100 mg by mouth daily.    Marland Kitchen atorvastatin (LIPITOR)  40 MG tablet Take 40 mg by mouth at bedtime.  5  . gabapentin (NEURONTIN) 300 MG capsule Take 300 mg by mouth 2 (two) times daily.    . hydrALAZINE (APRESOLINE) 25 MG tablet Take 25 mg by mouth 3 (three) times daily.    . Insulin Glargine (LANTUS SOLOSTAR) 100 UNIT/ML Solostar Pen Inject 20 Units into the skin daily at 10 pm. (Patient taking differently: Inject 10 Units into the skin daily at 10 pm. )    . lansoprazole (PREVACID) 30 MG capsule Take 30 mg by mouth 2 (two) times daily.  5  . levothyroxine (SYNTHROID, LEVOTHROID) 137 MCG tablet Take 137 mcg by mouth daily before breakfast.    . losartan (COZAAR) 100 MG tablet Take 100 mg by mouth daily with breakfast.  0  . nicotine (NICODERM CQ - DOSED IN MG/24 HOURS) 14 mg/24hr patch Place 14 mg onto the skin daily.    Marland Kitchen PARoxetine (PAXIL) 40 MG tablet Take 40 mg by mouth every morning.    . potassium chloride SA (K-DUR,KLOR-CON) 20 MEQ tablet Take 20 mEq by mouth daily.    Marland Kitchen rOPINIRole (REQUIP) 4 MG tablet Take 4 mg by mouth at bedtime.    . torsemide (DEMADEX) 20 MG tablet Take 40 mg by mouth daily.     No current facility-administered medications for this visit.     REVIEW OF SYSTEMS:   [X]  denotes positive finding, [ ]  denotes negative finding Cardiac  Comments:  Chest pain or chest pressure:    Shortness of breath upon exertion:    Short of breath when lying flat:    Irregular heart rhythm:        Vascular    Pain in calf, thigh, or hip brought on by ambulation:    Pain in feet at night that wakes you up from your sleep:     Blood clot in your veins:    Leg swelling:         Pulmonary    Oxygen at home:    Productive cough:     Wheezing:         Neurologic    Sudden weakness in arms or legs:     Sudden numbness in arms or legs:     Sudden onset of difficulty speaking or slurred speech:    Temporary loss of vision in one eye:     Problems with dizziness:         Gastrointestinal    Blood in stool:     Vomited blood:          Genitourinary    Burning when urinating:     Blood in urine:        Psychiatric    Major depression:         Hematologic  Bleeding problems:    Problems with blood clotting too easily:        Skin    Rashes or ulcers:        Constitutional    Fever or chills:      PHYSICAL EXAM:   Vitals:   02/16/19 1107  BP: (!) 155/79  Pulse: 66  Resp: 20  SpO2: 99%  Weight: 54 kg  Height: 5\' 2"  (1.575 m)    GENERAL: The patient is a well-nourished female, in no acute distress. The vital signs are documented above. CARDIAC: There is a regular rate and rhythm.  VASCULAR: Bilateral edema.  No palpable pedal pulses.  Palpable femoral pulse. PULMONARY: Non-labored respirations ABDOMEN: Soft and non-tender with normal pitched bowel sounds.  MUSCULOSKELETAL: There are no major deformities or cyanosis. NEUROLOGIC: No focal weakness or paresthesias are detected. SKIN: There are no ulcers or rashes noted. PSYCHIATRIC: The patient has a normal affect.  STUDIES:   I have reviewed her vascular lab studies with the following findings: ABI/TBIToday's ABIToday's TBIPrevious ABIPrevious TBI +-------+-----------+-----------+------------+------------+ Right  1.08       0.79       1.20        0.78         +-------+-----------+-----------+------------+------------+ Left   0.96       0.60       0.83        0.66         +-------+-----------+-----------+------------+------------+  Right: Patent aorto-bifemoral graft. Patent Right fem-pop bypass graft. Homogenous cystic structure at groin appears larger than previously documented.  Left: 50-74% stenosis noted in the deep femoral artery. 75-99% stenosis noted in the superficial femoral artery. MEDICAL ISSUES:   Overall, the patient is deconditioned.  I have encouraged her to continue with physical therapy and home health to improve her activity level.  I have also recommended that she wear compression stockings to help  with her edema.  The area in the right groin feels like seroma/hematoma.  No indications for surgical evacuation are present.  She will follow-up in 6 months with repeat ABIs and duplex.    Leia Alf, MD, FACS [facility] [phone] Pager [phone]

## 2019-02-17 DIAGNOSIS — Z794 Long term (current) use of insulin: Secondary | ICD-10-CM | POA: Diagnosis not present

## 2019-02-17 DIAGNOSIS — R600 Localized edema: Secondary | ICD-10-CM | POA: Diagnosis not present

## 2019-02-17 DIAGNOSIS — J90 Pleural effusion, not elsewhere classified: Secondary | ICD-10-CM | POA: Diagnosis not present

## 2019-02-17 DIAGNOSIS — N183 Chronic kidney disease, stage 3 (moderate): Secondary | ICD-10-CM | POA: Diagnosis not present

## 2019-02-17 DIAGNOSIS — I87323 Chronic venous hypertension (idiopathic) with inflammation of bilateral lower extremity: Secondary | ICD-10-CM | POA: Diagnosis not present

## 2019-02-17 DIAGNOSIS — S72001D Fracture of unspecified part of neck of right femur, subsequent encounter for closed fracture with routine healing: Secondary | ICD-10-CM | POA: Diagnosis not present

## 2019-02-17 DIAGNOSIS — E039 Hypothyroidism, unspecified: Secondary | ICD-10-CM | POA: Diagnosis not present

## 2019-02-17 DIAGNOSIS — E1151 Type 2 diabetes mellitus with diabetic peripheral angiopathy without gangrene: Secondary | ICD-10-CM | POA: Diagnosis not present

## 2019-02-17 DIAGNOSIS — I129 Hypertensive chronic kidney disease with stage 1 through stage 4 chronic kidney disease, or unspecified chronic kidney disease: Secondary | ICD-10-CM | POA: Diagnosis not present

## 2019-02-17 DIAGNOSIS — E1122 Type 2 diabetes mellitus with diabetic chronic kidney disease: Secondary | ICD-10-CM | POA: Diagnosis not present

## 2019-02-17 DIAGNOSIS — R2681 Unsteadiness on feet: Secondary | ICD-10-CM | POA: Diagnosis not present

## 2019-02-18 DIAGNOSIS — N183 Chronic kidney disease, stage 3 (moderate): Secondary | ICD-10-CM | POA: Diagnosis not present

## 2019-02-18 DIAGNOSIS — R2681 Unsteadiness on feet: Secondary | ICD-10-CM | POA: Diagnosis not present

## 2019-02-18 DIAGNOSIS — I129 Hypertensive chronic kidney disease with stage 1 through stage 4 chronic kidney disease, or unspecified chronic kidney disease: Secondary | ICD-10-CM | POA: Diagnosis not present

## 2019-02-18 DIAGNOSIS — Z794 Long term (current) use of insulin: Secondary | ICD-10-CM | POA: Diagnosis not present

## 2019-02-18 DIAGNOSIS — E1151 Type 2 diabetes mellitus with diabetic peripheral angiopathy without gangrene: Secondary | ICD-10-CM | POA: Diagnosis not present

## 2019-02-18 DIAGNOSIS — S72001D Fracture of unspecified part of neck of right femur, subsequent encounter for closed fracture with routine healing: Secondary | ICD-10-CM | POA: Diagnosis not present

## 2019-02-18 DIAGNOSIS — I87323 Chronic venous hypertension (idiopathic) with inflammation of bilateral lower extremity: Secondary | ICD-10-CM | POA: Diagnosis not present

## 2019-02-18 DIAGNOSIS — E1122 Type 2 diabetes mellitus with diabetic chronic kidney disease: Secondary | ICD-10-CM | POA: Diagnosis not present

## 2019-02-18 DIAGNOSIS — J90 Pleural effusion, not elsewhere classified: Secondary | ICD-10-CM | POA: Diagnosis not present

## 2019-02-19 DIAGNOSIS — E1122 Type 2 diabetes mellitus with diabetic chronic kidney disease: Secondary | ICD-10-CM | POA: Diagnosis not present

## 2019-02-19 DIAGNOSIS — J90 Pleural effusion, not elsewhere classified: Secondary | ICD-10-CM | POA: Diagnosis not present

## 2019-02-19 DIAGNOSIS — R2681 Unsteadiness on feet: Secondary | ICD-10-CM | POA: Diagnosis not present

## 2019-02-19 DIAGNOSIS — E1151 Type 2 diabetes mellitus with diabetic peripheral angiopathy without gangrene: Secondary | ICD-10-CM | POA: Diagnosis not present

## 2019-02-19 DIAGNOSIS — I129 Hypertensive chronic kidney disease with stage 1 through stage 4 chronic kidney disease, or unspecified chronic kidney disease: Secondary | ICD-10-CM | POA: Diagnosis not present

## 2019-02-19 DIAGNOSIS — Z794 Long term (current) use of insulin: Secondary | ICD-10-CM | POA: Diagnosis not present

## 2019-02-19 DIAGNOSIS — S72001D Fracture of unspecified part of neck of right femur, subsequent encounter for closed fracture with routine healing: Secondary | ICD-10-CM | POA: Diagnosis not present

## 2019-02-19 DIAGNOSIS — I87323 Chronic venous hypertension (idiopathic) with inflammation of bilateral lower extremity: Secondary | ICD-10-CM | POA: Diagnosis not present

## 2019-02-19 DIAGNOSIS — N183 Chronic kidney disease, stage 3 (moderate): Secondary | ICD-10-CM | POA: Diagnosis not present

## 2019-02-24 DIAGNOSIS — S72001D Fracture of unspecified part of neck of right femur, subsequent encounter for closed fracture with routine healing: Secondary | ICD-10-CM | POA: Diagnosis not present

## 2019-02-24 DIAGNOSIS — I87323 Chronic venous hypertension (idiopathic) with inflammation of bilateral lower extremity: Secondary | ICD-10-CM | POA: Diagnosis not present

## 2019-02-24 DIAGNOSIS — I129 Hypertensive chronic kidney disease with stage 1 through stage 4 chronic kidney disease, or unspecified chronic kidney disease: Secondary | ICD-10-CM | POA: Diagnosis not present

## 2019-02-24 DIAGNOSIS — R2681 Unsteadiness on feet: Secondary | ICD-10-CM | POA: Diagnosis not present

## 2019-02-24 DIAGNOSIS — E1151 Type 2 diabetes mellitus with diabetic peripheral angiopathy without gangrene: Secondary | ICD-10-CM | POA: Diagnosis not present

## 2019-02-24 DIAGNOSIS — Z794 Long term (current) use of insulin: Secondary | ICD-10-CM | POA: Diagnosis not present

## 2019-02-24 DIAGNOSIS — J90 Pleural effusion, not elsewhere classified: Secondary | ICD-10-CM | POA: Diagnosis not present

## 2019-02-24 DIAGNOSIS — E1122 Type 2 diabetes mellitus with diabetic chronic kidney disease: Secondary | ICD-10-CM | POA: Diagnosis not present

## 2019-02-24 DIAGNOSIS — N183 Chronic kidney disease, stage 3 (moderate): Secondary | ICD-10-CM | POA: Diagnosis not present

## 2019-02-25 ENCOUNTER — Other Ambulatory Visit: Payer: Self-pay

## 2019-02-25 NOTE — Patient Outreach (Signed)
Telephone assessment/ case closure:  Placed call to patient who reports she is still in the bed. Reports she is doing well but continues to have problems with walking. Continues to work with PT and use her walker.   Patient reports her Dm is better. Reports has not checked CBG yet today. Reports CBG reading yesterday of 170.    Denies any recent medications changes.    PLAN: reviewed case closure with patient who agrees she has achieved her goals. Encouraged patient to continue to follow her diet and take her medications as prescribed. Reviewed again when to call MD. Will mail case closure letter to patient and MD.  Tomasa Rand, RN, BSN, CEN [place] Coordinator [phone]

## 2019-02-26 DIAGNOSIS — R2681 Unsteadiness on feet: Secondary | ICD-10-CM | POA: Diagnosis not present

## 2019-02-26 DIAGNOSIS — Z794 Long term (current) use of insulin: Secondary | ICD-10-CM | POA: Diagnosis not present

## 2019-02-26 DIAGNOSIS — E1122 Type 2 diabetes mellitus with diabetic chronic kidney disease: Secondary | ICD-10-CM | POA: Diagnosis not present

## 2019-02-26 DIAGNOSIS — E1151 Type 2 diabetes mellitus with diabetic peripheral angiopathy without gangrene: Secondary | ICD-10-CM | POA: Diagnosis not present

## 2019-02-26 DIAGNOSIS — N183 Chronic kidney disease, stage 3 (moderate): Secondary | ICD-10-CM | POA: Diagnosis not present

## 2019-02-26 DIAGNOSIS — I87323 Chronic venous hypertension (idiopathic) with inflammation of bilateral lower extremity: Secondary | ICD-10-CM | POA: Diagnosis not present

## 2019-02-26 DIAGNOSIS — S72001D Fracture of unspecified part of neck of right femur, subsequent encounter for closed fracture with routine healing: Secondary | ICD-10-CM | POA: Diagnosis not present

## 2019-02-26 DIAGNOSIS — J90 Pleural effusion, not elsewhere classified: Secondary | ICD-10-CM | POA: Diagnosis not present

## 2019-02-26 DIAGNOSIS — I129 Hypertensive chronic kidney disease with stage 1 through stage 4 chronic kidney disease, or unspecified chronic kidney disease: Secondary | ICD-10-CM | POA: Diagnosis not present

## 2019-03-02 DIAGNOSIS — N183 Chronic kidney disease, stage 3 (moderate): Secondary | ICD-10-CM | POA: Diagnosis not present

## 2019-03-02 DIAGNOSIS — E1151 Type 2 diabetes mellitus with diabetic peripheral angiopathy without gangrene: Secondary | ICD-10-CM | POA: Diagnosis not present

## 2019-03-02 DIAGNOSIS — I129 Hypertensive chronic kidney disease with stage 1 through stage 4 chronic kidney disease, or unspecified chronic kidney disease: Secondary | ICD-10-CM | POA: Diagnosis not present

## 2019-03-02 DIAGNOSIS — Z794 Long term (current) use of insulin: Secondary | ICD-10-CM | POA: Diagnosis not present

## 2019-03-02 DIAGNOSIS — S72001D Fracture of unspecified part of neck of right femur, subsequent encounter for closed fracture with routine healing: Secondary | ICD-10-CM | POA: Diagnosis not present

## 2019-03-02 DIAGNOSIS — J90 Pleural effusion, not elsewhere classified: Secondary | ICD-10-CM | POA: Diagnosis not present

## 2019-03-02 DIAGNOSIS — I87323 Chronic venous hypertension (idiopathic) with inflammation of bilateral lower extremity: Secondary | ICD-10-CM | POA: Diagnosis not present

## 2019-03-02 DIAGNOSIS — R2681 Unsteadiness on feet: Secondary | ICD-10-CM | POA: Diagnosis not present

## 2019-03-02 DIAGNOSIS — E1122 Type 2 diabetes mellitus with diabetic chronic kidney disease: Secondary | ICD-10-CM | POA: Diagnosis not present

## 2019-03-03 DIAGNOSIS — E1151 Type 2 diabetes mellitus with diabetic peripheral angiopathy without gangrene: Secondary | ICD-10-CM | POA: Diagnosis not present

## 2019-03-03 DIAGNOSIS — E1122 Type 2 diabetes mellitus with diabetic chronic kidney disease: Secondary | ICD-10-CM | POA: Diagnosis not present

## 2019-03-03 DIAGNOSIS — I129 Hypertensive chronic kidney disease with stage 1 through stage 4 chronic kidney disease, or unspecified chronic kidney disease: Secondary | ICD-10-CM | POA: Diagnosis not present

## 2019-03-03 DIAGNOSIS — I87323 Chronic venous hypertension (idiopathic) with inflammation of bilateral lower extremity: Secondary | ICD-10-CM | POA: Diagnosis not present

## 2019-03-03 DIAGNOSIS — Z794 Long term (current) use of insulin: Secondary | ICD-10-CM | POA: Diagnosis not present

## 2019-03-03 DIAGNOSIS — R2681 Unsteadiness on feet: Secondary | ICD-10-CM | POA: Diagnosis not present

## 2019-03-03 DIAGNOSIS — S72001D Fracture of unspecified part of neck of right femur, subsequent encounter for closed fracture with routine healing: Secondary | ICD-10-CM | POA: Diagnosis not present

## 2019-03-03 DIAGNOSIS — J90 Pleural effusion, not elsewhere classified: Secondary | ICD-10-CM | POA: Diagnosis not present

## 2019-03-03 DIAGNOSIS — N183 Chronic kidney disease, stage 3 (moderate): Secondary | ICD-10-CM | POA: Diagnosis not present

## 2019-03-09 DIAGNOSIS — I129 Hypertensive chronic kidney disease with stage 1 through stage 4 chronic kidney disease, or unspecified chronic kidney disease: Secondary | ICD-10-CM | POA: Diagnosis not present

## 2019-03-09 DIAGNOSIS — R2681 Unsteadiness on feet: Secondary | ICD-10-CM | POA: Diagnosis not present

## 2019-03-09 DIAGNOSIS — S72001D Fracture of unspecified part of neck of right femur, subsequent encounter for closed fracture with routine healing: Secondary | ICD-10-CM | POA: Diagnosis not present

## 2019-03-09 DIAGNOSIS — Z794 Long term (current) use of insulin: Secondary | ICD-10-CM | POA: Diagnosis not present

## 2019-03-09 DIAGNOSIS — E1122 Type 2 diabetes mellitus with diabetic chronic kidney disease: Secondary | ICD-10-CM | POA: Diagnosis not present

## 2019-03-09 DIAGNOSIS — I87323 Chronic venous hypertension (idiopathic) with inflammation of bilateral lower extremity: Secondary | ICD-10-CM | POA: Diagnosis not present

## 2019-03-09 DIAGNOSIS — J90 Pleural effusion, not elsewhere classified: Secondary | ICD-10-CM | POA: Diagnosis not present

## 2019-03-09 DIAGNOSIS — N183 Chronic kidney disease, stage 3 (moderate): Secondary | ICD-10-CM | POA: Diagnosis not present

## 2019-03-09 DIAGNOSIS — E1151 Type 2 diabetes mellitus with diabetic peripheral angiopathy without gangrene: Secondary | ICD-10-CM | POA: Diagnosis not present

## 2019-03-10 DIAGNOSIS — E1140 Type 2 diabetes mellitus with diabetic neuropathy, unspecified: Secondary | ICD-10-CM | POA: Diagnosis not present

## 2019-03-10 DIAGNOSIS — R601 Generalized edema: Secondary | ICD-10-CM | POA: Diagnosis not present

## 2019-03-10 DIAGNOSIS — Z23 Encounter for immunization: Secondary | ICD-10-CM | POA: Diagnosis not present

## 2019-03-10 DIAGNOSIS — E1165 Type 2 diabetes mellitus with hyperglycemia: Secondary | ICD-10-CM | POA: Diagnosis not present

## 2019-03-10 DIAGNOSIS — S81801A Unspecified open wound, right lower leg, initial encounter: Secondary | ICD-10-CM | POA: Diagnosis not present

## 2019-03-10 DIAGNOSIS — E039 Hypothyroidism, unspecified: Secondary | ICD-10-CM | POA: Diagnosis not present

## 2019-03-10 DIAGNOSIS — I10 Essential (primary) hypertension: Secondary | ICD-10-CM | POA: Diagnosis not present

## 2019-03-12 DIAGNOSIS — J90 Pleural effusion, not elsewhere classified: Secondary | ICD-10-CM | POA: Diagnosis not present

## 2019-03-12 DIAGNOSIS — I129 Hypertensive chronic kidney disease with stage 1 through stage 4 chronic kidney disease, or unspecified chronic kidney disease: Secondary | ICD-10-CM | POA: Diagnosis not present

## 2019-03-12 DIAGNOSIS — E875 Hyperkalemia: Secondary | ICD-10-CM | POA: Diagnosis not present

## 2019-03-12 DIAGNOSIS — I87323 Chronic venous hypertension (idiopathic) with inflammation of bilateral lower extremity: Secondary | ICD-10-CM | POA: Diagnosis not present

## 2019-03-12 DIAGNOSIS — Z794 Long term (current) use of insulin: Secondary | ICD-10-CM | POA: Diagnosis not present

## 2019-03-12 DIAGNOSIS — N183 Chronic kidney disease, stage 3 (moderate): Secondary | ICD-10-CM | POA: Diagnosis not present

## 2019-03-12 DIAGNOSIS — E1122 Type 2 diabetes mellitus with diabetic chronic kidney disease: Secondary | ICD-10-CM | POA: Diagnosis not present

## 2019-03-12 DIAGNOSIS — S72001D Fracture of unspecified part of neck of right femur, subsequent encounter for closed fracture with routine healing: Secondary | ICD-10-CM | POA: Diagnosis not present

## 2019-03-12 DIAGNOSIS — E1151 Type 2 diabetes mellitus with diabetic peripheral angiopathy without gangrene: Secondary | ICD-10-CM | POA: Diagnosis not present

## 2019-03-12 DIAGNOSIS — R2681 Unsteadiness on feet: Secondary | ICD-10-CM | POA: Diagnosis not present

## 2019-03-16 DIAGNOSIS — R601 Generalized edema: Secondary | ICD-10-CM | POA: Diagnosis not present

## 2019-03-17 DIAGNOSIS — E1122 Type 2 diabetes mellitus with diabetic chronic kidney disease: Secondary | ICD-10-CM | POA: Diagnosis not present

## 2019-03-17 DIAGNOSIS — E1151 Type 2 diabetes mellitus with diabetic peripheral angiopathy without gangrene: Secondary | ICD-10-CM | POA: Diagnosis not present

## 2019-03-17 DIAGNOSIS — S72001D Fracture of unspecified part of neck of right femur, subsequent encounter for closed fracture with routine healing: Secondary | ICD-10-CM | POA: Diagnosis not present

## 2019-03-17 DIAGNOSIS — J90 Pleural effusion, not elsewhere classified: Secondary | ICD-10-CM | POA: Diagnosis not present

## 2019-03-17 DIAGNOSIS — R2681 Unsteadiness on feet: Secondary | ICD-10-CM | POA: Diagnosis not present

## 2019-03-17 DIAGNOSIS — N183 Chronic kidney disease, stage 3 (moderate): Secondary | ICD-10-CM | POA: Diagnosis not present

## 2019-03-17 DIAGNOSIS — I87323 Chronic venous hypertension (idiopathic) with inflammation of bilateral lower extremity: Secondary | ICD-10-CM | POA: Diagnosis not present

## 2019-03-17 DIAGNOSIS — Z794 Long term (current) use of insulin: Secondary | ICD-10-CM | POA: Diagnosis not present

## 2019-03-17 DIAGNOSIS — I129 Hypertensive chronic kidney disease with stage 1 through stage 4 chronic kidney disease, or unspecified chronic kidney disease: Secondary | ICD-10-CM | POA: Diagnosis not present

## 2019-03-19 DIAGNOSIS — I129 Hypertensive chronic kidney disease with stage 1 through stage 4 chronic kidney disease, or unspecified chronic kidney disease: Secondary | ICD-10-CM | POA: Diagnosis not present

## 2019-03-19 DIAGNOSIS — S72001D Fracture of unspecified part of neck of right femur, subsequent encounter for closed fracture with routine healing: Secondary | ICD-10-CM | POA: Diagnosis not present

## 2019-03-19 DIAGNOSIS — Z794 Long term (current) use of insulin: Secondary | ICD-10-CM | POA: Diagnosis not present

## 2019-03-19 DIAGNOSIS — R2681 Unsteadiness on feet: Secondary | ICD-10-CM | POA: Diagnosis not present

## 2019-03-19 DIAGNOSIS — I87323 Chronic venous hypertension (idiopathic) with inflammation of bilateral lower extremity: Secondary | ICD-10-CM | POA: Diagnosis not present

## 2019-03-19 DIAGNOSIS — J90 Pleural effusion, not elsewhere classified: Secondary | ICD-10-CM | POA: Diagnosis not present

## 2019-03-19 DIAGNOSIS — E1151 Type 2 diabetes mellitus with diabetic peripheral angiopathy without gangrene: Secondary | ICD-10-CM | POA: Diagnosis not present

## 2019-03-19 DIAGNOSIS — E1122 Type 2 diabetes mellitus with diabetic chronic kidney disease: Secondary | ICD-10-CM | POA: Diagnosis not present

## 2019-03-19 DIAGNOSIS — N183 Chronic kidney disease, stage 3 (moderate): Secondary | ICD-10-CM | POA: Diagnosis not present

## 2019-03-23 DIAGNOSIS — R2681 Unsteadiness on feet: Secondary | ICD-10-CM | POA: Diagnosis not present

## 2019-03-23 DIAGNOSIS — E1151 Type 2 diabetes mellitus with diabetic peripheral angiopathy without gangrene: Secondary | ICD-10-CM | POA: Diagnosis not present

## 2019-03-23 DIAGNOSIS — I87323 Chronic venous hypertension (idiopathic) with inflammation of bilateral lower extremity: Secondary | ICD-10-CM | POA: Diagnosis not present

## 2019-03-23 DIAGNOSIS — S72001D Fracture of unspecified part of neck of right femur, subsequent encounter for closed fracture with routine healing: Secondary | ICD-10-CM | POA: Diagnosis not present

## 2019-03-23 DIAGNOSIS — Z794 Long term (current) use of insulin: Secondary | ICD-10-CM | POA: Diagnosis not present

## 2019-03-23 DIAGNOSIS — I129 Hypertensive chronic kidney disease with stage 1 through stage 4 chronic kidney disease, or unspecified chronic kidney disease: Secondary | ICD-10-CM | POA: Diagnosis not present

## 2019-03-23 DIAGNOSIS — E1122 Type 2 diabetes mellitus with diabetic chronic kidney disease: Secondary | ICD-10-CM | POA: Diagnosis not present

## 2019-03-23 DIAGNOSIS — J90 Pleural effusion, not elsewhere classified: Secondary | ICD-10-CM | POA: Diagnosis not present

## 2019-03-23 DIAGNOSIS — N183 Chronic kidney disease, stage 3 (moderate): Secondary | ICD-10-CM | POA: Diagnosis not present

## 2019-03-25 DIAGNOSIS — E1151 Type 2 diabetes mellitus with diabetic peripheral angiopathy without gangrene: Secondary | ICD-10-CM | POA: Diagnosis not present

## 2019-03-25 DIAGNOSIS — I129 Hypertensive chronic kidney disease with stage 1 through stage 4 chronic kidney disease, or unspecified chronic kidney disease: Secondary | ICD-10-CM | POA: Diagnosis not present

## 2019-03-25 DIAGNOSIS — Z794 Long term (current) use of insulin: Secondary | ICD-10-CM | POA: Diagnosis not present

## 2019-03-25 DIAGNOSIS — S72001D Fracture of unspecified part of neck of right femur, subsequent encounter for closed fracture with routine healing: Secondary | ICD-10-CM | POA: Diagnosis not present

## 2019-03-25 DIAGNOSIS — E1122 Type 2 diabetes mellitus with diabetic chronic kidney disease: Secondary | ICD-10-CM | POA: Diagnosis not present

## 2019-03-25 DIAGNOSIS — I87323 Chronic venous hypertension (idiopathic) with inflammation of bilateral lower extremity: Secondary | ICD-10-CM | POA: Diagnosis not present

## 2019-03-25 DIAGNOSIS — R2681 Unsteadiness on feet: Secondary | ICD-10-CM | POA: Diagnosis not present

## 2019-03-25 DIAGNOSIS — J90 Pleural effusion, not elsewhere classified: Secondary | ICD-10-CM | POA: Diagnosis not present

## 2019-03-25 DIAGNOSIS — N183 Chronic kidney disease, stage 3 (moderate): Secondary | ICD-10-CM | POA: Diagnosis not present

## 2019-03-26 DIAGNOSIS — S72001D Fracture of unspecified part of neck of right femur, subsequent encounter for closed fracture with routine healing: Secondary | ICD-10-CM | POA: Diagnosis not present

## 2019-03-26 DIAGNOSIS — J90 Pleural effusion, not elsewhere classified: Secondary | ICD-10-CM | POA: Diagnosis not present

## 2019-03-26 DIAGNOSIS — I129 Hypertensive chronic kidney disease with stage 1 through stage 4 chronic kidney disease, or unspecified chronic kidney disease: Secondary | ICD-10-CM | POA: Diagnosis not present

## 2019-03-26 DIAGNOSIS — I87323 Chronic venous hypertension (idiopathic) with inflammation of bilateral lower extremity: Secondary | ICD-10-CM | POA: Diagnosis not present

## 2019-03-26 DIAGNOSIS — E1122 Type 2 diabetes mellitus with diabetic chronic kidney disease: Secondary | ICD-10-CM | POA: Diagnosis not present

## 2019-03-26 DIAGNOSIS — Z794 Long term (current) use of insulin: Secondary | ICD-10-CM | POA: Diagnosis not present

## 2019-03-26 DIAGNOSIS — E1151 Type 2 diabetes mellitus with diabetic peripheral angiopathy without gangrene: Secondary | ICD-10-CM | POA: Diagnosis not present

## 2019-03-26 DIAGNOSIS — N183 Chronic kidney disease, stage 3 (moderate): Secondary | ICD-10-CM | POA: Diagnosis not present

## 2019-03-26 DIAGNOSIS — R2681 Unsteadiness on feet: Secondary | ICD-10-CM | POA: Diagnosis not present

## 2019-03-30 DIAGNOSIS — S72001D Fracture of unspecified part of neck of right femur, subsequent encounter for closed fracture with routine healing: Secondary | ICD-10-CM | POA: Diagnosis not present

## 2019-03-30 DIAGNOSIS — E1151 Type 2 diabetes mellitus with diabetic peripheral angiopathy without gangrene: Secondary | ICD-10-CM | POA: Diagnosis not present

## 2019-03-30 DIAGNOSIS — E1122 Type 2 diabetes mellitus with diabetic chronic kidney disease: Secondary | ICD-10-CM | POA: Diagnosis not present

## 2019-03-30 DIAGNOSIS — I129 Hypertensive chronic kidney disease with stage 1 through stage 4 chronic kidney disease, or unspecified chronic kidney disease: Secondary | ICD-10-CM | POA: Diagnosis not present

## 2019-03-30 DIAGNOSIS — Z794 Long term (current) use of insulin: Secondary | ICD-10-CM | POA: Diagnosis not present

## 2019-03-30 DIAGNOSIS — J90 Pleural effusion, not elsewhere classified: Secondary | ICD-10-CM | POA: Diagnosis not present

## 2019-03-30 DIAGNOSIS — R2681 Unsteadiness on feet: Secondary | ICD-10-CM | POA: Diagnosis not present

## 2019-03-30 DIAGNOSIS — I87323 Chronic venous hypertension (idiopathic) with inflammation of bilateral lower extremity: Secondary | ICD-10-CM | POA: Diagnosis not present

## 2019-03-30 DIAGNOSIS — N183 Chronic kidney disease, stage 3 (moderate): Secondary | ICD-10-CM | POA: Diagnosis not present

## 2019-03-31 DIAGNOSIS — N183 Chronic kidney disease, stage 3 (moderate): Secondary | ICD-10-CM | POA: Diagnosis not present

## 2019-03-31 DIAGNOSIS — R2681 Unsteadiness on feet: Secondary | ICD-10-CM | POA: Diagnosis not present

## 2019-03-31 DIAGNOSIS — I87323 Chronic venous hypertension (idiopathic) with inflammation of bilateral lower extremity: Secondary | ICD-10-CM | POA: Diagnosis not present

## 2019-03-31 DIAGNOSIS — E1122 Type 2 diabetes mellitus with diabetic chronic kidney disease: Secondary | ICD-10-CM | POA: Diagnosis not present

## 2019-03-31 DIAGNOSIS — Z794 Long term (current) use of insulin: Secondary | ICD-10-CM | POA: Diagnosis not present

## 2019-03-31 DIAGNOSIS — E1151 Type 2 diabetes mellitus with diabetic peripheral angiopathy without gangrene: Secondary | ICD-10-CM | POA: Diagnosis not present

## 2019-03-31 DIAGNOSIS — J90 Pleural effusion, not elsewhere classified: Secondary | ICD-10-CM | POA: Diagnosis not present

## 2019-03-31 DIAGNOSIS — S72001D Fracture of unspecified part of neck of right femur, subsequent encounter for closed fracture with routine healing: Secondary | ICD-10-CM | POA: Diagnosis not present

## 2019-03-31 DIAGNOSIS — I129 Hypertensive chronic kidney disease with stage 1 through stage 4 chronic kidney disease, or unspecified chronic kidney disease: Secondary | ICD-10-CM | POA: Diagnosis not present

## 2019-04-01 DIAGNOSIS — E1151 Type 2 diabetes mellitus with diabetic peripheral angiopathy without gangrene: Secondary | ICD-10-CM | POA: Diagnosis not present

## 2019-04-01 DIAGNOSIS — J90 Pleural effusion, not elsewhere classified: Secondary | ICD-10-CM | POA: Diagnosis not present

## 2019-04-01 DIAGNOSIS — N183 Chronic kidney disease, stage 3 (moderate): Secondary | ICD-10-CM | POA: Diagnosis not present

## 2019-04-01 DIAGNOSIS — R2681 Unsteadiness on feet: Secondary | ICD-10-CM | POA: Diagnosis not present

## 2019-04-01 DIAGNOSIS — I129 Hypertensive chronic kidney disease with stage 1 through stage 4 chronic kidney disease, or unspecified chronic kidney disease: Secondary | ICD-10-CM | POA: Diagnosis not present

## 2019-04-01 DIAGNOSIS — I87323 Chronic venous hypertension (idiopathic) with inflammation of bilateral lower extremity: Secondary | ICD-10-CM | POA: Diagnosis not present

## 2019-04-01 DIAGNOSIS — Z794 Long term (current) use of insulin: Secondary | ICD-10-CM | POA: Diagnosis not present

## 2019-04-01 DIAGNOSIS — E1122 Type 2 diabetes mellitus with diabetic chronic kidney disease: Secondary | ICD-10-CM | POA: Diagnosis not present

## 2019-04-01 DIAGNOSIS — S72001D Fracture of unspecified part of neck of right femur, subsequent encounter for closed fracture with routine healing: Secondary | ICD-10-CM | POA: Diagnosis not present

## 2019-04-06 DIAGNOSIS — E1151 Type 2 diabetes mellitus with diabetic peripheral angiopathy without gangrene: Secondary | ICD-10-CM | POA: Diagnosis not present

## 2019-04-06 DIAGNOSIS — S72001D Fracture of unspecified part of neck of right femur, subsequent encounter for closed fracture with routine healing: Secondary | ICD-10-CM | POA: Diagnosis not present

## 2019-04-06 DIAGNOSIS — Z794 Long term (current) use of insulin: Secondary | ICD-10-CM | POA: Diagnosis not present

## 2019-04-06 DIAGNOSIS — N183 Chronic kidney disease, stage 3 (moderate): Secondary | ICD-10-CM | POA: Diagnosis not present

## 2019-04-06 DIAGNOSIS — J90 Pleural effusion, not elsewhere classified: Secondary | ICD-10-CM | POA: Diagnosis not present

## 2019-04-06 DIAGNOSIS — I129 Hypertensive chronic kidney disease with stage 1 through stage 4 chronic kidney disease, or unspecified chronic kidney disease: Secondary | ICD-10-CM | POA: Diagnosis not present

## 2019-04-06 DIAGNOSIS — E1122 Type 2 diabetes mellitus with diabetic chronic kidney disease: Secondary | ICD-10-CM | POA: Diagnosis not present

## 2019-04-06 DIAGNOSIS — R2681 Unsteadiness on feet: Secondary | ICD-10-CM | POA: Diagnosis not present

## 2019-04-06 DIAGNOSIS — I87323 Chronic venous hypertension (idiopathic) with inflammation of bilateral lower extremity: Secondary | ICD-10-CM | POA: Diagnosis not present

## 2019-04-07 DIAGNOSIS — E039 Hypothyroidism, unspecified: Secondary | ICD-10-CM | POA: Diagnosis not present

## 2019-04-07 DIAGNOSIS — I10 Essential (primary) hypertension: Secondary | ICD-10-CM | POA: Diagnosis not present

## 2019-04-07 DIAGNOSIS — F419 Anxiety disorder, unspecified: Secondary | ICD-10-CM | POA: Diagnosis not present

## 2019-04-07 DIAGNOSIS — E1140 Type 2 diabetes mellitus with diabetic neuropathy, unspecified: Secondary | ICD-10-CM | POA: Diagnosis not present

## 2019-04-07 DIAGNOSIS — R601 Generalized edema: Secondary | ICD-10-CM | POA: Diagnosis not present

## 2019-04-07 DIAGNOSIS — E785 Hyperlipidemia, unspecified: Secondary | ICD-10-CM | POA: Diagnosis not present

## 2019-04-07 DIAGNOSIS — R600 Localized edema: Secondary | ICD-10-CM | POA: Diagnosis not present

## 2019-04-07 DIAGNOSIS — E1165 Type 2 diabetes mellitus with hyperglycemia: Secondary | ICD-10-CM | POA: Diagnosis not present

## 2019-04-08 DIAGNOSIS — J90 Pleural effusion, not elsewhere classified: Secondary | ICD-10-CM | POA: Diagnosis not present

## 2019-04-08 DIAGNOSIS — I87323 Chronic venous hypertension (idiopathic) with inflammation of bilateral lower extremity: Secondary | ICD-10-CM | POA: Diagnosis not present

## 2019-04-08 DIAGNOSIS — N183 Chronic kidney disease, stage 3 (moderate): Secondary | ICD-10-CM | POA: Diagnosis not present

## 2019-04-08 DIAGNOSIS — I129 Hypertensive chronic kidney disease with stage 1 through stage 4 chronic kidney disease, or unspecified chronic kidney disease: Secondary | ICD-10-CM | POA: Diagnosis not present

## 2019-04-08 DIAGNOSIS — Z794 Long term (current) use of insulin: Secondary | ICD-10-CM | POA: Diagnosis not present

## 2019-04-08 DIAGNOSIS — E1122 Type 2 diabetes mellitus with diabetic chronic kidney disease: Secondary | ICD-10-CM | POA: Diagnosis not present

## 2019-04-08 DIAGNOSIS — E1151 Type 2 diabetes mellitus with diabetic peripheral angiopathy without gangrene: Secondary | ICD-10-CM | POA: Diagnosis not present

## 2019-04-08 DIAGNOSIS — S72001D Fracture of unspecified part of neck of right femur, subsequent encounter for closed fracture with routine healing: Secondary | ICD-10-CM | POA: Diagnosis not present

## 2019-04-08 DIAGNOSIS — R2681 Unsteadiness on feet: Secondary | ICD-10-CM | POA: Diagnosis not present

## 2019-04-13 DIAGNOSIS — Z794 Long term (current) use of insulin: Secondary | ICD-10-CM | POA: Diagnosis not present

## 2019-04-13 DIAGNOSIS — R2681 Unsteadiness on feet: Secondary | ICD-10-CM | POA: Diagnosis not present

## 2019-04-13 DIAGNOSIS — J90 Pleural effusion, not elsewhere classified: Secondary | ICD-10-CM | POA: Diagnosis not present

## 2019-04-13 DIAGNOSIS — E1122 Type 2 diabetes mellitus with diabetic chronic kidney disease: Secondary | ICD-10-CM | POA: Diagnosis not present

## 2019-04-13 DIAGNOSIS — I129 Hypertensive chronic kidney disease with stage 1 through stage 4 chronic kidney disease, or unspecified chronic kidney disease: Secondary | ICD-10-CM | POA: Diagnosis not present

## 2019-04-13 DIAGNOSIS — S72001D Fracture of unspecified part of neck of right femur, subsequent encounter for closed fracture with routine healing: Secondary | ICD-10-CM | POA: Diagnosis not present

## 2019-04-13 DIAGNOSIS — E1151 Type 2 diabetes mellitus with diabetic peripheral angiopathy without gangrene: Secondary | ICD-10-CM | POA: Diagnosis not present

## 2019-04-13 DIAGNOSIS — I87323 Chronic venous hypertension (idiopathic) with inflammation of bilateral lower extremity: Secondary | ICD-10-CM | POA: Diagnosis not present

## 2019-04-13 DIAGNOSIS — N183 Chronic kidney disease, stage 3 (moderate): Secondary | ICD-10-CM | POA: Diagnosis not present

## 2019-04-14 DIAGNOSIS — S72001D Fracture of unspecified part of neck of right femur, subsequent encounter for closed fracture with routine healing: Secondary | ICD-10-CM | POA: Diagnosis not present

## 2019-04-14 DIAGNOSIS — R2681 Unsteadiness on feet: Secondary | ICD-10-CM | POA: Diagnosis not present

## 2019-04-14 DIAGNOSIS — I87323 Chronic venous hypertension (idiopathic) with inflammation of bilateral lower extremity: Secondary | ICD-10-CM | POA: Diagnosis not present

## 2019-04-14 DIAGNOSIS — E1151 Type 2 diabetes mellitus with diabetic peripheral angiopathy without gangrene: Secondary | ICD-10-CM | POA: Diagnosis not present

## 2019-04-14 DIAGNOSIS — E1122 Type 2 diabetes mellitus with diabetic chronic kidney disease: Secondary | ICD-10-CM | POA: Diagnosis not present

## 2019-04-14 DIAGNOSIS — N183 Chronic kidney disease, stage 3 (moderate): Secondary | ICD-10-CM | POA: Diagnosis not present

## 2019-04-14 DIAGNOSIS — I129 Hypertensive chronic kidney disease with stage 1 through stage 4 chronic kidney disease, or unspecified chronic kidney disease: Secondary | ICD-10-CM | POA: Diagnosis not present

## 2019-04-14 DIAGNOSIS — Z794 Long term (current) use of insulin: Secondary | ICD-10-CM | POA: Diagnosis not present

## 2019-04-14 DIAGNOSIS — J90 Pleural effusion, not elsewhere classified: Secondary | ICD-10-CM | POA: Diagnosis not present

## 2019-04-16 DIAGNOSIS — R601 Generalized edema: Secondary | ICD-10-CM | POA: Diagnosis not present

## 2019-04-17 DIAGNOSIS — E1122 Type 2 diabetes mellitus with diabetic chronic kidney disease: Secondary | ICD-10-CM | POA: Diagnosis not present

## 2019-04-17 DIAGNOSIS — I129 Hypertensive chronic kidney disease with stage 1 through stage 4 chronic kidney disease, or unspecified chronic kidney disease: Secondary | ICD-10-CM | POA: Diagnosis not present

## 2019-04-17 DIAGNOSIS — J90 Pleural effusion, not elsewhere classified: Secondary | ICD-10-CM | POA: Diagnosis not present

## 2019-04-17 DIAGNOSIS — E1151 Type 2 diabetes mellitus with diabetic peripheral angiopathy without gangrene: Secondary | ICD-10-CM | POA: Diagnosis not present

## 2019-04-17 DIAGNOSIS — R2681 Unsteadiness on feet: Secondary | ICD-10-CM | POA: Diagnosis not present

## 2019-04-17 DIAGNOSIS — I87323 Chronic venous hypertension (idiopathic) with inflammation of bilateral lower extremity: Secondary | ICD-10-CM | POA: Diagnosis not present

## 2019-04-17 DIAGNOSIS — S72001D Fracture of unspecified part of neck of right femur, subsequent encounter for closed fracture with routine healing: Secondary | ICD-10-CM | POA: Diagnosis not present

## 2019-04-17 DIAGNOSIS — Z794 Long term (current) use of insulin: Secondary | ICD-10-CM | POA: Diagnosis not present

## 2019-04-17 DIAGNOSIS — N183 Chronic kidney disease, stage 3 (moderate): Secondary | ICD-10-CM | POA: Diagnosis not present

## 2019-04-21 DIAGNOSIS — Z794 Long term (current) use of insulin: Secondary | ICD-10-CM | POA: Diagnosis not present

## 2019-04-21 DIAGNOSIS — J90 Pleural effusion, not elsewhere classified: Secondary | ICD-10-CM | POA: Diagnosis not present

## 2019-04-21 DIAGNOSIS — N183 Chronic kidney disease, stage 3 (moderate): Secondary | ICD-10-CM | POA: Diagnosis not present

## 2019-04-21 DIAGNOSIS — I129 Hypertensive chronic kidney disease with stage 1 through stage 4 chronic kidney disease, or unspecified chronic kidney disease: Secondary | ICD-10-CM | POA: Diagnosis not present

## 2019-04-21 DIAGNOSIS — R2681 Unsteadiness on feet: Secondary | ICD-10-CM | POA: Diagnosis not present

## 2019-04-21 DIAGNOSIS — I87323 Chronic venous hypertension (idiopathic) with inflammation of bilateral lower extremity: Secondary | ICD-10-CM | POA: Diagnosis not present

## 2019-04-21 DIAGNOSIS — S72001D Fracture of unspecified part of neck of right femur, subsequent encounter for closed fracture with routine healing: Secondary | ICD-10-CM | POA: Diagnosis not present

## 2019-04-21 DIAGNOSIS — E1122 Type 2 diabetes mellitus with diabetic chronic kidney disease: Secondary | ICD-10-CM | POA: Diagnosis not present

## 2019-04-21 DIAGNOSIS — E1151 Type 2 diabetes mellitus with diabetic peripheral angiopathy without gangrene: Secondary | ICD-10-CM | POA: Diagnosis not present

## 2019-04-28 DIAGNOSIS — I129 Hypertensive chronic kidney disease with stage 1 through stage 4 chronic kidney disease, or unspecified chronic kidney disease: Secondary | ICD-10-CM | POA: Diagnosis not present

## 2019-04-28 DIAGNOSIS — R2681 Unsteadiness on feet: Secondary | ICD-10-CM | POA: Diagnosis not present

## 2019-04-28 DIAGNOSIS — N183 Chronic kidney disease, stage 3 (moderate): Secondary | ICD-10-CM | POA: Diagnosis not present

## 2019-04-28 DIAGNOSIS — Z794 Long term (current) use of insulin: Secondary | ICD-10-CM | POA: Diagnosis not present

## 2019-04-28 DIAGNOSIS — E1151 Type 2 diabetes mellitus with diabetic peripheral angiopathy without gangrene: Secondary | ICD-10-CM | POA: Diagnosis not present

## 2019-04-28 DIAGNOSIS — J90 Pleural effusion, not elsewhere classified: Secondary | ICD-10-CM | POA: Diagnosis not present

## 2019-04-28 DIAGNOSIS — E1122 Type 2 diabetes mellitus with diabetic chronic kidney disease: Secondary | ICD-10-CM | POA: Diagnosis not present

## 2019-04-28 DIAGNOSIS — S72001D Fracture of unspecified part of neck of right femur, subsequent encounter for closed fracture with routine healing: Secondary | ICD-10-CM | POA: Diagnosis not present

## 2019-04-28 DIAGNOSIS — I87323 Chronic venous hypertension (idiopathic) with inflammation of bilateral lower extremity: Secondary | ICD-10-CM | POA: Diagnosis not present

## 2019-04-29 DIAGNOSIS — E1122 Type 2 diabetes mellitus with diabetic chronic kidney disease: Secondary | ICD-10-CM | POA: Diagnosis not present

## 2019-04-29 DIAGNOSIS — N183 Chronic kidney disease, stage 3 (moderate): Secondary | ICD-10-CM | POA: Diagnosis not present

## 2019-04-29 DIAGNOSIS — I87323 Chronic venous hypertension (idiopathic) with inflammation of bilateral lower extremity: Secondary | ICD-10-CM | POA: Diagnosis not present

## 2019-04-29 DIAGNOSIS — I129 Hypertensive chronic kidney disease with stage 1 through stage 4 chronic kidney disease, or unspecified chronic kidney disease: Secondary | ICD-10-CM | POA: Diagnosis not present

## 2019-04-29 DIAGNOSIS — S72001D Fracture of unspecified part of neck of right femur, subsequent encounter for closed fracture with routine healing: Secondary | ICD-10-CM | POA: Diagnosis not present

## 2019-04-29 DIAGNOSIS — R2681 Unsteadiness on feet: Secondary | ICD-10-CM | POA: Diagnosis not present

## 2019-04-29 DIAGNOSIS — J90 Pleural effusion, not elsewhere classified: Secondary | ICD-10-CM | POA: Diagnosis not present

## 2019-04-29 DIAGNOSIS — E1151 Type 2 diabetes mellitus with diabetic peripheral angiopathy without gangrene: Secondary | ICD-10-CM | POA: Diagnosis not present

## 2019-04-29 DIAGNOSIS — Z794 Long term (current) use of insulin: Secondary | ICD-10-CM | POA: Diagnosis not present

## 2019-04-30 DIAGNOSIS — S72001D Fracture of unspecified part of neck of right femur, subsequent encounter for closed fracture with routine healing: Secondary | ICD-10-CM | POA: Diagnosis not present

## 2019-04-30 DIAGNOSIS — I87323 Chronic venous hypertension (idiopathic) with inflammation of bilateral lower extremity: Secondary | ICD-10-CM | POA: Diagnosis not present

## 2019-04-30 DIAGNOSIS — I129 Hypertensive chronic kidney disease with stage 1 through stage 4 chronic kidney disease, or unspecified chronic kidney disease: Secondary | ICD-10-CM | POA: Diagnosis not present

## 2019-04-30 DIAGNOSIS — R2681 Unsteadiness on feet: Secondary | ICD-10-CM | POA: Diagnosis not present

## 2019-04-30 DIAGNOSIS — J90 Pleural effusion, not elsewhere classified: Secondary | ICD-10-CM | POA: Diagnosis not present

## 2019-04-30 DIAGNOSIS — E1122 Type 2 diabetes mellitus with diabetic chronic kidney disease: Secondary | ICD-10-CM | POA: Diagnosis not present

## 2019-04-30 DIAGNOSIS — E1151 Type 2 diabetes mellitus with diabetic peripheral angiopathy without gangrene: Secondary | ICD-10-CM | POA: Diagnosis not present

## 2019-04-30 DIAGNOSIS — N183 Chronic kidney disease, stage 3 (moderate): Secondary | ICD-10-CM | POA: Diagnosis not present

## 2019-04-30 DIAGNOSIS — Z794 Long term (current) use of insulin: Secondary | ICD-10-CM | POA: Diagnosis not present

## 2019-05-04 DIAGNOSIS — R2681 Unsteadiness on feet: Secondary | ICD-10-CM | POA: Diagnosis not present

## 2019-05-04 DIAGNOSIS — Z794 Long term (current) use of insulin: Secondary | ICD-10-CM | POA: Diagnosis not present

## 2019-05-04 DIAGNOSIS — N183 Chronic kidney disease, stage 3 (moderate): Secondary | ICD-10-CM | POA: Diagnosis not present

## 2019-05-04 DIAGNOSIS — I87323 Chronic venous hypertension (idiopathic) with inflammation of bilateral lower extremity: Secondary | ICD-10-CM | POA: Diagnosis not present

## 2019-05-04 DIAGNOSIS — I129 Hypertensive chronic kidney disease with stage 1 through stage 4 chronic kidney disease, or unspecified chronic kidney disease: Secondary | ICD-10-CM | POA: Diagnosis not present

## 2019-05-04 DIAGNOSIS — J90 Pleural effusion, not elsewhere classified: Secondary | ICD-10-CM | POA: Diagnosis not present

## 2019-05-04 DIAGNOSIS — E1122 Type 2 diabetes mellitus with diabetic chronic kidney disease: Secondary | ICD-10-CM | POA: Diagnosis not present

## 2019-05-04 DIAGNOSIS — S72001D Fracture of unspecified part of neck of right femur, subsequent encounter for closed fracture with routine healing: Secondary | ICD-10-CM | POA: Diagnosis not present

## 2019-05-04 DIAGNOSIS — E1151 Type 2 diabetes mellitus with diabetic peripheral angiopathy without gangrene: Secondary | ICD-10-CM | POA: Diagnosis not present

## 2019-05-05 DIAGNOSIS — R2681 Unsteadiness on feet: Secondary | ICD-10-CM | POA: Diagnosis not present

## 2019-05-05 DIAGNOSIS — I87323 Chronic venous hypertension (idiopathic) with inflammation of bilateral lower extremity: Secondary | ICD-10-CM | POA: Diagnosis not present

## 2019-05-05 DIAGNOSIS — E1122 Type 2 diabetes mellitus with diabetic chronic kidney disease: Secondary | ICD-10-CM | POA: Diagnosis not present

## 2019-05-05 DIAGNOSIS — E1151 Type 2 diabetes mellitus with diabetic peripheral angiopathy without gangrene: Secondary | ICD-10-CM | POA: Diagnosis not present

## 2019-05-05 DIAGNOSIS — N183 Chronic kidney disease, stage 3 (moderate): Secondary | ICD-10-CM | POA: Diagnosis not present

## 2019-05-05 DIAGNOSIS — Z794 Long term (current) use of insulin: Secondary | ICD-10-CM | POA: Diagnosis not present

## 2019-05-05 DIAGNOSIS — S72001D Fracture of unspecified part of neck of right femur, subsequent encounter for closed fracture with routine healing: Secondary | ICD-10-CM | POA: Diagnosis not present

## 2019-05-05 DIAGNOSIS — I129 Hypertensive chronic kidney disease with stage 1 through stage 4 chronic kidney disease, or unspecified chronic kidney disease: Secondary | ICD-10-CM | POA: Diagnosis not present

## 2019-05-05 DIAGNOSIS — J90 Pleural effusion, not elsewhere classified: Secondary | ICD-10-CM | POA: Diagnosis not present

## 2019-05-12 DIAGNOSIS — Z794 Long term (current) use of insulin: Secondary | ICD-10-CM | POA: Diagnosis not present

## 2019-05-12 DIAGNOSIS — J90 Pleural effusion, not elsewhere classified: Secondary | ICD-10-CM | POA: Diagnosis not present

## 2019-05-12 DIAGNOSIS — E1122 Type 2 diabetes mellitus with diabetic chronic kidney disease: Secondary | ICD-10-CM | POA: Diagnosis not present

## 2019-05-12 DIAGNOSIS — R2681 Unsteadiness on feet: Secondary | ICD-10-CM | POA: Diagnosis not present

## 2019-05-12 DIAGNOSIS — E1151 Type 2 diabetes mellitus with diabetic peripheral angiopathy without gangrene: Secondary | ICD-10-CM | POA: Diagnosis not present

## 2019-05-12 DIAGNOSIS — S72001D Fracture of unspecified part of neck of right femur, subsequent encounter for closed fracture with routine healing: Secondary | ICD-10-CM | POA: Diagnosis not present

## 2019-05-12 DIAGNOSIS — I87323 Chronic venous hypertension (idiopathic) with inflammation of bilateral lower extremity: Secondary | ICD-10-CM | POA: Diagnosis not present

## 2019-05-12 DIAGNOSIS — I129 Hypertensive chronic kidney disease with stage 1 through stage 4 chronic kidney disease, or unspecified chronic kidney disease: Secondary | ICD-10-CM | POA: Diagnosis not present

## 2019-05-12 DIAGNOSIS — N183 Chronic kidney disease, stage 3 (moderate): Secondary | ICD-10-CM | POA: Diagnosis not present

## 2019-05-16 DIAGNOSIS — R601 Generalized edema: Secondary | ICD-10-CM | POA: Diagnosis not present

## 2019-06-02 DIAGNOSIS — E11628 Type 2 diabetes mellitus with other skin complications: Secondary | ICD-10-CM | POA: Diagnosis not present

## 2019-06-02 DIAGNOSIS — M7989 Other specified soft tissue disorders: Secondary | ICD-10-CM | POA: Diagnosis not present

## 2019-06-02 DIAGNOSIS — M25522 Pain in left elbow: Secondary | ICD-10-CM | POA: Diagnosis not present

## 2019-06-02 DIAGNOSIS — L089 Local infection of the skin and subcutaneous tissue, unspecified: Secondary | ICD-10-CM | POA: Diagnosis not present

## 2019-06-02 DIAGNOSIS — L97319 Non-pressure chronic ulcer of right ankle with unspecified severity: Secondary | ICD-10-CM | POA: Diagnosis not present

## 2019-06-02 DIAGNOSIS — S59902A Unspecified injury of left elbow, initial encounter: Secondary | ICD-10-CM | POA: Diagnosis not present

## 2019-06-04 ENCOUNTER — Other Ambulatory Visit: Payer: Self-pay

## 2019-06-05 ENCOUNTER — Other Ambulatory Visit: Payer: Self-pay | Admitting: Sports Medicine

## 2019-06-05 ENCOUNTER — Encounter: Payer: Self-pay | Admitting: Sports Medicine

## 2019-06-05 ENCOUNTER — Ambulatory Visit (INDEPENDENT_AMBULATORY_CARE_PROVIDER_SITE_OTHER): Payer: Medicare HMO | Admitting: Sports Medicine

## 2019-06-05 ENCOUNTER — Other Ambulatory Visit: Payer: Self-pay

## 2019-06-05 DIAGNOSIS — I739 Peripheral vascular disease, unspecified: Secondary | ICD-10-CM

## 2019-06-05 DIAGNOSIS — L97319 Non-pressure chronic ulcer of right ankle with unspecified severity: Secondary | ICD-10-CM | POA: Diagnosis not present

## 2019-06-05 DIAGNOSIS — L97311 Non-pressure chronic ulcer of right ankle limited to breakdown of skin: Secondary | ICD-10-CM

## 2019-06-05 DIAGNOSIS — Z87891 Personal history of nicotine dependence: Secondary | ICD-10-CM

## 2019-06-05 DIAGNOSIS — I83013 Varicose veins of right lower extremity with ulcer of ankle: Secondary | ICD-10-CM

## 2019-06-05 DIAGNOSIS — E1151 Type 2 diabetes mellitus with diabetic peripheral angiopathy without gangrene: Secondary | ICD-10-CM

## 2019-06-05 NOTE — Progress Notes (Signed)
Subjective: [name] is a 78 y.o. female patient seen in office for evaluation of ulceration of the right medial ankle.  Patient has a history of diabetes and a blood glucose level not recorded.  Patient is assisted by her son who reports that patient is noncompliant does not check her blood sugars sometimes her sugars have been so high that he can you register on the meter reports that she is also a chain smoker and does not desire to quit and has been told for many years that if she continues then she may be at risk of losing her legs reports that she has had a previous history of ulcerations before that have healed however this ulceration that she currently has at the right foot at the medial ankle has been present for about a week was started on clindamycin by PCP and went for x-rays and recommended that she follow-up here.  Patient denies any increase drainage warmth or odor from the area admits that there is a little bit of redness but otherwise does not bother her.  No other pedal complaints noted at this time.  Review of Systems  All other systems reviewed and are negative.   Patient Active Problem List   Diagnosis Date Noted  . Pressure injury of toe of left foot, stage 1 09/25/2018  . PAD (peripheral artery disease) ([place]) 07/09/2018  . Essential hypertension 06/26/2018  . Hyperlipidemia 06/26/2018  . 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 Claudication of both lower extremities ([place]) 05/28/2017  . Diabetes mellitus with peripheral vascular disease ([place]) 05/28/2017  . Type 2 diabetes mellitus with complication, with long-term current use of insulin ([place]) 06/15/2014  . AAA (abdominal aortic aneurysm) ([place]) 02/26/2012  . PVD (peripheral vascular disease) with claudication ([place]) 02/26/2012  . Iliac artery occlusion (HCC) 02/26/2012   Current Outpatient Medications on File Prior to Visit  Medication Sig Dispense  Refill  . ACCU-CHEK AVIVA PLUS test strip     . Accu-Chek FastClix Lancets MISC     . alendronate (FOSAMAX) 70 MG tablet Take 70 mg by mouth once a week. Take with a full glass of water on an empty stomach.  Takes on Friday    . amLODipine-benazepril (LOTREL) 5-40 MG capsule Take 1 capsule by mouth at bedtime.  5  . anastrozole (ARIMIDEX) 1 MG tablet     . aspirin 325 MG tablet Take 325 mg by mouth daily.    Marland Kitchen atenolol (TENORMIN) 100 MG tablet Take 100 mg by mouth daily.    Marland Kitchen atorvastatin (LIPITOR) 40 MG tablet Take 40 mg by mouth at bedtime.  5  . gabapentin (NEURONTIN) 300 MG capsule Take 300 mg by mouth 2 (two) times daily.    . hydrALAZINE (APRESOLINE) 25 MG tablet Take 25 mg by mouth 3 (three) times daily.    . Insulin Glargine (LANTUS SOLOSTAR) 100 UNIT/ML Solostar Pen Inject 20 Units into the skin daily at 10 pm. (Patient taking differently: Inject 10 Units into the skin daily at 10 pm. )    . lansoprazole (PREVACID) 30 MG capsule Take 30 mg by mouth 2 (two) times daily.  5  . levothyroxine (SYNTHROID, LEVOTHROID) 137 MCG tablet Take 137 mcg by mouth daily before breakfast.    . losartan (COZAAR) 100 MG tablet Take 100 mg by mouth daily with breakfast.  0  . nicotine (NICODERM CQ - DOSED IN MG/24 HOURS) 14 mg/24hr patch Place 14 mg onto  the skin daily.    Marland Kitchen PARoxetine (PAXIL) 40 MG tablet Take 40 mg by mouth every morning.    . potassium chloride SA (K-DUR,KLOR-CON) 20 MEQ tablet Take 20 mEq by mouth daily.    Marland Kitchen rOPINIRole (REQUIP) 4 MG tablet Take 4 mg by mouth at bedtime.    . torsemide (DEMADEX) 20 MG tablet Take 40 mg by mouth daily.    Marland Kitchen UNIFINE PENTIPS 31G X 5 MM MISC      No current facility-administered medications on file prior to visit.    Allergies  Allergen Reactions  . Penicillins Swelling and Rash    Has patient had a PCN reaction causing immediate rash, facial/tongue/throat swelling, SOB or lightheadedness with hypotension: No Has patient had a PCN reaction causing  severe rash involving mucus membranes or skin necrosis: No Has patient had a PCN reaction that required hospitalization: No Has patient had a PCN reaction occurring within the last 10 years:No If all of the above answers are "NO", then may proceed with Cephalosporin use.     No results found for this or any previous visit (from the past 2160 hour(s)).  Objective: There were no vitals filed for this visit.  General: Patient is awake, alert, oriented x 3 and in no acute distress.  Dermatology: Skin is warm and dry bilateral with a partial thickness ulceration present right medial ankle. Ulceration measures 0.3 cm x 0.2 cm x 0.1 cm. There is a granular base.  The ulceration does not  probe to bone and is very superficial. There is no malodor, no active drainage, blanchable erythema, 1+ pitting edema bilateral. No other acute signs of infection.   Vascular: Dorsalis Pedis pulse = 0/4 Bilateral,  Posterior Tibial pulse = 0/4 Bilateral,  Capillary Fill Time < 5 seconds due to pitting edema bilateral  Neurologic: Protective sensation severely diminished bilateral.  Musculosketal:  No Pain with palpation to ulcerated area. No pain with compression to calves bilateral. No symptomatic gross bony deformities noted bilateral.  Xrays, reviewed from [place] of the right foot that was taken on 06/02/2019 revealing evidence of degenerative changes however bone infection could not be excluded due to patient history of ulceration.  No results for input(s): GRAMSTAIN, LABORGA in the last 8760 hours.  Assessment and Plan:  Problem List Items Addressed This Visit      Cardiovascular and Mediastinum   Diabetes mellitus with peripheral vascular disease ([place])   PAD (peripheral artery disease) ([place])    Other Visit Diagnoses    Venous stasis ulcer of ankle, right ([place])    -  Primary   History of smoking           -Examined patient and discussed the progression of the wound and treatment  alternatives. -Xrays reviewed and now that I am able to correlate clinically I do not believe this is any signs of infection likely this is a venous stasis ulcer that is superficial with no deep or soft tissue involvement -Cleanse ulceration -Applied triple antibiotic cream and dry sterile dressing and instructed patient to continue with daily dressings at home consisting of the same daily at least for the next week -Continue with clindamycin liquid till completed as given by PCP -Encourage patient to stop smoking and to elevate her legs to assist with edema control and to get better control over her diabetes and she further discuss this with her PCP on how to properly manage this - Advised patient to go to the ER or return to  office if the wound worsens or if constitutional symptoms are present. -Patient to return to office if wound fails to heal or worsens or sooner if problems arise.  Landis Martins, DPM

## 2019-06-13 DIAGNOSIS — E871 Hypo-osmolality and hyponatremia: Secondary | ICD-10-CM | POA: Diagnosis not present

## 2019-06-13 DIAGNOSIS — S59902A Unspecified injury of left elbow, initial encounter: Secondary | ICD-10-CM | POA: Diagnosis not present

## 2019-06-13 DIAGNOSIS — E162 Hypoglycemia, unspecified: Secondary | ICD-10-CM | POA: Diagnosis not present

## 2019-06-13 DIAGNOSIS — M25522 Pain in left elbow: Secondary | ICD-10-CM | POA: Diagnosis not present

## 2019-06-13 DIAGNOSIS — R4182 Altered mental status, unspecified: Secondary | ICD-10-CM | POA: Diagnosis not present

## 2019-06-13 DIAGNOSIS — Z9119 Patient's noncompliance with other medical treatment and regimen: Secondary | ICD-10-CM | POA: Diagnosis not present

## 2019-06-13 DIAGNOSIS — R404 Transient alteration of awareness: Secondary | ICD-10-CM | POA: Diagnosis not present

## 2019-06-13 DIAGNOSIS — R339 Retention of urine, unspecified: Secondary | ICD-10-CM | POA: Diagnosis not present

## 2019-06-13 DIAGNOSIS — E11649 Type 2 diabetes mellitus with hypoglycemia without coma: Secondary | ICD-10-CM | POA: Diagnosis not present

## 2019-06-13 DIAGNOSIS — Z7289 Other problems related to lifestyle: Secondary | ICD-10-CM | POA: Diagnosis not present

## 2019-06-13 DIAGNOSIS — R918 Other nonspecific abnormal finding of lung field: Secondary | ICD-10-CM | POA: Diagnosis not present

## 2019-06-13 DIAGNOSIS — F17210 Nicotine dependence, cigarettes, uncomplicated: Secondary | ICD-10-CM | POA: Diagnosis not present

## 2019-06-13 DIAGNOSIS — G9341 Metabolic encephalopathy: Secondary | ICD-10-CM | POA: Diagnosis not present

## 2019-06-13 DIAGNOSIS — L0390 Cellulitis, unspecified: Secondary | ICD-10-CM | POA: Diagnosis not present

## 2019-06-13 DIAGNOSIS — T68XXXA Hypothermia, initial encounter: Secondary | ICD-10-CM | POA: Diagnosis not present

## 2019-06-13 DIAGNOSIS — R680 Hypothermia, not associated with low environmental temperature: Secondary | ICD-10-CM | POA: Diagnosis not present

## 2019-06-13 DIAGNOSIS — M7989 Other specified soft tissue disorders: Secondary | ICD-10-CM | POA: Diagnosis not present

## 2019-06-13 DIAGNOSIS — E161 Other hypoglycemia: Secondary | ICD-10-CM | POA: Diagnosis not present

## 2019-06-13 DIAGNOSIS — K861 Other chronic pancreatitis: Secondary | ICD-10-CM | POA: Diagnosis not present

## 2019-06-14 DIAGNOSIS — R600 Localized edema: Secondary | ICD-10-CM | POA: Diagnosis not present

## 2019-06-14 DIAGNOSIS — K861 Other chronic pancreatitis: Secondary | ICD-10-CM | POA: Diagnosis not present

## 2019-06-14 DIAGNOSIS — R4182 Altered mental status, unspecified: Secondary | ICD-10-CM | POA: Diagnosis not present

## 2019-06-14 DIAGNOSIS — T68XXXA Hypothermia, initial encounter: Secondary | ICD-10-CM | POA: Diagnosis not present

## 2019-06-15 DIAGNOSIS — R600 Localized edema: Secondary | ICD-10-CM | POA: Diagnosis not present

## 2019-06-15 DIAGNOSIS — Z9119 Patient's noncompliance with other medical treatment and regimen: Secondary | ICD-10-CM | POA: Diagnosis not present

## 2019-06-15 DIAGNOSIS — Z7289 Other problems related to lifestyle: Secondary | ICD-10-CM | POA: Diagnosis not present

## 2019-06-15 DIAGNOSIS — E039 Hypothyroidism, unspecified: Secondary | ICD-10-CM | POA: Diagnosis not present

## 2019-06-15 DIAGNOSIS — M79632 Pain in left forearm: Secondary | ICD-10-CM | POA: Diagnosis not present

## 2019-06-15 DIAGNOSIS — G9341 Metabolic encephalopathy: Secondary | ICD-10-CM | POA: Diagnosis not present

## 2019-06-15 DIAGNOSIS — T68XXXA Hypothermia, initial encounter: Secondary | ICD-10-CM | POA: Diagnosis not present

## 2019-06-15 DIAGNOSIS — R4182 Altered mental status, unspecified: Secondary | ICD-10-CM | POA: Diagnosis not present

## 2019-06-15 DIAGNOSIS — I10 Essential (primary) hypertension: Secondary | ICD-10-CM | POA: Diagnosis not present

## 2019-06-15 DIAGNOSIS — K861 Other chronic pancreatitis: Secondary | ICD-10-CM | POA: Diagnosis not present

## 2019-06-15 DIAGNOSIS — E119 Type 2 diabetes mellitus without complications: Secondary | ICD-10-CM | POA: Diagnosis not present

## 2019-06-15 DIAGNOSIS — E871 Hypo-osmolality and hyponatremia: Secondary | ICD-10-CM | POA: Diagnosis not present

## 2019-06-15 DIAGNOSIS — R680 Hypothermia, not associated with low environmental temperature: Secondary | ICD-10-CM | POA: Diagnosis not present

## 2019-06-15 DIAGNOSIS — M7989 Other specified soft tissue disorders: Secondary | ICD-10-CM | POA: Diagnosis not present

## 2019-06-15 DIAGNOSIS — E11649 Type 2 diabetes mellitus with hypoglycemia without coma: Secondary | ICD-10-CM | POA: Diagnosis not present

## 2019-06-15 DIAGNOSIS — F17210 Nicotine dependence, cigarettes, uncomplicated: Secondary | ICD-10-CM | POA: Diagnosis not present

## 2019-06-15 DIAGNOSIS — R601 Generalized edema: Secondary | ICD-10-CM | POA: Diagnosis not present

## 2019-06-17 DIAGNOSIS — R296 Repeated falls: Secondary | ICD-10-CM | POA: Diagnosis not present

## 2019-06-17 DIAGNOSIS — F1010 Alcohol abuse, uncomplicated: Secondary | ICD-10-CM | POA: Diagnosis not present

## 2019-06-17 DIAGNOSIS — I10 Essential (primary) hypertension: Secondary | ICD-10-CM | POA: Diagnosis not present

## 2019-06-17 DIAGNOSIS — E039 Hypothyroidism, unspecified: Secondary | ICD-10-CM | POA: Diagnosis not present

## 2019-06-17 DIAGNOSIS — E11649 Type 2 diabetes mellitus with hypoglycemia without coma: Secondary | ICD-10-CM | POA: Diagnosis not present

## 2019-06-17 DIAGNOSIS — S61512D Laceration without foreign body of left wrist, subsequent encounter: Secondary | ICD-10-CM | POA: Diagnosis not present

## 2019-06-17 DIAGNOSIS — K861 Other chronic pancreatitis: Secondary | ICD-10-CM | POA: Diagnosis not present

## 2019-06-17 DIAGNOSIS — E860 Dehydration: Secondary | ICD-10-CM | POA: Diagnosis not present

## 2019-06-17 DIAGNOSIS — F17210 Nicotine dependence, cigarettes, uncomplicated: Secondary | ICD-10-CM | POA: Diagnosis not present

## 2019-06-19 DIAGNOSIS — M79661 Pain in right lower leg: Secondary | ICD-10-CM | POA: Diagnosis not present

## 2019-06-19 DIAGNOSIS — J90 Pleural effusion, not elsewhere classified: Secondary | ICD-10-CM | POA: Diagnosis not present

## 2019-06-19 DIAGNOSIS — I10 Essential (primary) hypertension: Secondary | ICD-10-CM | POA: Diagnosis not present

## 2019-06-19 DIAGNOSIS — J9601 Acute respiratory failure with hypoxia: Secondary | ICD-10-CM | POA: Diagnosis not present

## 2019-06-19 DIAGNOSIS — J339 Nasal polyp, unspecified: Secondary | ICD-10-CM | POA: Diagnosis not present

## 2019-06-19 DIAGNOSIS — J690 Pneumonitis due to inhalation of food and vomit: Secondary | ICD-10-CM | POA: Diagnosis not present

## 2019-06-19 DIAGNOSIS — R0902 Hypoxemia: Secondary | ICD-10-CM | POA: Diagnosis not present

## 2019-06-19 DIAGNOSIS — I110 Hypertensive heart disease with heart failure: Secondary | ICD-10-CM | POA: Diagnosis not present

## 2019-06-19 DIAGNOSIS — E871 Hypo-osmolality and hyponatremia: Secondary | ICD-10-CM | POA: Diagnosis not present

## 2019-06-19 DIAGNOSIS — F17210 Nicotine dependence, cigarettes, uncomplicated: Secondary | ICD-10-CM | POA: Diagnosis not present

## 2019-06-19 DIAGNOSIS — T8140XA Infection following a procedure, unspecified, initial encounter: Secondary | ICD-10-CM | POA: Diagnosis not present

## 2019-06-19 DIAGNOSIS — K219 Gastro-esophageal reflux disease without esophagitis: Secondary | ICD-10-CM | POA: Diagnosis not present

## 2019-06-19 DIAGNOSIS — R296 Repeated falls: Secondary | ICD-10-CM | POA: Diagnosis not present

## 2019-06-19 DIAGNOSIS — M7989 Other specified soft tissue disorders: Secondary | ICD-10-CM | POA: Diagnosis not present

## 2019-06-19 DIAGNOSIS — E039 Hypothyroidism, unspecified: Secondary | ICD-10-CM | POA: Diagnosis not present

## 2019-06-19 DIAGNOSIS — E1165 Type 2 diabetes mellitus with hyperglycemia: Secondary | ICD-10-CM | POA: Diagnosis not present

## 2019-06-19 DIAGNOSIS — S72042A Displaced fracture of base of neck of left femur, initial encounter for closed fracture: Secondary | ICD-10-CM | POA: Diagnosis not present

## 2019-06-19 DIAGNOSIS — I252 Old myocardial infarction: Secondary | ICD-10-CM | POA: Diagnosis not present

## 2019-06-19 DIAGNOSIS — W19XXXA Unspecified fall, initial encounter: Secondary | ICD-10-CM | POA: Diagnosis not present

## 2019-06-19 DIAGNOSIS — R52 Pain, unspecified: Secondary | ICD-10-CM | POA: Diagnosis not present

## 2019-06-19 DIAGNOSIS — A419 Sepsis, unspecified organism: Secondary | ICD-10-CM | POA: Diagnosis not present

## 2019-06-19 DIAGNOSIS — E860 Dehydration: Secondary | ICD-10-CM | POA: Diagnosis not present

## 2019-06-19 DIAGNOSIS — E118 Type 2 diabetes mellitus with unspecified complications: Secondary | ICD-10-CM | POA: Diagnosis not present

## 2019-06-19 DIAGNOSIS — M25552 Pain in left hip: Secondary | ICD-10-CM | POA: Diagnosis not present

## 2019-06-19 DIAGNOSIS — E440 Moderate protein-calorie malnutrition: Secondary | ICD-10-CM | POA: Diagnosis not present

## 2019-06-19 DIAGNOSIS — E11649 Type 2 diabetes mellitus with hypoglycemia without coma: Secondary | ICD-10-CM | POA: Diagnosis not present

## 2019-06-19 DIAGNOSIS — I361 Nonrheumatic tricuspid (valve) insufficiency: Secondary | ICD-10-CM | POA: Diagnosis not present

## 2019-06-19 DIAGNOSIS — E038 Other specified hypothyroidism: Secondary | ICD-10-CM | POA: Diagnosis not present

## 2019-06-19 DIAGNOSIS — K861 Other chronic pancreatitis: Secondary | ICD-10-CM | POA: Diagnosis not present

## 2019-06-19 DIAGNOSIS — E119 Type 2 diabetes mellitus without complications: Secondary | ICD-10-CM | POA: Diagnosis not present

## 2019-06-19 DIAGNOSIS — S61512D Laceration without foreign body of left wrist, subsequent encounter: Secondary | ICD-10-CM | POA: Diagnosis not present

## 2019-06-19 DIAGNOSIS — E1140 Type 2 diabetes mellitus with diabetic neuropathy, unspecified: Secondary | ICD-10-CM | POA: Diagnosis not present

## 2019-06-19 DIAGNOSIS — R4182 Altered mental status, unspecified: Secondary | ICD-10-CM | POA: Diagnosis not present

## 2019-06-19 DIAGNOSIS — M79662 Pain in left lower leg: Secondary | ICD-10-CM | POA: Diagnosis not present

## 2019-06-19 DIAGNOSIS — F1010 Alcohol abuse, uncomplicated: Secondary | ICD-10-CM | POA: Diagnosis not present

## 2019-06-19 DIAGNOSIS — E785 Hyperlipidemia, unspecified: Secondary | ICD-10-CM | POA: Diagnosis not present

## 2019-06-19 DIAGNOSIS — Z96642 Presence of left artificial hip joint: Secondary | ICD-10-CM | POA: Diagnosis not present

## 2019-06-19 DIAGNOSIS — R601 Generalized edema: Secondary | ICD-10-CM | POA: Diagnosis not present

## 2019-06-19 DIAGNOSIS — S72092A Other fracture of head and neck of left femur, initial encounter for closed fracture: Secondary | ICD-10-CM | POA: Diagnosis not present

## 2019-06-19 DIAGNOSIS — Z471 Aftercare following joint replacement surgery: Secondary | ICD-10-CM | POA: Diagnosis not present

## 2019-06-19 DIAGNOSIS — I2699 Other pulmonary embolism without acute cor pulmonale: Secondary | ICD-10-CM | POA: Diagnosis not present

## 2019-06-19 DIAGNOSIS — M1612 Unilateral primary osteoarthritis, left hip: Secondary | ICD-10-CM | POA: Diagnosis not present

## 2019-06-19 DIAGNOSIS — Z9119 Patient's noncompliance with other medical treatment and regimen: Secondary | ICD-10-CM | POA: Diagnosis not present

## 2019-06-19 DIAGNOSIS — R0602 Shortness of breath: Secondary | ICD-10-CM | POA: Diagnosis not present

## 2019-06-19 DIAGNOSIS — R918 Other nonspecific abnormal finding of lung field: Secondary | ICD-10-CM | POA: Diagnosis not present

## 2019-06-19 DIAGNOSIS — M84652A Pathological fracture in other disease, left femur, initial encounter for fracture: Secondary | ICD-10-CM | POA: Diagnosis not present

## 2019-06-19 DIAGNOSIS — N390 Urinary tract infection, site not specified: Secondary | ICD-10-CM | POA: Diagnosis not present

## 2019-06-19 DIAGNOSIS — S72002A Fracture of unspecified part of neck of left femur, initial encounter for closed fracture: Secondary | ICD-10-CM | POA: Diagnosis not present

## 2019-06-23 DIAGNOSIS — I110 Hypertensive heart disease with heart failure: Secondary | ICD-10-CM

## 2019-06-25 DIAGNOSIS — J90 Pleural effusion, not elsewhere classified: Secondary | ICD-10-CM

## 2019-06-25 DIAGNOSIS — I361 Nonrheumatic tricuspid (valve) insufficiency: Secondary | ICD-10-CM

## 2019-06-29 ENCOUNTER — Inpatient Hospital Stay (HOSPITAL_COMMUNITY): Payer: Medicare HMO

## 2019-06-29 ENCOUNTER — Inpatient Hospital Stay (HOSPITAL_COMMUNITY)
Admission: AD | Admit: 2019-06-29 | Discharge: 2019-07-23 | DRG: 813 | Disposition: E | Payer: Medicare HMO | Source: Other Acute Inpatient Hospital | Attending: Pulmonary Disease | Admitting: Pulmonary Disease

## 2019-06-29 DIAGNOSIS — I252 Old myocardial infarction: Secondary | ICD-10-CM

## 2019-06-29 DIAGNOSIS — E039 Hypothyroidism, unspecified: Secondary | ICD-10-CM | POA: Diagnosis not present

## 2019-06-29 DIAGNOSIS — J9691 Respiratory failure, unspecified with hypoxia: Secondary | ICD-10-CM

## 2019-06-29 DIAGNOSIS — I110 Hypertensive heart disease with heart failure: Secondary | ICD-10-CM | POA: Diagnosis present

## 2019-06-29 DIAGNOSIS — N179 Acute kidney failure, unspecified: Secondary | ICD-10-CM

## 2019-06-29 DIAGNOSIS — D696 Thrombocytopenia, unspecified: Secondary | ICD-10-CM | POA: Diagnosis not present

## 2019-06-29 DIAGNOSIS — K219 Gastro-esophageal reflux disease without esophagitis: Secondary | ICD-10-CM | POA: Diagnosis not present

## 2019-06-29 DIAGNOSIS — F329 Major depressive disorder, single episode, unspecified: Secondary | ICD-10-CM | POA: Diagnosis present

## 2019-06-29 DIAGNOSIS — Z9911 Dependence on respirator [ventilator] status: Secondary | ICD-10-CM | POA: Diagnosis not present

## 2019-06-29 DIAGNOSIS — J90 Pleural effusion, not elsewhere classified: Secondary | ICD-10-CM | POA: Diagnosis not present

## 2019-06-29 DIAGNOSIS — E43 Unspecified severe protein-calorie malnutrition: Secondary | ICD-10-CM | POA: Insufficient documentation

## 2019-06-29 DIAGNOSIS — E872 Acidosis: Secondary | ICD-10-CM | POA: Diagnosis not present

## 2019-06-29 DIAGNOSIS — T68XXXA Hypothermia, initial encounter: Secondary | ICD-10-CM

## 2019-06-29 DIAGNOSIS — F419 Anxiety disorder, unspecified: Secondary | ICD-10-CM | POA: Diagnosis present

## 2019-06-29 DIAGNOSIS — J9811 Atelectasis: Secondary | ICD-10-CM | POA: Diagnosis not present

## 2019-06-29 DIAGNOSIS — E11649 Type 2 diabetes mellitus with hypoglycemia without coma: Secondary | ICD-10-CM | POA: Diagnosis not present

## 2019-06-29 DIAGNOSIS — F17210 Nicotine dependence, cigarettes, uncomplicated: Secondary | ICD-10-CM | POA: Diagnosis not present

## 2019-06-29 DIAGNOSIS — I5032 Chronic diastolic (congestive) heart failure: Secondary | ICD-10-CM | POA: Diagnosis not present

## 2019-06-29 DIAGNOSIS — R000 Tachycardia, unspecified: Secondary | ICD-10-CM | POA: Diagnosis not present

## 2019-06-29 DIAGNOSIS — K7290 Hepatic failure, unspecified without coma: Secondary | ICD-10-CM | POA: Diagnosis not present

## 2019-06-29 DIAGNOSIS — R4020 Unspecified coma: Secondary | ICD-10-CM | POA: Diagnosis not present

## 2019-06-29 DIAGNOSIS — G4730 Sleep apnea, unspecified: Secondary | ICD-10-CM | POA: Diagnosis present

## 2019-06-29 DIAGNOSIS — Z853 Personal history of malignant neoplasm of breast: Secondary | ICD-10-CM | POA: Diagnosis not present

## 2019-06-29 DIAGNOSIS — R579 Shock, unspecified: Secondary | ICD-10-CM

## 2019-06-29 DIAGNOSIS — D689 Coagulation defect, unspecified: Secondary | ICD-10-CM

## 2019-06-29 DIAGNOSIS — E119 Type 2 diabetes mellitus without complications: Secondary | ICD-10-CM | POA: Diagnosis not present

## 2019-06-29 DIAGNOSIS — R188 Other ascites: Secondary | ICD-10-CM | POA: Diagnosis not present

## 2019-06-29 DIAGNOSIS — I714 Abdominal aortic aneurysm, without rupture: Secondary | ICD-10-CM | POA: Diagnosis present

## 2019-06-29 DIAGNOSIS — J9601 Acute respiratory failure with hypoxia: Secondary | ICD-10-CM | POA: Diagnosis not present

## 2019-06-29 DIAGNOSIS — E8770 Fluid overload, unspecified: Secondary | ICD-10-CM | POA: Diagnosis not present

## 2019-06-29 DIAGNOSIS — Z880 Allergy status to penicillin: Secondary | ICD-10-CM

## 2019-06-29 DIAGNOSIS — J449 Chronic obstructive pulmonary disease, unspecified: Secondary | ICD-10-CM | POA: Diagnosis present

## 2019-06-29 DIAGNOSIS — R571 Hypovolemic shock: Secondary | ICD-10-CM | POA: Diagnosis present

## 2019-06-29 DIAGNOSIS — L8990 Pressure ulcer of unspecified site, unspecified stage: Secondary | ICD-10-CM | POA: Insufficient documentation

## 2019-06-29 DIAGNOSIS — E46 Unspecified protein-calorie malnutrition: Secondary | ICD-10-CM | POA: Diagnosis not present

## 2019-06-29 DIAGNOSIS — Z6824 Body mass index (BMI) 24.0-24.9, adult: Secondary | ICD-10-CM | POA: Diagnosis not present

## 2019-06-29 DIAGNOSIS — K921 Melena: Secondary | ICD-10-CM | POA: Diagnosis present

## 2019-06-29 DIAGNOSIS — R680 Hypothermia, not associated with low environmental temperature: Secondary | ICD-10-CM | POA: Diagnosis present

## 2019-06-29 DIAGNOSIS — Z978 Presence of other specified devices: Secondary | ICD-10-CM | POA: Diagnosis not present

## 2019-06-29 DIAGNOSIS — D62 Acute posthemorrhagic anemia: Secondary | ICD-10-CM | POA: Diagnosis present

## 2019-06-29 DIAGNOSIS — D65 Disseminated intravascular coagulation [defibrination syndrome]: Principal | ICD-10-CM | POA: Diagnosis present

## 2019-06-29 DIAGNOSIS — S72002A Fracture of unspecified part of neck of left femur, initial encounter for closed fracture: Secondary | ICD-10-CM | POA: Diagnosis not present

## 2019-06-29 DIAGNOSIS — Z4682 Encounter for fitting and adjustment of non-vascular catheter: Secondary | ICD-10-CM | POA: Diagnosis not present

## 2019-06-29 DIAGNOSIS — R578 Other shock: Secondary | ICD-10-CM | POA: Diagnosis not present

## 2019-06-29 DIAGNOSIS — E1151 Type 2 diabetes mellitus with diabetic peripheral angiopathy without gangrene: Secondary | ICD-10-CM | POA: Diagnosis not present

## 2019-06-29 DIAGNOSIS — J9690 Respiratory failure, unspecified, unspecified whether with hypoxia or hypercapnia: Secondary | ICD-10-CM | POA: Diagnosis not present

## 2019-06-29 DIAGNOSIS — Z9289 Personal history of other medical treatment: Secondary | ICD-10-CM

## 2019-06-29 DIAGNOSIS — R34 Anuria and oliguria: Secondary | ICD-10-CM | POA: Diagnosis present

## 2019-06-29 DIAGNOSIS — G40909 Epilepsy, unspecified, not intractable, without status epilepticus: Secondary | ICD-10-CM | POA: Diagnosis not present

## 2019-06-29 DIAGNOSIS — Z833 Family history of diabetes mellitus: Secondary | ICD-10-CM

## 2019-06-29 DIAGNOSIS — R601 Generalized edema: Secondary | ICD-10-CM | POA: Diagnosis not present

## 2019-06-29 DIAGNOSIS — Z7189 Other specified counseling: Secondary | ICD-10-CM | POA: Diagnosis not present

## 2019-06-29 DIAGNOSIS — Z66 Do not resuscitate: Secondary | ICD-10-CM | POA: Diagnosis not present

## 2019-06-29 DIAGNOSIS — Z8249 Family history of ischemic heart disease and other diseases of the circulatory system: Secondary | ICD-10-CM

## 2019-06-29 DIAGNOSIS — Z7901 Long term (current) use of anticoagulants: Secondary | ICD-10-CM

## 2019-06-29 DIAGNOSIS — N170 Acute kidney failure with tubular necrosis: Secondary | ICD-10-CM | POA: Diagnosis not present

## 2019-06-29 DIAGNOSIS — Z515 Encounter for palliative care: Secondary | ICD-10-CM | POA: Diagnosis not present

## 2019-06-29 DIAGNOSIS — J984 Other disorders of lung: Secondary | ICD-10-CM | POA: Diagnosis not present

## 2019-06-29 DIAGNOSIS — Z9119 Patient's noncompliance with other medical treatment and regimen: Secondary | ICD-10-CM | POA: Diagnosis not present

## 2019-06-29 DIAGNOSIS — Z7982 Long term (current) use of aspirin: Secondary | ICD-10-CM

## 2019-06-29 DIAGNOSIS — Z452 Encounter for adjustment and management of vascular access device: Secondary | ICD-10-CM | POA: Diagnosis not present

## 2019-06-29 DIAGNOSIS — Z79890 Hormone replacement therapy (postmenopausal): Secondary | ICD-10-CM

## 2019-06-29 DIAGNOSIS — J9600 Acute respiratory failure, unspecified whether with hypoxia or hypercapnia: Secondary | ICD-10-CM | POA: Diagnosis not present

## 2019-06-29 DIAGNOSIS — K7200 Acute and subacute hepatic failure without coma: Secondary | ICD-10-CM | POA: Diagnosis not present

## 2019-06-29 DIAGNOSIS — Z79899 Other long term (current) drug therapy: Secondary | ICD-10-CM

## 2019-06-29 DIAGNOSIS — R4182 Altered mental status, unspecified: Secondary | ICD-10-CM | POA: Diagnosis not present

## 2019-06-29 DIAGNOSIS — D631 Anemia in chronic kidney disease: Secondary | ICD-10-CM | POA: Diagnosis not present

## 2019-06-29 DIAGNOSIS — Z8679 Personal history of other diseases of the circulatory system: Secondary | ICD-10-CM

## 2019-06-29 DIAGNOSIS — Z9049 Acquired absence of other specified parts of digestive tract: Secondary | ICD-10-CM

## 2019-06-29 DIAGNOSIS — Z95828 Presence of other vascular implants and grafts: Secondary | ICD-10-CM | POA: Diagnosis not present

## 2019-06-29 DIAGNOSIS — D649 Anemia, unspecified: Secondary | ICD-10-CM | POA: Diagnosis not present

## 2019-06-29 DIAGNOSIS — I10 Essential (primary) hypertension: Secondary | ICD-10-CM | POA: Diagnosis not present

## 2019-06-29 DIAGNOSIS — R040 Epistaxis: Secondary | ICD-10-CM | POA: Diagnosis not present

## 2019-06-29 DIAGNOSIS — E1129 Type 2 diabetes mellitus with other diabetic kidney complication: Secondary | ICD-10-CM | POA: Diagnosis not present

## 2019-06-29 DIAGNOSIS — Z86711 Personal history of pulmonary embolism: Secondary | ICD-10-CM

## 2019-06-29 LAB — CBC
HCT: 17.2 % — ABNORMAL LOW (ref 36.0–46.0)
HCT: 23.4 % — ABNORMAL LOW (ref 36.0–46.0)
Hemoglobin: 5.8 g/dL — CL (ref 12.0–15.0)
Hemoglobin: 7.8 g/dL — ABNORMAL LOW (ref 12.0–15.0)
MCH: 30.9 pg (ref 26.0–34.0)
MCH: 30.6 pg (ref 26.0–34.0)
MCHC: 33.7 g/dL (ref 30.0–36.0)
MCHC: 33.3 g/dL (ref 30.0–36.0)
MCV: 91.5 fL (ref 80.0–100.0)
MCV: 91.8 fL (ref 80.0–100.0)
Platelets: 133 10*3/uL — ABNORMAL LOW (ref 150–400)
Platelets: 132 10*3/uL — ABNORMAL LOW (ref 150–400)
RBC: 1.88 MIL/uL — ABNORMAL LOW (ref 3.87–5.11)
RBC: 2.55 MIL/uL — ABNORMAL LOW (ref 3.87–5.11)
RDW: 15.8 % — ABNORMAL HIGH (ref 11.5–15.5)
RDW: 14.8 % (ref 11.5–15.5)
WBC: 15.5 10*3/uL — ABNORMAL HIGH (ref 4.0–10.5)
WBC: 14.1 10*3/uL — ABNORMAL HIGH (ref 4.0–10.5)
nRBC: 0.1 % (ref 0.0–0.2)
nRBC: 0.2 % (ref 0.0–0.2)

## 2019-06-29 LAB — CBC WITH DIFFERENTIAL/PLATELET
Abs Immature Granulocytes: 0.19 10*3/uL — ABNORMAL HIGH (ref 0.00–0.07)
Basophils Absolute: 0 10*3/uL (ref 0.0–0.1)
Basophils Relative: 0 %
Eosinophils Absolute: 0.1 10*3/uL (ref 0.0–0.5)
Eosinophils Relative: 1 %
HCT: 20.8 % — ABNORMAL LOW (ref 36.0–46.0)
Hemoglobin: 6.7 g/dL — CL (ref 12.0–15.0)
Immature Granulocytes: 1 %
Lymphocytes Relative: 6 %
Lymphs Abs: 0.9 10*3/uL (ref 0.7–4.0)
MCH: 30 pg (ref 26.0–34.0)
MCHC: 32.2 g/dL (ref 30.0–36.0)
MCV: 93.3 fL (ref 80.0–100.0)
Monocytes Absolute: 0.2 10*3/uL (ref 0.1–1.0)
Monocytes Relative: 1 %
Neutro Abs: 13.8 10*3/uL — ABNORMAL HIGH (ref 1.7–7.7)
Neutrophils Relative %: 91 %
Platelets: 170 10*3/uL (ref 150–400)
RBC: 2.23 MIL/uL — ABNORMAL LOW (ref 3.87–5.11)
RDW: 16.2 % — ABNORMAL HIGH (ref 11.5–15.5)
WBC: 15.3 10*3/uL — ABNORMAL HIGH (ref 4.0–10.5)
nRBC: 0 % (ref 0.0–0.2)

## 2019-06-29 LAB — POCT I-STAT 7, (LYTES, BLD GAS, ICA,H+H)
Acid-base deficit: 9 mmol/L — ABNORMAL HIGH (ref 0.0–2.0)
Bicarbonate: 18.7 mmol/L — ABNORMAL LOW (ref 20.0–28.0)
Calcium, Ion: 1.08 mmol/L — ABNORMAL LOW (ref 1.15–1.40)
HCT: 21 % — ABNORMAL LOW (ref 36.0–46.0)
Hemoglobin: 7.1 g/dL — ABNORMAL LOW (ref 12.0–15.0)
O2 Saturation: 90 %
Patient temperature: 90.4
Potassium: 3.7 mmol/L (ref 3.5–5.1)
Sodium: 137 mmol/L (ref 135–145)
TCO2: 20 mmol/L — ABNORMAL LOW (ref 22–32)
pCO2 arterial: 40.7 mmHg (ref 32.0–48.0)
pH, Arterial: 7.245 — ABNORMAL LOW (ref 7.350–7.450)
pO2, Arterial: 53 mmHg — ABNORMAL LOW (ref 83.0–108.0)

## 2019-06-29 LAB — DIC (DISSEMINATED INTRAVASCULAR COAGULATION)PANEL
D-Dimer, Quant: 1.65 ug/mL-FEU — ABNORMAL HIGH (ref 0.00–0.50)
Fibrinogen: 424 mg/dL (ref 210–475)
INR: >10 (ref 0.8–1.2)
Platelets: 219 10*3/uL (ref 150–400)
Platelets: 196 10*3/uL (ref 150–400)
Prothrombin Time: 89.3 seconds — ABNORMAL HIGH (ref 11.4–15.2)
Smear Review: NONE SEEN
Smear Review: NONE SEEN
aPTT: 61 seconds — ABNORMAL HIGH (ref 24–36)

## 2019-06-29 LAB — HEMOGLOBIN A1C
Hgb A1c MFr Bld: 6.4 % — ABNORMAL HIGH (ref 4.8–5.6)
Mean Plasma Glucose: 136.98 mg/dL

## 2019-06-29 LAB — COMPREHENSIVE METABOLIC PANEL
ALT: 151 U/L — ABNORMAL HIGH (ref 0–44)
ALT: 119 U/L — ABNORMAL HIGH (ref 0–44)
AST: 661 U/L — ABNORMAL HIGH (ref 15–41)
AST: 524 U/L — ABNORMAL HIGH (ref 15–41)
Albumin: 1.7 g/dL — ABNORMAL LOW (ref 3.5–5.0)
Albumin: 1.6 g/dL — ABNORMAL LOW (ref 3.5–5.0)
Alkaline Phosphatase: 201 U/L — ABNORMAL HIGH (ref 38–126)
Alkaline Phosphatase: 157 U/L — ABNORMAL HIGH (ref 38–126)
Anion gap: 7 (ref 5–15)
Anion gap: 11 (ref 5–15)
BUN: 36 mg/dL — ABNORMAL HIGH (ref 8–23)
BUN: 33 mg/dL — ABNORMAL HIGH (ref 8–23)
CO2: 20 mmol/L — ABNORMAL LOW (ref 22–32)
CO2: 19 mmol/L — ABNORMAL LOW (ref 22–32)
Calcium: 6.9 mg/dL — ABNORMAL LOW (ref 8.9–10.3)
Calcium: 7 mg/dL — ABNORMAL LOW (ref 8.9–10.3)
Chloride: 108 mmol/L (ref 98–111)
Chloride: 106 mmol/L (ref 98–111)
Creatinine, Ser: 1.63 mg/dL — ABNORMAL HIGH (ref 0.44–1.00)
Creatinine, Ser: 1.6 mg/dL — ABNORMAL HIGH (ref 0.44–1.00)
GFR calc Af Amer: 34 mL/min — ABNORMAL LOW (ref >60)
GFR calc Af Amer: 35 mL/min — ABNORMAL LOW (ref >60)
GFR calc non Af Amer: 30 mL/min — ABNORMAL LOW (ref >60)
GFR calc non Af Amer: 30 mL/min — ABNORMAL LOW (ref >60)
Glucose, Bld: 88 mg/dL (ref 70–99)
Glucose, Bld: 124 mg/dL — ABNORMAL HIGH (ref 70–99)
Potassium: 4.8 mmol/L (ref 3.5–5.1)
Potassium: 3.7 mmol/L (ref 3.5–5.1)
Sodium: 135 mmol/L (ref 135–145)
Sodium: 136 mmol/L (ref 135–145)
Total Bilirubin: 2.3 mg/dL — ABNORMAL HIGH (ref 0.3–1.2)
Total Bilirubin: 2.8 mg/dL — ABNORMAL HIGH (ref 0.3–1.2)
Total Protein: 3.7 g/dL — ABNORMAL LOW (ref 6.5–8.1)
Total Protein: 3.6 g/dL — ABNORMAL LOW (ref 6.5–8.1)

## 2019-06-29 LAB — PROTIME-INR
INR: 2.1 — ABNORMAL HIGH (ref 0.8–1.2)
INR: 2.2 — ABNORMAL HIGH (ref 0.8–1.2)
Prothrombin Time: 23.4 seconds — ABNORMAL HIGH (ref 11.4–15.2)
Prothrombin Time: 23.7 seconds — ABNORMAL HIGH (ref 11.4–15.2)

## 2019-06-29 LAB — LACTIC ACID, PLASMA: Lactic Acid, Venous: 2.6 mmol/L (ref 0.5–1.9)

## 2019-06-29 LAB — GLUCOSE, CAPILLARY
Glucose-Capillary: 84 mg/dL (ref 70–99)
Glucose-Capillary: 88 mg/dL (ref 70–99)
Glucose-Capillary: 95 mg/dL (ref 70–99)
Glucose-Capillary: 109 mg/dL — ABNORMAL HIGH (ref 70–99)

## 2019-06-29 LAB — FIBRINOGEN
Fibrinogen: 378 mg/dL (ref 210–475)
Fibrinogen: 352 mg/dL (ref 210–475)

## 2019-06-29 LAB — APTT
aPTT: 50 seconds — ABNORMAL HIGH (ref 24–36)
aPTT: 44 seconds — ABNORMAL HIGH (ref 24–36)

## 2019-06-29 LAB — PREPARE RBC (CROSSMATCH)

## 2019-06-29 LAB — TRIGLYCERIDES: Triglycerides: 56 mg/dL (ref <150)

## 2019-06-29 LAB — ACETAMINOPHEN LEVEL: Acetaminophen (Tylenol), Serum: 31 ug/mL — ABNORMAL HIGH (ref 10–30)

## 2019-06-29 LAB — TSH: TSH: 1.031 u[IU]/mL (ref 0.350–4.500)

## 2019-06-29 MED ORDER — LEVOTHYROXINE SODIUM 100 MCG/5ML IV SOLN
67.5000 ug | Freq: Every day | INTRAVENOUS | Status: DC
  Filled 2019-06-29: qty 5

## 2019-06-29 MED ORDER — NOREPINEPHRINE 4 MG/250ML-% IV SOLN
INTRAVENOUS | Status: AC
  Administered 2019-06-29: 13:00:00 10 ug/min via INTRAVENOUS
  Filled 2019-06-29: qty 250

## 2019-06-29 MED ORDER — ORAL CARE MOUTH RINSE
15.0000 mL | OROMUCOSAL | Status: DC
  Administered 2019-06-29 – 2019-07-03 (×39): 15 mL via OROMUCOSAL

## 2019-06-29 MED ORDER — LEVOTHYROXINE SODIUM 100 MCG/5ML IV SOLN
62.5000 ug | Freq: Every day | INTRAVENOUS | Status: DC
  Administered 2019-06-30 – 2019-07-01 (×2): 62.5 ug via INTRAVENOUS
  Filled 2019-06-29 (×2): qty 5

## 2019-06-29 MED ORDER — SODIUM CHLORIDE 0.9% IV SOLUTION
Freq: Once | INTRAVENOUS | Status: AC
  Administered 2019-06-29: 18:00:00 via INTRAVENOUS

## 2019-06-29 MED ORDER — INSULIN ASPART 100 UNIT/ML ~~LOC~~ SOLN
0.0000 [IU] | SUBCUTANEOUS | Status: DC
  Administered 2019-07-01 (×2): 2 [IU] via SUBCUTANEOUS
  Administered 2019-07-02: 01:00:00 3 [IU] via SUBCUTANEOUS
  Administered 2019-07-02: 16:00:00 1 [IU] via SUBCUTANEOUS
  Administered 2019-07-02: 08:00:00 3 [IU] via SUBCUTANEOUS
  Administered 2019-07-02: 2 [IU] via SUBCUTANEOUS
  Administered 2019-07-02: 05:00:00 3 [IU] via SUBCUTANEOUS

## 2019-06-29 MED ORDER — LEVOFLOXACIN IN D5W 500 MG/100ML IV SOLN
500.0000 mg | INTRAVENOUS | Status: DC
  Administered 2019-06-30: 500 mg via INTRAVENOUS
  Filled 2019-06-29 (×3): qty 100

## 2019-06-29 MED ORDER — VANCOMYCIN HCL IN DEXTROSE 1-5 GM/200ML-% IV SOLN: 1000.0000 mg | INTRAVENOUS | Status: DC

## 2019-06-29 MED ORDER — CALCIUM GLUCONATE-NACL 1-0.675 GM/50ML-% IV SOLN
1.0000 g | Freq: Once | INTRAVENOUS | Status: AC
  Administered 2019-06-29: 1000 mg via INTRAVENOUS
  Filled 2019-06-29: qty 50

## 2019-06-29 MED ORDER — SODIUM CHLORIDE 0.9% IV SOLUTION
Freq: Once | INTRAVENOUS | Status: AC
  Administered 2019-06-29: 14:00:00 via INTRAVENOUS

## 2019-06-29 MED ORDER — ACETAMINOPHEN 325 MG PO TABS: 650.0000 mg | ORAL_TABLET | ORAL | Status: DC | PRN

## 2019-06-29 MED ORDER — NOREPINEPHRINE 4 MG/250ML-% IV SOLN
0.0000 ug/min | INTRAVENOUS | Status: DC
  Administered 2019-06-29: 13:00:00 10 ug/min via INTRAVENOUS
  Administered 2019-06-30: 5 ug/min via INTRAVENOUS
  Administered 2019-06-30: 12 ug/min via INTRAVENOUS
  Administered 2019-06-30: 03:00:00 6 ug/min via INTRAVENOUS
  Administered 2019-07-02: 01:00:00 3 ug/min via INTRAVENOUS
  Administered 2019-07-03: 6 ug/min via INTRAVENOUS
  Administered 2019-07-03: 2 ug/min via INTRAVENOUS
  Filled 2019-06-29 (×6): qty 250

## 2019-06-29 MED ORDER — PROPOFOL 1000 MG/100ML IV EMUL
5.0000 ug/kg/min | INTRAVENOUS | Status: DC
  Administered 2019-06-29: 21:00:00 40 ug/kg/min via INTRAVENOUS
  Administered 2019-06-29: 10 ug/kg/min via INTRAVENOUS
  Administered 2019-06-30: 06:00:00 35 ug/kg/min via INTRAVENOUS
  Filled 2019-06-29 (×2): qty 100

## 2019-06-29 MED ORDER — PROTHROMBIN COMPLEX CONC HUMAN 500 UNITS IV KIT
3302.0000 [IU] | PACK | Status: AC
  Administered 2019-06-29: 18:00:00 3302 [IU] via INTRAVENOUS
  Filled 2019-06-29: qty 3302

## 2019-06-29 MED ORDER — PANTOPRAZOLE SODIUM 40 MG IV SOLR
40.0000 mg | Freq: Two times a day (BID) | INTRAVENOUS | Status: DC
  Administered 2019-06-29 – 2019-07-03 (×9): 40 mg via INTRAVENOUS
  Filled 2019-06-29 (×9): qty 40

## 2019-06-29 MED ORDER — DEXTROSE 5 % IV SOLN
15.0000 mg/kg/h | INTRAVENOUS | Status: DC
  Administered 2019-06-29: 22:00:00 15 mg/kg/h via INTRAVENOUS
  Filled 2019-06-29 (×2): qty 120

## 2019-06-29 MED ORDER — FENTANYL CITRATE (PF) 100 MCG/2ML IJ SOLN
INTRAMUSCULAR | Status: AC
  Administered 2019-06-29: 50 ug via INTRAVENOUS
  Filled 2019-06-29: qty 2

## 2019-06-29 MED ORDER — CHLORHEXIDINE GLUCONATE CLOTH 2 % EX PADS
6.0000 | MEDICATED_PAD | Freq: Every day | CUTANEOUS | Status: DC
  Administered 2019-06-29 – 2019-07-03 (×4): 6 via TOPICAL

## 2019-06-29 MED ORDER — VASOPRESSIN 20 UNIT/ML IV SOLN
0.0300 [IU]/min | INTRAVENOUS | Status: DC
  Administered 2019-06-29 – 2019-06-30 (×2): 0.03 [IU]/min via INTRAVENOUS
  Filled 2019-06-29 (×3): qty 2

## 2019-06-29 MED ORDER — VITAMIN K1 10 MG/ML IJ SOLN
10.0000 mg | Freq: Once | INTRAVENOUS | Status: AC
  Administered 2019-06-29: 10 mg via INTRAVENOUS
  Filled 2019-06-29: qty 1

## 2019-06-29 MED ORDER — FENTANYL CITRATE (PF) 100 MCG/2ML IJ SOLN
50.0000 ug | Freq: Once | INTRAMUSCULAR | Status: AC
  Administered 2019-06-29: 17:00:00 50 ug via INTRAVENOUS

## 2019-06-29 MED ORDER — CHLORHEXIDINE GLUCONATE 0.12% ORAL RINSE (MEDLINE KIT)
15.0000 mL | Freq: Two times a day (BID) | OROMUCOSAL | Status: DC
  Administered 2019-06-29 – 2019-07-03 (×8): 15 mL via OROMUCOSAL

## 2019-06-29 MED ORDER — "THROMBI-PAD 3""X3"" EX PADS"
1.0000 | MEDICATED_PAD | Freq: Once | CUTANEOUS | Status: AC
  Administered 2019-06-29: 1 via TOPICAL
  Filled 2019-06-29: qty 1

## 2019-06-29 MED ORDER — VANCOMYCIN HCL 10 G IV SOLR
1250.0000 mg | Freq: Once | INTRAVENOUS | Status: AC
  Administered 2019-06-29: 1250 mg via INTRAVENOUS
  Filled 2019-06-29: qty 1250

## 2019-06-29 MED ORDER — ACETYLCYSTEINE LOAD VIA INFUSION
150.0000 mg/kg | Freq: Once | INTRAVENOUS | Status: AC
  Administered 2019-06-29: 9030 mg via INTRAVENOUS
  Filled 2019-06-29: qty 226

## 2019-06-29 NOTE — H&P (Addendum)
NAME:  [name], MRN:  [id number], DOB:  [birth year], LOS: 0 ADMISSION DATE:  07/12/2019, CONSULTATION DATE:  06/23/2019 REFERRING MD:  Oval Linsey, CHIEF COMPLAINT:  Bleeding   Brief History   [name] is a 78 yo F w/ PMH of PE on Eliquis, PAD, HFpEF, HTN, DM and recent femur fracture s/p ORIF admit for anemia at [facility] with significant diffuse active bleed transfer to [facility] for intubation and pressor support.  History of present illness   [name] is a 78 yo F w/ PMH of recent diagnosis of PE on V/Q scan on Eliquis, PAD on antiplatelet therapy, HFpEF, HTN, DM and recent femur fracture s/p ORIF who presented to [facility] after fall on 06/19/19. She was found to have left femur head fracture and underwent hemoarthroplasty without complication on [id number]. On hospital day 4, she was noted to be altered with weakness and was started on Levaquin for UTI. On hospital day 5, she developed acute hypoxic respiratory failure and CTA was unable to be performed due to lack of vascular access and AKI. V/Q scan was performed showing perfusion defect on LUL and she was started on anticoagulation. LE [place] did not show a DVT. On 06/27/19, she began to exhibit signs of diffuse bleeding with epistaxis, oozing from her surgical site and her anticoagulation was held. On 07/17/2019 she began to have worsening bleed from nail beds, IV sites and hemoptysis. She was started on FFP for DIC but developed hypotension requiring pressors and she was transferred to [facility] for further management.  Past Medical History  Pulmonary Embolism Heart Failure w/ Preserved Ejection Fraction HTN T2DM L Femur Fracture s/p ORIF Hypothyroidism Anxiety/Depression  Significant Hospital Events   8/28 Admit to [facility] 8/29 L Hemiarthroplasty 9/2 UTI, started on abx 9/3 V/Q scan diagnosed LUL defect started on eliquis for PE 9/5 Bleeding events started 9/7 Worsening bleed w/ hypotension requiring pressors 9/7 Intubated at [facility]  9/7 Transfer to [facility]  Consults:    Procedures:   Significant Diagnostic Tests:    Micro Data:   Antimicrobials:  9/2 Levaquin > 9/7 9/3 Unasyn > 9/7 9/2 Diflucan > 9/4  Interim history/subjective:  [name] was examined and evaluated at bedside on arrival from [facility]. She currently unresponsive to verbal or painful stimuli. Sedation came off recently. Occasional movement but not interactive.  Objective   Blood pressure (!) 104/44, pulse 62, temperature (!) 90.4 F (32.4 C), temperature source Rectal, resp. rate 12, height 5\' 2"  (1.575 m), weight 60.2 kg, SpO2 (!) 88 %.    Vent Mode: PRVC FiO2 (%):  [60 %-100 %] 80 % Set Rate:  [14 bmp] 14 bmp Vt Set:  [300 mL-400 mL] 300 mL PEEP:  [5 cmH20-8 cmH20] 8 cmH20 Plateau Pressure:  [20 cmH20] 20 cmH20  No intake or output data in the 24 hours ending 07/22/2019 1419 Filed Weights   07/01/2019 1223  Weight: 60.2 kg   Examination: Gen: Chronically ill-appearing HEENT: Epistaxis, gum bleed, dried blood noted around oropharynx CV: Bradycardic, regular rhythm, S1, S2 normal Pulm: Bilateral diffuse crackles Abd: Soft, BS+, NTND Extm: ROM intact, Peripheral pulses intact, 2+ peripheral edema. Left hip operative site covered in bandaging with oozing bleed Skin: Pallor, Multiple areas of superficial oozing bleed most prominent in upper extremities Neuro: [facility] Problem list     Assessment & Plan:   Diffuse bleeding 2/2 likely DIC On exam observed supercifical areas of multiple bleed. On chart review, INR elevated to 7 this am. Received  1 unit FFP and 101 Cryoprecipitate at [facility]. No obvious infectious etiology or new medications. Was on levoquin + aztreonam for presumed UTI + pencillin allergy - Start cbc, PT/INR, D-dimer, fibrinogen - Type and Screen - Transfuse 2 units pRBC, FFP - C/w broad spectrum abxs: vancomycin, levoquin   Hypovolemic Shock 2/2 active bleed Hypotensive at [facility]  w/o improvement w/ 500cc LR. Current MAP~60 on levophed - C/w levophed to keep MAP >65 - Add vasopressin - Monitor vitals - I/Os  Hypoxic respiratory failure requiring intubation Pulmonary Embolism Current PRVC FiO2 60, RR 16, PEEP 5 T.Volume 300. O2 sat 90. Per chart review, pulmonary edema component noted during prior days w/ IV diuresis. PE confirmed on V/Q scan on 9/3 was on Eliquis prior to bleeding events.  - Stat ABGs - C/w intubation with PRVC keep O2 sat >90  - Start Chest X-ray - Hold diuresis in setting of hypotension - Hold anticoagulation in setting of   T2DM - SSI, glucose checks  Hypothyroidism - C/w home meds: levothyrxoine 115mcg daily  Depression/Anxiety Paroxetine 40mg  at home - Holding home psych meds for NPO  HTN - Holding home antihypertensives in setting of NPO  Best practice:  Diet: NPO Pain/Anxiety/Delirium protocol (if indicated): Propofol VAP protocol (if indicated): N/A DVT prophylaxis: Currently in active bleed GI prophylaxis: Pantoprazole Glucose control: SSI Mobility: BR Code Status: Full Family Communication: Discussed with son at bedside regarding current critical status Disposition: ICU  Labs   CBC: Recent Labs  Lab 07/06/2019 1310 07/14/2019 1356 06/26/2019 1411  WBC 15.3*  --   --   NEUTROABS 13.8*  --   --   HGB 6.7*  --  7.1*  HCT 20.8*  --  21.0*  MCV 93.3  --   --   PLT 170 219  --     Basic Metabolic Panel: Recent Labs  Lab 07/17/2019 1411  NA 137  K 3.7   GFR: CrCl cannot be calculated (Patient's most recent lab result is older than the maximum 21 days allowed.). Recent Labs  Lab 07/16/2019 1310  WBC 15.3*    Liver Function Tests: No results for input(s): AST, ALT, ALKPHOS, BILITOT, PROT, ALBUMIN in the last 168 hours. No results for input(s): LIPASE, AMYLASE in the last 168 hours. No results for input(s): AMMONIA in the last 168 hours.  ABG    Component Value Date/Time   PHART 7.245 (L) 07/02/2019 1411    PCO2ART 40.7 06/27/2019 1411   PO2ART 53.0 (L) 06/26/2019 1411   HCO3 18.7 (L) 06/30/2019 1411   TCO2 20 (L) 07/12/2019 1411   ACIDBASEDEF 9.0 (H) 07/09/2019 1411   O2SAT 90.0 07/06/2019 1411     Coagulation Profile: Recent Labs  Lab 07/07/2019 1356  INR SPECIMEN HEMOLYZED. HEMOLYSIS MAY AFFECT INTEGRITY OF RESULTS.    Cardiac Enzymes: No results for input(s): CKTOTAL, CKMB, CKMBINDEX, TROPONINI in the last 168 hours.  HbA1C: Hgb A1c MFr Bld  Date/Time Value Ref Range Status  07/09/2018 03:04 PM 9.9 (H) 4.8 - 5.6 % Final    Comment:    (NOTE)         Prediabetes: 5.7 - 6.4         Diabetes: >6.4         Glycemic control for adults with diabetes: <7.0   07/04/2018 08:24 AM 10.1 (H) 4.8 - 5.6 % Final    Comment:    (NOTE) Pre diabetes:          5.7%-6.4% Diabetes:              >  6.4% Glycemic control for   <7.0% adults with diabetes     CBG: Recent Labs  Lab 07/08/2019 1255  GLUCAP 84    Review of Systems:     Past Medical History  She,  has a past medical history of AAA (abdominal aortic aneurysm) ([place]) (02/26/2012), Arthritis, 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, unspecified extremity ([place]) (05/28/2017), Blood transfusion (2011), Cancer [facility]), Claudication of both lower extremities ([place]) (05/28/2017), COPD (chronic obstructive pulmonary disease) ([place]), Depression, Diabetes mellitus, Diabetes mellitus with peripheral vascular disease ([place]) (05/28/2017), GERD (gastroesophageal reflux disease), Hip pain, Hypertension, Hypothyroidism, Iliac artery occlusion ([place]) (02/26/2012), Myocardial infarction [facility]), Peripheral vascular disease ([place]), PVD (peripheral vascular disease) with claudication ([place]) (02/26/2012), Sleep apnea, and Type II or unspecified type diabetes mellitus with peripheral circulatory disorders, uncontrolled(250.72) (06/15/2014).   Surgical History    Past Surgical History:  Procedure Laterality Date  . ABDOMINAL ANGIOGRAM N/A 01/31/2012    Procedure: ABDOMINAL ANGIOGRAM;  Surgeon: Conrad Talco, MD;  Location: [facility] CATH LAB;  Service: Cardiovascular;  Laterality: N/A;  . ABDOMINAL AORTOGRAM N/A 07/16/2017   Procedure: ABDOMINAL AORTOGRAM;  Surgeon: Serafina Mitchell, MD;  Location: [place] CV LAB;  Service: Cardiovascular;  Laterality: N/A;  . ABDOMINAL AORTOGRAM W/LOWER EXTREMITY N/A 06/24/2018   Procedure: ABDOMINAL AORTOGRAM W/LOWER EXTREMITY;  Surgeon: Serafina Mitchell, MD;  Location: [place] CV LAB;  Service: Cardiovascular;  Laterality: N/A;  . APPENDECTOMY    . BREAST SURGERY  2009   lumpectomy Rt breast  . CHOLECYSTECTOMY    . FEET SURGERY    . FEMORAL-POPLITEAL BYPASS GRAFT Right 07/09/2018   Procedure: RIGHT FEMORAL ARTERY TO BELOW THE KNEE POPLITEAL ARTERY BYPASS GRAFT USING SAPHENOUS VEIN;  Surgeon: Serafina Mitchell, MD;  Location: [place];  Service: Vascular;  Laterality: Right;  . GROIN DISSECTION Right 07/09/2018   Procedure: REDO RIGHT COMMON FEMORAL EXPLORATION;  Surgeon: Serafina Mitchell, MD;  Location: [facility] OR;  Service: Vascular;  Laterality: Right;  . LOWER EXTREMITY ANGIOGRAPHY Bilateral 07/16/2017   Procedure: Lower Extremity Angiography;  Surgeon: Serafina Mitchell, MD;  Location: [place] CV LAB;  Service: Cardiovascular;  Laterality: Bilateral;  . PR VEIN BYPASS GRAFT,AORTO-FEM-POP    . TUBAL LIGATION    . VEIN HARVEST Right 07/09/2018   Procedure: RIGHT LEG SAPHENOUS VEIN HARVEST;  Surgeon: Serafina Mitchell, MD;  Location[facility] OR;  Service: Vascular;  Laterality: Right;     Social History   reports that she has been smoking cigarettes. She has a 32.50 pack-year smoking history. She has never used smokeless tobacco. She reports previous alcohol use. She reports that she does not use drugs.   Family History   Her family history includes Cancer in her father and sister; Heart disease in her mother. There is no history of Anesthesia problems.   Allergies Allergies  Allergen Reactions  . Penicillins  Swelling and Rash    Has patient had a PCN reaction causing immediate rash, facial/tongue/throat swelling, SOB or lightheadedness with hypotension: No Has patient had a PCN reaction causing severe rash involving mucus membranes or skin necrosis: No Has patient had a PCN reaction that required hospitalization: No Has patient had a PCN reaction occurring within the last 10 years:No If all of the above answers are "NO", then may proceed with Cephalosporin use.      Home Medications  Prior to Admission medications   Medication Sig Start Date End Date Taking? Authorizing Provider  ACCU-CHEK AVIVA PLUS test strip  02/17/19   [provider]  Accu-Chek FastClix Lancets MISC  02/17/19   [provider]  alendronate (FOSAMAX) 70 MG tablet Take 70 mg by mouth once a week. Take with a full glass of water on an empty stomach.  Takes on Friday    [provider]  amLODipine-benazepril (LOTREL) 5-40 MG capsule Take 1 capsule by mouth at bedtime. 05/15/17   [provider]  anastrozole (ARIMIDEX) 1 MG tablet  02/17/19   [provider]  aspirin 325 MG tablet Take 325 mg by mouth daily.    [provider]  atenolol (TENORMIN) 100 MG tablet Take 100 mg by mouth daily.    [provider]  atorvastatin (LIPITOR) 40 MG tablet Take 40 mg by mouth at bedtime. 05/15/17   [provider]  gabapentin (NEURONTIN) 300 MG capsule Take 300 mg by mouth 2 (two) times daily.    [provider]  hydrALAZINE (APRESOLINE) 25 MG tablet Take 25 mg by mouth 3 (three) times daily.    [provider]  Insulin Glargine (LANTUS SOLOSTAR) 100 UNIT/ML Solostar Pen Inject 20 Units into the skin daily at 10 pm. Patient taking differently: Inject 10 Units into the skin daily at 10 pm.  07/16/18   Rhyne, Hulen Shouts, PA-C  lansoprazole (PREVACID) 30 MG capsule Take 30 mg by mouth 2 (two) times daily. 05/15/17   [provider]  levothyroxine  (SYNTHROID, LEVOTHROID) 137 MCG tablet Take 137 mcg by mouth daily before breakfast.    [provider]  losartan (COZAAR) 100 MG tablet Take 100 mg by mouth daily with breakfast. 06/20/17   [provider]  nicotine (NICODERM CQ - DOSED IN MG/24 HOURS) 14 mg/24hr patch Place 14 mg onto the skin daily.    [provider]  PARoxetine (PAXIL) 40 MG tablet Take 40 mg by mouth every morning.    [provider]  potassium chloride SA (K-DUR,KLOR-CON) 20 MEQ tablet Take 20 mEq by mouth daily.    [provider]  rOPINIRole (REQUIP) 4 MG tablet Take 4 mg by mouth at bedtime.    [provider]  torsemide (DEMADEX) 20 MG tablet Take 40 mg by mouth daily.    [provider]  Nicholes Calamity 31G X 5 MM [place]  02/17/19   [provider]     Critical care time:     Attending attestation to follow  Gilberto Better, MD PGY2, [place] [facility] Pager: [phone]

## 2019-06-29 NOTE — Progress Notes (Signed)
eLink Physician-Brief Progress Note Patient Name: [name] DOB: [birth year] MRN: [id number]   Date of Service  07/09/2019  HPI/Events of Note  Called with Tylenol level drawn at [facility] @ 8:14 AM prior to transfer = 26.7. AST = 661, ALT = 151 and INR > 10.0 prior to blood product administration.   eICU Interventions  Will order: 1. D/C Tylenol PRN. 2. Tylenol level STAT. 3. Mucomyst IV load and infusion. 4. Tylenol level in AM. 5. Will contact poison control.      Intervention Category Major Interventions: Other:  Lysle Dingwall 07/01/2019, 8:13 PM

## 2019-06-29 NOTE — Procedures (Signed)
Central Venous Catheter Insertion Procedure Note [name] [id number] [birth year]  Procedure: Insertion of Central Venous Catheter Indications: Assessment of intravascular volume, Drug and/or fluid administration and Frequent blood sampling  Procedure Details Consent: Risks of procedure as well as the alternatives and risks of each were explained to the (patient/caregiver).  Consent for procedure obtained. Son, Sammie Time Out: Verified patient identification, verified procedure, site/side was marked, verified correct patient position, special equipment/implants available, medications/allergies/relevent history reviewed, required imaging and test results available.  Performed  Maximum sterile technique was used including antiseptics, cap, gloves, gown, hand hygiene, mask and sheet. Skin prep: Chlorhexidine; local anesthetic administered A antimicrobial bonded/coated triple lumen catheter was placed in the left internal jugular vein using the Seldinger technique.  Evaluation Blood flow good Complications: No apparent complications Patient did tolerate procedure well. Chest X-ray ordered to verify placement.  CXR: pending.  [name] 07/07/2019, 4:48 PM

## 2019-06-29 NOTE — Progress Notes (Signed)
Plan of care discussed with Dr. Carlis Abbott, pharmacy.  Decision to proceed with kcentra for anticoagulation (eliquis) reversal in the setting of bleeding with Hgb 7.1, platelets 196, fibrinogen 424, PT 89.3, INR >10.  Vitamin K ordered as well as FFP.  Dr. Earlie Server consulted for hematology evaluation per Dr. Carlis Abbott.    Noe Gens, NP-C [facility] Pgr: [phone] or if no answer [phone] 07/21/2019, 4:32 PM

## 2019-06-29 NOTE — Progress Notes (Signed)
Pharmacy Antibiotic Note  [name] is a 78 y.o. female transferred to [facility] from [place] on 06/23/2019 with bleeding.  Patient was on Eliquis for recent PE.  Pharmacy has been consulted for vancomycin and levofloxacin dosing as empiric therapy for sepsis.  Noted she has an allergy to PCN and no history of cephalosporin use.  SCr 1.63, CrCL 24 ml/min, hypothermia, WBC 15.3  Vanc 9/7 >> LVQ PTA 9/2 >>  9/7 UCx - 9/7 TA - 9/7 BCx -   Oval Linsey data: LVQ 500mg  Q48H, LD 9/6 at 1400 PTA, 9/2 >> 9/7 Azactam 2gm IV Q8H, 9/3 >> 9/6  8/28 MRSA PCR - negative 9/1 C.diff - negative 9/2 UCx - contaminated 9/2 BCx - negative  Plan: Vanc 1250mg  IV x 1, then 1gm IV Q48H for AUC 507 using SCr 1.63 Continue LVQ 500mg  IV Q48H, next dose 9/8 at 1400 Monitor renal fxn, clinical progress, vanc AUC as indicated F/U narrow abx   Height: 5\' 2"  (157.5 cm) Weight: 132 lb 11.5 oz (60.2 kg) IBW/kg (Calculated) : 50.1  Temp (24hrs), Avg:90.4 F (32.4 C), Min:90.4 F (32.4 C), Max:90.4 F (32.4 C)  Recent Labs  Lab 06/28/2019 1310 07/13/2019 1354  WBC 15.3*  --   CREATININE  --  1.63*    Estimated Creatinine Clearance: 23.9 mL/min (A) (by C-G formula based on SCr of 1.63 mg/dL (H)).    Allergies  Allergen Reactions  . Penicillins Swelling and Rash    Has patient had a PCN reaction causing immediate rash, facial/tongue/throat swelling, SOB or lightheadedness with hypotension: No Has patient had a PCN reaction causing severe rash involving mucus membranes or skin necrosis: No Has patient had a PCN reaction that required hospitalization: No Has patient had a PCN reaction occurring within the last 10 years:No If all of the above answers are "NO", then may proceed with Cephalosporin use.     Estefanie Cornforth D. Mina Marble, PharmD, BCPS, [place] 07/11/2019, 2:53 PM

## 2019-06-30 ENCOUNTER — Encounter (HOSPITAL_COMMUNITY): Payer: Self-pay | Admitting: Oncology

## 2019-06-30 DIAGNOSIS — J9600 Acute respiratory failure, unspecified whether with hypoxia or hypercapnia: Secondary | ICD-10-CM

## 2019-06-30 DIAGNOSIS — I10 Essential (primary) hypertension: Secondary | ICD-10-CM

## 2019-06-30 DIAGNOSIS — J9601 Acute respiratory failure with hypoxia: Secondary | ICD-10-CM

## 2019-06-30 DIAGNOSIS — E039 Hypothyroidism, unspecified: Secondary | ICD-10-CM

## 2019-06-30 DIAGNOSIS — E119 Type 2 diabetes mellitus without complications: Secondary | ICD-10-CM

## 2019-06-30 DIAGNOSIS — N179 Acute kidney failure, unspecified: Secondary | ICD-10-CM

## 2019-06-30 DIAGNOSIS — Z853 Personal history of malignant neoplasm of breast: Secondary | ICD-10-CM

## 2019-06-30 DIAGNOSIS — D649 Anemia, unspecified: Secondary | ICD-10-CM

## 2019-06-30 DIAGNOSIS — E43 Unspecified severe protein-calorie malnutrition: Secondary | ICD-10-CM | POA: Insufficient documentation

## 2019-06-30 LAB — CBC
HCT: 24.6 % — ABNORMAL LOW (ref 36.0–46.0)
HCT: 25.3 % — ABNORMAL LOW (ref 36.0–46.0)
HCT: 18 % — ABNORMAL LOW (ref 36.0–46.0)
Hemoglobin: 8.3 g/dL — ABNORMAL LOW (ref 12.0–15.0)
Hemoglobin: 8.4 g/dL — ABNORMAL LOW (ref 12.0–15.0)
Hemoglobin: 6.1 g/dL — CL (ref 12.0–15.0)
MCH: 31 pg (ref 26.0–34.0)
MCH: 30.8 pg (ref 26.0–34.0)
MCH: 31.3 pg (ref 26.0–34.0)
MCHC: 33.7 g/dL (ref 30.0–36.0)
MCHC: 33.2 g/dL (ref 30.0–36.0)
MCHC: 33.9 g/dL (ref 30.0–36.0)
MCV: 91.8 fL (ref 80.0–100.0)
MCV: 92.7 fL (ref 80.0–100.0)
MCV: 92.3 fL (ref 80.0–100.0)
Platelets: 149 10*3/uL — ABNORMAL LOW (ref 150–400)
Platelets: 163 10*3/uL (ref 150–400)
Platelets: 127 10*3/uL — ABNORMAL LOW (ref 150–400)
RBC: 2.68 MIL/uL — ABNORMAL LOW (ref 3.87–5.11)
RBC: 2.73 MIL/uL — ABNORMAL LOW (ref 3.87–5.11)
RBC: 1.95 MIL/uL — ABNORMAL LOW (ref 3.87–5.11)
RDW: 15.5 % (ref 11.5–15.5)
RDW: 15.9 % — ABNORMAL HIGH (ref 11.5–15.5)
RDW: 15.9 % — ABNORMAL HIGH (ref 11.5–15.5)
WBC: 19 10*3/uL — ABNORMAL HIGH (ref 4.0–10.5)
WBC: 20.2 10*3/uL — ABNORMAL HIGH (ref 4.0–10.5)
WBC: 12.4 10*3/uL — ABNORMAL HIGH (ref 4.0–10.5)
nRBC: 0.2 % (ref 0.0–0.2)
nRBC: 0.4 % — ABNORMAL HIGH (ref 0.0–0.2)
nRBC: 0.6 % — ABNORMAL HIGH (ref 0.0–0.2)

## 2019-06-30 LAB — APTT
aPTT: 43 seconds — ABNORMAL HIGH (ref 24–36)
aPTT: 45 seconds — ABNORMAL HIGH (ref 24–36)
aPTT: 52 seconds — ABNORMAL HIGH (ref 24–36)

## 2019-06-30 LAB — PREPARE FRESH FROZEN PLASMA
Unit division: 0
Unit division: 0
Unit division: 0
Unit division: 0
Unit division: 0
Unit division: 0

## 2019-06-30 LAB — BPAM FFP
Blood Product Expiration Date: 202009082359
Blood Product Expiration Date: 202009082359
Blood Product Expiration Date: 202009092359
Blood Product Expiration Date: 202009092359
Blood Product Expiration Date: 202009122359
Blood Product Expiration Date: 202009122359
Blood Product Expiration Date: 202009122359
Blood Product Expiration Date: 202009122359
ISSUE DATE / TIME: 202009050029
ISSUE DATE / TIME: 202009071458
ISSUE DATE / TIME: 202009071544
ISSUE DATE / TIME: 202009071623
ISSUE DATE / TIME: 202009071715
ISSUE DATE / TIME: 202009080821
ISSUE DATE / TIME: 202009080821
Unit Type and Rh: 1700
Unit Type and Rh: 5100
Unit Type and Rh: 5100
Unit Type and Rh: 5100
Unit Type and Rh: 6200
Unit Type and Rh: 6200
Unit Type and Rh: 7300
Unit Type and Rh: 9500

## 2019-06-30 LAB — URINE CULTURE: Culture: NO GROWTH

## 2019-06-30 LAB — COMPREHENSIVE METABOLIC PANEL
ALT: 133 U/L — ABNORMAL HIGH (ref 0–44)
AST: 600 U/L — ABNORMAL HIGH (ref 15–41)
Albumin: 1.6 g/dL — ABNORMAL LOW (ref 3.5–5.0)
Alkaline Phosphatase: 183 U/L — ABNORMAL HIGH (ref 38–126)
Anion gap: 11 (ref 5–15)
BUN: 34 mg/dL — ABNORMAL HIGH (ref 8–23)
CO2: 19 mmol/L — ABNORMAL LOW (ref 22–32)
Calcium: 7 mg/dL — ABNORMAL LOW (ref 8.9–10.3)
Chloride: 107 mmol/L (ref 98–111)
Creatinine, Ser: 1.69 mg/dL — ABNORMAL HIGH (ref 0.44–1.00)
GFR calc Af Amer: 33 mL/min — ABNORMAL LOW (ref >60)
GFR calc non Af Amer: 28 mL/min — ABNORMAL LOW (ref >60)
Glucose, Bld: 97 mg/dL (ref 70–99)
Potassium: 3.5 mmol/L (ref 3.5–5.1)
Sodium: 137 mmol/L (ref 135–145)
Total Bilirubin: 3.1 mg/dL — ABNORMAL HIGH (ref 0.3–1.2)
Total Protein: 3.7 g/dL — ABNORMAL LOW (ref 6.5–8.1)

## 2019-06-30 LAB — MAGNESIUM: Magnesium: 1.7 mg/dL (ref 1.7–2.4)

## 2019-06-30 LAB — MRSA PCR SCREENING: MRSA by PCR: NEGATIVE

## 2019-06-30 LAB — GLUCOSE, CAPILLARY
Glucose-Capillary: 95 mg/dL (ref 70–99)
Glucose-Capillary: 85 mg/dL (ref 70–99)
Glucose-Capillary: 72 mg/dL (ref 70–99)
Glucose-Capillary: 133 mg/dL — ABNORMAL HIGH (ref 70–99)
Glucose-Capillary: 59 mg/dL — ABNORMAL LOW (ref 70–99)
Glucose-Capillary: 83 mg/dL (ref 70–99)

## 2019-06-30 LAB — PROTIME-INR
INR: 2.4 — ABNORMAL HIGH (ref 0.8–1.2)
INR: 2.3 — ABNORMAL HIGH (ref 0.8–1.2)
INR: 2.4 — ABNORMAL HIGH (ref 0.8–1.2)
Prothrombin Time: 25.7 seconds — ABNORMAL HIGH (ref 11.4–15.2)
Prothrombin Time: 24.8 seconds — ABNORMAL HIGH (ref 11.4–15.2)
Prothrombin Time: 25.9 seconds — ABNORMAL HIGH (ref 11.4–15.2)

## 2019-06-30 LAB — PREPARE RBC (CROSSMATCH)

## 2019-06-30 LAB — PHOSPHORUS: Phosphorus: 5.9 mg/dL — ABNORMAL HIGH (ref 2.5–4.6)

## 2019-06-30 LAB — ACETAMINOPHEN LEVEL: Acetaminophen (Tylenol), Serum: 22 ug/mL (ref 10–30)

## 2019-06-30 LAB — FIBRINOGEN: Fibrinogen: 414 mg/dL (ref 210–475)

## 2019-06-30 MED ORDER — VITAL AF 1.2 CAL PO LIQD
1000.0000 mL | ORAL | Status: DC
  Administered 2019-06-30 – 2019-07-01 (×2): 1000 mL

## 2019-06-30 MED ORDER — FENTANYL CITRATE (PF) 100 MCG/2ML IJ SOLN: 25.0000 ug | Freq: Once | INTRAMUSCULAR | Status: DC

## 2019-06-30 MED ORDER — VITAMIN K1 10 MG/ML IJ SOLN
10.0000 mg | Freq: Every day | INTRAVENOUS | Status: AC
  Administered 2019-06-30 – 2019-07-02 (×3): 10 mg via INTRAVENOUS
  Filled 2019-06-30 (×3): qty 1

## 2019-06-30 MED ORDER — FUROSEMIDE 10 MG/ML IJ SOLN
20.0000 mg | Freq: Once | INTRAMUSCULAR | Status: AC
  Administered 2019-06-30: 20 mg via INTRAVENOUS
  Filled 2019-06-30: qty 2

## 2019-06-30 MED ORDER — FENTANYL BOLUS VIA INFUSION
25.0000 ug | INTRAVENOUS | Status: DC | PRN
  Administered 2019-07-02: 25 ug via INTRAVENOUS
  Filled 2019-06-30: qty 25

## 2019-06-30 MED ORDER — KCL-LACTATED RINGERS 20 MEQ/L IV SOLN
INTRAVENOUS | Status: AC
  Administered 2019-06-30: 09:00:00 via INTRAVENOUS
  Filled 2019-06-30: qty 1000

## 2019-06-30 MED ORDER — MIDAZOLAM HCL 2 MG/2ML IJ SOLN
1.0000 mg | INTRAMUSCULAR | Status: DC | PRN
  Administered 2019-06-30: 1 mg via INTRAVENOUS
  Filled 2019-06-30: qty 2

## 2019-06-30 MED ORDER — VITAL HIGH PROTEIN PO LIQD: 1000.0000 mL | ORAL | Status: DC

## 2019-06-30 MED ORDER — SODIUM CHLORIDE 0.9% IV SOLUTION: Freq: Once | INTRAVENOUS | Status: DC

## 2019-06-30 MED ORDER — MAGNESIUM SULFATE 2 GM/50ML IV SOLN
2.0000 g | Freq: Once | INTRAVENOUS | Status: AC
  Administered 2019-06-30: 2 g via INTRAVENOUS
  Filled 2019-06-30: qty 50

## 2019-06-30 MED ORDER — MIDAZOLAM HCL 2 MG/2ML IJ SOLN: 1.0000 mg | INTRAMUSCULAR | Status: DC | PRN

## 2019-06-30 MED ORDER — FENTANYL 2500MCG IN NS 250ML (10MCG/ML) PREMIX INFUSION
0.0000 ug/h | INTRAVENOUS | Status: DC
  Administered 2019-06-30: 11:00:00 25 ug/h via INTRAVENOUS
  Administered 2019-06-30: 200 ug/h via INTRAVENOUS
  Administered 2019-07-02: 25 ug/h via INTRAVENOUS
  Filled 2019-06-30 (×3): qty 250

## 2019-06-30 MED ORDER — KCL-LACTATED RINGERS 20 MEQ/L IV SOLN
INTRAVENOUS | Status: AC
  Filled 2019-06-30: qty 1000

## 2019-06-30 MED ORDER — DEXTROSE 50 % IV SOLN
INTRAVENOUS | Status: AC
  Administered 2019-06-30: 17:00:00 50 mL
  Filled 2019-06-30: qty 50

## 2019-06-30 NOTE — Progress Notes (Signed)
Spoke with poison control. They have no new information. They will continue to follow.

## 2019-06-30 NOTE — Consult Note (Signed)
OTOLARYNGOLOGY CONSULTATION    Primary Care Physician: Nicoletta Dress, MD Patient Location at Initial Consult: Inpatient Service: MICU Chief Complaint/Reason for Consult: epistaxis  History of Presenting Illness:    [name] is a  78 y.o. female presenting with  Bilateral epistaxis. This has been ongoing for several days, very intermittent. She is a transfer from [place] in place on left side. Unsure how long it has been there. Patient is intubated, sedated. RN at bedside has not noticed any epistaxis today. No oral bleeding. Has had some old blood in her OG tube. Patient coagulopathic and has received multiple blood products.   Medical Oncology has been consulted. History of recent hip fracture, PE, AKI on CKD, anticoagulation, COPD, DMII.   Past Medical History:  Diagnosis Date  . AAA (abdominal aortic aneurysm) ([place]) 02/26/2012  . Arthritis   . 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 Blood transfusion 2011  . Cancer (HCC)    BREAST  . Claudication of both lower extremities ([place]) 05/28/2017  . COPD (chronic obstructive pulmonary disease) ([place])   . Depression   . Diabetes mellitus    type 2 IDDM x 10 years  . Diabetes mellitus with peripheral vascular disease ([place]) 05/28/2017  . GERD (gastroesophageal reflux disease)   . Hip pain   . Hypertension   . Hypothyroidism   . Iliac artery occlusion ([place]) 02/26/2012  . Myocardial infarction [facility])    unsure, looks as if she may have had one in the past  . Peripheral vascular disease ([place])   . PVD (peripheral vascular disease) with claudication ([place]) 02/26/2012  . Sleep apnea    does not use cpap   . Type II or unspecified type diabetes mellitus with peripheral circulatory disorders, uncontrolled(250.72) 06/15/2014    Past Surgical History:  Procedure Laterality Date  . ABDOMINAL ANGIOGRAM N/A 01/31/2012   Procedure: ABDOMINAL ANGIOGRAM;   Surgeon: Conrad Bolivar, MD;  Location: [facility] CATH LAB;  Service: Cardiovascular;  Laterality: N/A;  . ABDOMINAL AORTOGRAM N/A 07/16/2017   Procedure: ABDOMINAL AORTOGRAM;  Surgeon: Serafina Mitchell, MD;  Location: [place] CV LAB;  Service: Cardiovascular;  Laterality: N/A;  . ABDOMINAL AORTOGRAM W/LOWER EXTREMITY N/A 06/24/2018   Procedure: ABDOMINAL AORTOGRAM W/LOWER EXTREMITY;  Surgeon: Serafina Mitchell, MD;  Location: [place] CV LAB;  Service: Cardiovascular;  Laterality: N/A;  . APPENDECTOMY    . BREAST SURGERY  2009   lumpectomy Rt breast  . CHOLECYSTECTOMY    . FEET SURGERY    . FEMORAL-POPLITEAL BYPASS GRAFT Right 07/09/2018   Procedure: RIGHT FEMORAL ARTERY TO BELOW THE KNEE POPLITEAL ARTERY BYPASS GRAFT USING SAPHENOUS VEIN;  Surgeon: Serafina Mitchell, MD;  Location: [place];  Service: Vascular;  Laterality: Right;  . GROIN DISSECTION Right 07/09/2018   Procedure: REDO RIGHT COMMON FEMORAL EXPLORATION;  Surgeon: Serafina Mitchell, MD;  Location: [facility] OR;  Service: Vascular;  Laterality: Right;  . LOWER EXTREMITY ANGIOGRAPHY Bilateral 07/16/2017   Procedure: Lower Extremity Angiography;  Surgeon: Serafina Mitchell, MD;  Location: [place] CV LAB;  Service: Cardiovascular;  Laterality: Bilateral;  . PR VEIN BYPASS GRAFT,AORTO-FEM-POP    . TUBAL LIGATION    . VEIN HARVEST Right 07/09/2018   Procedure: RIGHT LEG SAPHENOUS VEIN HARVEST;  Surgeon: Serafina Mitchell, MD;  Location: [facility] OR;  Service: Vascular;  Laterality: Right;    Family History  Problem Relation Age of Onset  . Heart disease  Mother   . Cancer Father        LUNG  . Cancer Sister        BONE  . Anesthesia problems Neg Hx     Social History   Socioeconomic History  . Marital status: Widowed    Spouse name: Not on file  . Number of children: Not on file  . Years of education: Not on file  . Highest education level: Not on file  Occupational History  . Not on file  Social Needs  . Financial resource strain: Not on  file  . Food insecurity    Worry: Not on file    Inability: Not on file  . Transportation needs    Medical: Not on file    Non-medical: Not on file  Tobacco Use  . Smoking status: Current Every Day Smoker    Packs/day: 0.50    Years: 65.00    Pack years: 32.50    Types: Cigarettes  . Smokeless tobacco: Never Used  Substance and Sexual Activity  . Alcohol use: Not Currently    Comment: "up to three weeks ago" 07/04/18  . Drug use: No  . Sexual activity: Not on file  Lifestyle  . Physical activity    Days per week: Not on file    Minutes per session: Not on file  . Stress: Not on file  Relationships  . Social Herbalist on phone: Not on file    Gets together: Not on file    Attends religious service: Not on file    Active member of club or organization: Not on file    Attends meetings of clubs or organizations: Not on file    Relationship status: Not on file  Other Topics Concern  . Not on file  Social History Narrative  . Not on file    No current facility-administered medications on file prior to encounter.    Current Outpatient Medications on File Prior to Encounter  Medication Sig Dispense Refill  . ACCU-CHEK AVIVA PLUS test strip     . Accu-Chek FastClix Lancets MISC     . aspirin EC 81 MG tablet Take 81 mg by mouth daily.    Marland Kitchen atenolol (TENORMIN) 100 MG tablet Take 100 mg by mouth daily.    Marland Kitchen atorvastatin (LIPITOR) 40 MG tablet Take 40 mg by mouth at bedtime.  5  . gabapentin (NEURONTIN) 300 MG capsule Take 300 mg by mouth daily.     . hydrALAZINE (APRESOLINE) 25 MG tablet Take 25 mg by mouth 3 (three) times daily.    . lansoprazole (PREVACID) 30 MG capsule Take 30 mg by mouth 2 (two) times daily.  5  . levothyroxine (SYNTHROID) 125 MCG tablet Take 125 mcg by mouth daily before breakfast.    . Multiple Vitamin (MULTIVITAMIN WITH MINERALS) TABS tablet Take 1 tablet by mouth daily.    Marland Kitchen PARoxetine (PAXIL) 40 MG tablet Take 40 mg by mouth every morning.     Marland Kitchen rOPINIRole (REQUIP) 4 MG tablet Take 4 mg by mouth at bedtime.    Marland Kitchen UNIFINE PENTIPS 31G X 5 MM MISC       Allergies  Allergen Reactions  . Penicillins Swelling and Rash    Has patient had a PCN reaction causing immediate rash, facial/tongue/throat swelling, SOB or lightheadedness with hypotension: No Has patient had a PCN reaction causing severe rash involving mucus membranes or skin necrosis: No Has patient had a PCN reaction that required hospitalization:  No Has patient had a PCN reaction occurring within the last 10 years:No If all of the above answers are "NO", then may proceed with Cephalosporin use.      Review of Systems: Unable to complete 2/2 mental status (intubated, sedated)   OBJECTIVE: Vital Signs: Vitals:   06/30/19 1100 06/30/19 1124  BP: 110/88 119/62  Pulse: 78 79  Resp: 20 (!) 26  Temp:  (!) 97.4 F (36.3 C)  SpO2: 93% 92%    I&O  Intake/Output Summary (Last 24 hours) at 06/30/2019 1228 Last data filed at 06/30/2019 0600 Gross per 24 hour  Intake 2757.93 ml  Output 420 ml  Net 2337.93 ml    Physical Exam General: Pale, frail, critically ill-appearing  Head/Face: Normocephalic, atraumatic. No scars or lesions. No sinus tenderness. Facial nerve intact and equal bilaterally.  No facial lacerations. Salivary glands non tender and without palpable masses Facial ecchymosis around ETT holder  Eyes: Globes well positioned, no proptosis Lids: No periorbital edema/ecchymosis. No lid laceration Conjunctiva: No chemosis, hemorrhage PERRL Extra occular movement: Full ROM bilaterally. No gaze restriction    Ears: No gross deformity. Normal external canal. Tympanic membrane intact bilaterally and without effusion  Hearing:  unable to assess  Nose: Small amount of slow intermittent ooze, right nostril Left nose with RhinoRocket in place  Mouth/Oropharynx: Edentulous, normal soft palate, orotracheally intubated.  No tonsillar enlargement, exudate, or lesions.  Pharyngeal walls symmetrical. Uvula midline. Tongue midline without lesions.  Neck: Trachea midline. No masses. No thyromegaly or nodules palpated. No crepitus.  Lymphatic: No lymphadenopathy in the neck.  Respiratory: No stridor or distress.  Cardiovascular: Regular rate and rhythm.  Extremities: No edema or cyanosis. Warm and well-perfused.  Skin: No scars or lesions on face or neck.  Neurologic: CN II-XII intact. Moving all extremities without gross abnormality.  Other:      Labs: Lab Results  Component Value Date   WBC 19.0 (H) 06/30/2019   HGB 8.3 (L) 06/30/2019   HCT 24.6 (L) 06/30/2019   PLT 149 (L) 06/30/2019   TRIG 56 07/12/2019   ALT 133 (H) 06/30/2019   AST 600 (H) 06/30/2019   NA 137 06/30/2019   K 3.5 06/30/2019   CL 107 06/30/2019   CREATININE 1.69 (H) 06/30/2019   BUN 34 (H) 06/30/2019   CO2 19 (L) 06/30/2019   TSH 1.031 07/18/2019   INR 2.4 (H) 06/30/2019   HGBA1C 6.4 (H) 07/08/2019     Review of Ancillary Data / Diagnostic Tests:  Revieded critical care records: "78 year old diabetic transferred from [place] on 9/7 for coagulopathy and acute respiratory failure.  She was admitted 8/28 for left hip fracture and underwent hemiarthroplasty on 8/29.  She was started on apixaban for suspected PE based on high probability VQ scan and new hypoxia on 9/3.  Apixaban was stopped 9/5 due to epistaxis, on transfer she had bleeding from IV sites, epistaxis and lower GI bleed with dark blood being aspirated from OG tube.  She was intubated for airway protection and hypoxia."   Lab review- Elevated INR, leukocytosis (18), Cr 1.69  Procedure: Nasal endoscopy with control of bilateral nasal hemorrhage, complex Findings: multi-site diffuse slow oozing on the right nasal sidewall, right mid-septum, left posterior septum and anterior septum.   Details: The left RhinoRocket was removed. Nares were anesthetized with 2%lidocaine and Afrin. The nose was suctioned with a Frazier  tip suction. The Flexible Slim AmbuScope was utilized to examine both nasal cavities, aid in suctioning and removing old  clot. Multiple passes with the scope and suction were required. Findings as noted above. Afrin and lidocaine mixture were reapplied. There was diffuse slow oozing, nothing amenable to cautery. Finally, Avitene microcellular fibular was utilized to line both nasal cavities. The oropharynx was then suctioned again. Afrin was dripped, just a couple drops, to help hold the absorbable packing in place.     ASSESSMENT:  78 y.o. female with epistaxis, intermittent. Likely a result of her coagulopathy. RhinoRocket has been removed. Replaced with absorbable packing material. No sites in nose appropriate for cautery. Her epistaxis will likely improve once her coagulopathy improves.   RECOMMENDATIONS: 1. Thrombocytopenia certainly increases risk of bleeding in this cold dry environment. Platelet transfusions as needed. 2. Maintain tight BP control, as hypertension will certainly increase epistaxis occurrences.  3. Reduce nasal manipulation - no nose picking, no tissues in the nose other than for dabbing. No nose blowing.   4. Once one epistaxis episode occurs, it is common for some minor dripping to occur again over the next couple of days. If it does, use Afrin nasal spray for ACTIVE bleeding on the bleeding side and hold very firm anterior nasal pressure for 10-15 mins for ACTIVE bleeding. Put the bottle of Afrin parallel to the floor and pour medicine in nose, not spray. Recommend keeping a bottle of Afrin at the bedside just in case. 5. Starting Thursday 9/10- Use Ocean nasal spray q2 hr while awake to moisten the nasal mucosa to decrease nasal crusting and cracking. Use nasal saline spray for congestion instead of blowing the nose. After using the nasal saline spray, there may be some old blood that comes out with the irrigation; this is normal.  6. May try bactroban ointment or Ayr Gel to  anterior nasal septum at night (vaseline is easier to obtain while inpatient) 7. If nose continues to bleed bright red blood after pouring Afrin and holding firm pressure for 15+ minutes, please contact ENT for assistance.    Gavin Pound, MD  [facility], [place] Office phone [phone]

## 2019-06-30 NOTE — Procedures (Signed)
Arterial Catheter Insertion Procedure Note [name] [id number] September 29, 1941  Procedure: Insertion of Arterial Catheter  Indications: Blood pressure monitoring  Procedure Details Consent: Risks of procedure as well as the alternatives and risks of each were explained to the (patient/caregiver).  Consent for procedure obtained. Time Out: Verified patient identification, verified procedure, site/side was marked, verified correct patient position, special equipment/implants available, medications/allergies/relevent history reviewed, required imaging and test results available.  Performed  Maximum sterile technique was used including antiseptics, cap, gloves, gown, hand hygiene, mask and sheet. Skin prep: Chlorhexidine; local anesthetic administered 20 gauge catheter was inserted into right femoral artery using the Seldinger technique. ULTRASOUND GUIDANCE USED: YES Evaluation Blood flow good; BP tracing good. Complications: No apparent complications.   Georgann Housekeeper, AGACNP-BC [place] Pager [phone] or [phone]  06/30/2019 3:36 PM

## 2019-06-30 NOTE — Consult Note (Signed)
[place] Nurse wound consult note Reason for Consult:Stage 2 pressure injuries to sacrum, bilateral heels and bony prominence at spine.  Has left hip operative site from ORIF surgery at [facility]. It is oozing blood.  Skin is weeping and oozing and weeping blood.  Hemoptysis and diffuse bleeding noted.    Wound type: Pressure injuries to heels and sacrum, lumbar spine. Partial thickness will pad and protect. Offload pressure and turn and reposition every two hours.  Pressure Injury POA: Yes-has been at [facility] 5 days for right hip Fx.  Measurement:  Sacrum:  1 cm x 1 cm x 0.1 cm  Right heel:  1 cm x 1 cm x 0.1 cm  Lumbar spine 1 cm x 1 cm x 0.1 cm  Wound [id number] and moist Drainage (amount, consistency, odor) scant weeping.  Periwound:Ecchmosis and oozing  Dressing procedure/placement/frequency: Silicone foam dressing to sacrum, spine and heels to pad, protect and promote healing.  Change every three days and PRN soilage.  Prevalon boots bilaterally.  Will not follow at this time.  Please re-consult if needed.  Domenic Moras MSN, RN, FNP-BC [facility] Wound, Ostomy, Continence Nurse Pager [phone]

## 2019-06-30 NOTE — Progress Notes (Signed)
Initial Nutrition Assessment  DOCUMENTATION CODES:   Severe malnutrition in context of chronic illness  INTERVENTION:    Start trickle tube feeding with Vital AF 1.2 at 20 ml/h (480 ml per day) to provide 576 kcal, 36 gm protein, 389 ml free water daily.   When able to advance TF rate, increase slowly by 10 ml every 4 hours to goal rate of 45 ml/h (1080 ml per day) to provide 1296 kcal, 81 gm protein, 876 ml free water daily.   Monitor magnesium, potassium, and phosphorus daily for at least 3 days, MD to replete as needed, as pt is at risk for refeeding syndrome given severe protein calorie malnutrition.  NUTRITION DIAGNOSIS:   Severe Malnutrition related to chronic illness(COPD) as evidenced by severe fat depletion, severe muscle depletion.  GOAL:   Patient will meet greater than or equal to 90% of their needs  MONITOR:   Vent status, TF tolerance, Skin, Labs  REASON FOR ASSESSMENT:   Ventilator, Consult Enteral/tube feeding initiation and management  ASSESSMENT:   78 yo female admitted from [facility] with hip fx s/p fall, PE, UTI. PMH includes hypothyroidism, PVD, breast cancer, HTN, DM-2, GERD, COPD.   Received MD Consult for TF initiation and management. OG tube in place. Discussed patient in ICU rounds and with RN today. Patient has had some bleeding, so plans for trickle feedings today per discussion with MD.   Patient is currently intubated on ventilator support MV: 7.4 L/min Temp (24hrs), Avg:94.1 F (34.5 C), Min:90.4 F (32.4 C), Max:98.3 F (36.8 C)  Propofol off  Labs reviewed. BUN 34 (H), creatinine 1.69 (H)  Medications reviewed and include levophed, vasopressin, novolog.  No weight loss noted on review of usual weights.   NUTRITION - FOCUSED PHYSICAL EXAM:    Most Recent Value  Orbital Region  Severe depletion  Upper Arm Region  Unable to assess  Thoracic and Lumbar Region  Severe depletion  Buccal Region  Unable to assess  Temple  Region  Severe depletion  Clavicle Bone Region  Severe depletion  Clavicle and Acromion Bone Region  Severe depletion  Scapular Bone Region  Unable to assess  Dorsal Hand  Unable to assess  Patellar Region  Moderate depletion  Anterior Thigh Region  Severe depletion  Posterior Calf Region  Severe depletion  Edema (RD Assessment)  Severe  Hair  Reviewed  Eyes  Unable to assess  Mouth  Unable to assess  Skin  Reviewed  Nails  Unable to assess       Diet Order:   Diet Order            Diet NPO time specified  Diet effective now              EDUCATION NEEDS:   Not appropriate for education at this time  Skin:  Skin Assessment: Skin Integrity Issues: Skin Integrity Issues:: Stage II, Stage I, Incisions Stage I: sacrum Stage II: heel, vertebral column Incisions: thigh  Last BM:  9/7  Height:   Ht Readings from Last 1 Encounters:  07/09/2019 5\' 2"  (1.575 m)    Weight:   Wt Readings from Last 1 Encounters:  06/30/19 61.8 kg    Ideal Body Weight:  50 kg  BMI:  Body mass index is 24.92 kg/m.  Estimated Nutritional Needs:   Kcal:  1206  Protein:  85-100 gm  Fluid:  1.5 L    Molli Barrows, RD, LDN, [place] Pager [phone] After Hours Pager [phone]

## 2019-06-30 NOTE — Consult Note (Signed)
Conferred with nurse as to pt's status, pt unavailable to speak, prayed for pt bedside, pt's son arrived. He said she hadn't been to church in a long while, had been saved a long time ago in a [employer]. I offered spontaneous prayer with him and nurse. He joined in the [employer], and thanked me for coming. I explained they could ask the nurse to call a chaplain anytime, and there was a chapel available for private prayer 24/7.  Rev. Eloise Levels Chaplain

## 2019-06-30 NOTE — Consult Note (Addendum)
[name]  [phone] [phone]    [name]  Referring MD:  Dr. Kara Mead  Reason for Referral: Coagulopathy  HPI: [name] is a 78 year old female with a past medical history significant for breast cancer, COPD, depression, diabetes, peripheral vascular disease, abdominal aortic aneurysm, history of MI, hypothyroidism, hypertension.  The patient was transferred from [facility] on 07/14/2019 for coagulopathy and acute respiratory failure.  Patient was admitted to [facility] on 06/19/2019 following a fall.  She was found to have a left hip fracture and underwent left hemiarthroplasty on 06/20/2019.  On admission on 06/19/2019, the patient's PT was 11.4, INR 1.1, PTT 25.1.  Creatinine on admission was 0.90.  The patient was started on aspirin 325 mg twice a day postoperatively for DVT prophylaxis.  Postoperatively, the patient developed increased lethargy, confusion, and hypoxia.  A VQ scan was obtained on 06/25/2019 which showed intermediate to high probability of pulmonary embolism.  It appears as though she was switched to Lovenox on 06/25/2019.  A note from 06/26/2019 states that the aspirin was discontinued however, notes from 9/5 and 9/6 seem to indicate that aspirin 325 mg twice a day was continued as DVT prophylaxis.  The patient was transitioned to Eliquis 5 mg twice a day on 06/27/2019, but it appears this was held on 06/28/2019 secondary to epistaxis.  It appears as though she will receive 2 doses of the Eliquis. The patient subsequently developed bleeding from her incision site and puncture sites.  She then started to bleed from her fingernail beds and developed hemoptysis.  She was started on FFP and given cryoprecipitate.  During her hospitalization hospital, she developed acute kidney injury with an elevation of her creatinine up to 1.5.  The patient was transferred to [facility] [address] for ICU bed placement.  Upon arrival  to our facility, her creatinine was 1.63, AST 661, ALT 151 alkaline phosphatase 201, total bilirubin 2.3, white blood cell count 15.3, hemoglobin 6.7, platelet count 170,000, PT 89.3, INR greater than 10.0, PTT 61, fibrinogen normal at 424, d-dimer 1.65.   The patient has received 4 units of FFP here at our facility.  She has received 2 units of packed red blood cells at our facility.  She received 2 additional at [facility] 06/25/2019.  She has received 10 mg of IV vitamin K as well as KCentra on 07/04/2019.  The patient is on the ventilator and is sedated.  She is on multiple pressors.  Outside records and current hospital records have been extensively reviewed.  Family reports no history of bleeding issues.  Was on aspirin 81 mg daily prior to admission to [facility]. Family also reports that she has been on Coumadin in the past and did not have any issues with bleeding.  Has had prior hip surgery with no issues of bleeding. Spoke with resident who states that today, the patient has continued to have oozing from her IV sites and a small amount of blood in her stool.  This has improved however.  No other concerns were reported.  Hematology was asked see the patient to make recommendations regarding coagulopathy.   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, unspecified extremity ([place]) 05/28/2017   Blood transfusion 2011   Cancer [facility])    BREAST   Claudication of both lower extremities ([place]) 05/28/2017  COPD (chronic obstructive pulmonary disease) (HCC)    Depression    Diabetes mellitus    type 2 IDDM x 10 years   Diabetes mellitus with peripheral vascular disease ([place]) 05/28/2017   GERD (gastroesophageal reflux disease)    Hip pain    Hypertension    Hypothyroidism    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   Myocardial infarction [facility])     unsure, looks as if she may have had one in the past   Peripheral vascular disease ([place])    PVD (peripheral vascular disease) with claudication ([place]) 02/26/2012   Sleep apnea    does not use cpap    Type II or unspecified type diabetes mellitus with peripheral circulatory disorders, uncontrolled(250.72) 06/15/2014  :    Past Surgical History:  Procedure Laterality Date   ABDOMINAL ANGIOGRAM N/A 01/31/2012   Procedure: ABDOMINAL ANGIOGRAM;  Surgeon: Conrad [place], MD;  Location: [facility] CATH LAB;  Service: Cardiovascular;  Laterality: N/A;   ABDOMINAL AORTOGRAM N/A 07/16/2017   Procedure: ABDOMINAL AORTOGRAM;  Surgeon: Serafina Mitchell, MD;  Location: [place] CV LAB;  Service: Cardiovascular;  Laterality: N/A;   ABDOMINAL AORTOGRAM W/LOWER EXTREMITY N/A 06/24/2018   Procedure: ABDOMINAL AORTOGRAM W/LOWER EXTREMITY;  Surgeon: Serafina Mitchell, MD;  Location: [place] CV LAB;  Service: Cardiovascular;  Laterality: N/A;   APPENDECTOMY     BREAST SURGERY  2009   lumpectomy Rt breast   CHOLECYSTECTOMY     FEET SURGERY     FEMORAL-POPLITEAL BYPASS GRAFT Right 07/09/2018   Procedure: RIGHT FEMORAL ARTERY TO BELOW THE KNEE POPLITEAL ARTERY BYPASS GRAFT USING SAPHENOUS VEIN;  Surgeon: Serafina Mitchell, MD;  Location: [facility] OR;  Service: Vascular;  Laterality: Right;   GROIN DISSECTION Right 07/09/2018   Procedure: REDO RIGHT COMMON FEMORAL EXPLORATION;  Surgeon: Serafina Mitchell, MD;  Location: [facility] OR;  Service: Vascular;  Laterality: Right;   LOWER EXTREMITY ANGIOGRAPHY Bilateral 07/16/2017   Procedure: Lower Extremity Angiography;  Surgeon: Serafina Mitchell, MD;  Location: [place] CV LAB;  Service: Cardiovascular;  Laterality: Bilateral;   PR VEIN BYPASS GRAFT,AORTO-FEM-POP     TUBAL LIGATION     VEIN HARVEST Right 07/09/2018   Procedure: RIGHT LEG SAPHENOUS VEIN HARVEST;  Surgeon: Serafina Mitchell, MD;  Location: [facility] OR;  Service: Vascular;  Laterality: Right;  :   CURRENT  MEDS: Current Facility-Administered Medications  Medication Dose Route Frequency Provider Last Rate Last Dose   chlorhexidine gluconate (MEDLINE KIT) (PERIDEX) 0.12 % solution 15 mL  15 mL Mouth Rinse BID Noemi Chapel P, DO   15 mL at 06/30/19 0800   Chlorhexidine Gluconate Cloth 2 % PADS 6 each  6 each Topical Daily Noemi Chapel P, DO   6 each at 07/17/2019 1330   feeding supplement (VITAL HIGH PROTEIN) liquid 1,000 mL  1,000 mL Per Tube Q24H Mosetta Anis, MD       fentaNYL (SUBLIMAZE) bolus via infusion 25 mcg  25 mcg Intravenous Q15 min PRN Rigoberto Noel, MD       fentaNYL (SUBLIMAZE) injection 25 mcg  25 mcg Intravenous Once Rigoberto Noel, MD       fentaNYL 2564mg in NS 2557m(1039mml) infusion-PREMIX  25-200 mcg/hr Intravenous Continuous AlvRigoberto NoelD 5 mL/hr at 06/30/19 1128 50 mcg/hr at 06/30/19 1128   insulin aspart (novoLOG) injection 0-9 Units  0-9 Units Subcutaneous Q4H ClaNoemi Chapel DO       lactated ringers with KCl 20 mEq/L  infusion   Intravenous Continuous Mosetta Anis, MD 50 mL/hr at 06/30/19 0848     levofloxacin (LEVAQUIN) IVPB 500 mg  500 mg Intravenous Q48H Dang, Thuy D, RPH       levothyroxine (SYNTHROID, LEVOTHROID) injection 62.5 mcg  62.5 mcg Intravenous Daily Mosetta Anis, MD       MEDLINE mouth rinse  15 mL Mouth Rinse 10 times per day Noemi Chapel P, DO   15 mL at 06/30/19 0547   midazolam (VERSED) injection 1 mg  1 mg Intravenous Q15 min PRN Rigoberto Noel, MD       midazolam (VERSED) injection 1 mg  1 mg Intravenous Q2H PRN Rigoberto Noel, MD       norepinephrine (LEVOPHED) 10m in 2561mpremix infusion  0-40 mcg/min Intravenous Titrated AlRigoberto NoelMD 45 mL/hr at 06/30/19 1045 12 mcg/min at 06/30/19 1045   pantoprazole (PROTONIX) injection 40 mg  40 mg Intravenous Q12H ClNoemi Chapel, DO   40 mg at 06/28/2019 2300   vasopressin (PITRESSIN) 40 Units in sodium chloride 0.9 % 250 mL (0.16 Units/mL) infusion  0.03 Units/min Intravenous  Continuous ClNoemi Chapel, DO 11.25 mL/hr at 06/30/19 0600 0.03 Units/min at 06/30/19 0600      Allergies  Allergen Reactions   Penicillins Swelling and Rash    Has patient had a PCN reaction causing immediate rash, facial/tongue/throat swelling, SOB or lightheadedness with hypotension: No Has patient had a PCN reaction causing severe rash involving mucus membranes or skin necrosis: No Has patient had a PCN reaction that required hospitalization: No Has patient had a PCN reaction occurring within the last 10 years:No If all of the above answers are "NO", then may proceed with Cephalosporin use.   :  Family History  Problem Relation Age of Onset   Heart disease Mother    Cancer Father        LUNG   Cancer Sister        BONE   Anesthesia problems Neg Hx   :  Social History   Socioeconomic History   Marital status: Widowed    Spouse name: Not on file   Number of children: Not on file   Years of education: Not on file   Highest education level: Not on file  Occupational History   Not on file  Social Needs   Financial resource strain: Not on file   Food insecurity    Worry: Not on file    Inability: Not on file   Transportation needs    Medical: Not on file    Non-medical: Not on file  Tobacco Use   Smoking status: Current Every Day Smoker    Packs/day: 0.50    Years: 65.00    Pack years: 32.50    Types: Cigarettes   Smokeless tobacco: Never Used  Substance and Sexual Activity   Alcohol use: Not Currently    Comment: "up to three weeks ago" 07/04/18   Drug use: No   Sexual activity: Not on file  Lifestyle   Physical activity    Days per week: Not on file    Minutes per session: Not on file   Stress: Not on file  Relationships   Social connections    Talks on phone: Not on file    Gets together: Not on file    Attends religious service: Not on file    Active member of club or organization: Not on file    Attends meetings of  clubs or  organizations: Not on file    Relationship status: Not on file   Intimate partner violence    Fear of current or ex partner: Not on file    Emotionally abused: Not on file    Physically abused: Not on file    Forced sexual activity: Not on file  Other Topics Concern   Not on file  Social History Narrative   Not on file  :  REVIEW OF SYSTEMS: Unable to obtain secondary to patient condition.  Exam: Patient Vitals for the past 24 hrs:  BP Temp Temp src Pulse Resp SpO2 Height Weight  06/30/19 1124 119/62 (!) 97.4 F (36.3 C) Axillary 79 (!) 26 92 % -- --  06/30/19 1100 110/88 -- -- 78 20 93 % -- --  06/30/19 1000 (!) 108/50 -- -- 74 16 97 % -- --  06/30/19 0900 109/85 -- -- 76 18 97 % -- --  06/30/19 0830 116/61 -- -- 74 (!) 26 99 % -- --  06/30/19 0800 (!) 112/55 -- -- 72 14 98 % -- --  06/30/19 0700 -- (!) 97 F (36.1 C) Axillary -- -- -- -- --  06/30/19 0630 (!) 108/48 -- -- 69 13 100 % -- --  06/30/19 0615 (!) 109/50 -- -- 69 13 100 % -- --  06/30/19 0600 (!) 114/49 -- -- 68 14 99 % -- --  06/30/19 0545 (!) 107/46 -- -- 68 13 99 % -- --  06/30/19 0530 (!) 105/44 -- -- 68 13 99 % -- --  06/30/19 0515 (!) 96/47 -- -- 68 14 100 % -- --  06/30/19 0500 (!) 96/48 -- -- 68 14 99 % -- --  06/30/19 0445 (!) 95/49 -- -- 67 13 99 % -- 136 lb 3.9 oz (61.8 kg)  06/30/19 0430 (!) 103/48 -- -- 67 13 98 % -- --  06/30/19 0415 (!) 104/49 -- -- 68 (!) 28 95 % -- --  06/30/19 0400 (!) 116/50 (!) 97.5 F (36.4 C) Oral 69 17 96 % -- --  06/30/19 0345 (!) 117/55 -- -- 69 16 94 % -- --  06/30/19 0330 (!) 117/49 -- -- 69 17 94 % -- --  06/30/19 0315 (!) 113/50 -- -- 68 17 95 % -- --  06/30/19 0300 (!) 108/49 -- -- 67 16 95 % -- --  06/30/19 0245 (!) 110/51 -- -- 68 17 94 % -- --  06/30/19 0230 (!) 110/52 -- -- 67 19 94 % -- --  06/30/19 0215 (!) 110/53 -- -- 67 19 93 % -- --  06/30/19 0200 (!) 106/55 -- -- 67 17 93 % -- --  06/30/19 0145 (!) 112/52 -- -- 68 19 92 % -- --  06/30/19 0130 (!)  118/53 -- -- 68 17 93 % -- --  06/30/19 0115 (!) 123/59 -- -- 68 17 93 % -- --  06/30/19 0100 (!) 117/55 -- -- 69 20 91 % -- --  06/30/19 0045 (!) 117/57 -- -- 68 16 94 % -- --  06/30/19 0030 (!) 113/51 -- -- 68 20 96 % -- --  06/30/19 0015 (!) 108/53 -- -- 67 (!) 22 100 % -- --  06/30/19 0014 -- -- -- 67 (!) 22 100 % -- --  06/30/19 0000 (!) 107/50 97.8 F (36.6 C) Oral 67 20 100 % -- --  06/26/2019 2345 (!) 107/53 -- -- 67 14 97 % -- --  07/01/2019 2330 (!) 114/57 -- --  67 17 97 % -- --  07/19/2019 2315 -- -- -- 69 17 90 % -- --  07/22/2019 2300 (!) 112/54 -- -- 71 20 91 % -- --  07/06/2019 2245 -- -- -- 70 18 92 % -- --  07/16/2019 2230 (!) 97/55 -- -- 70 20 92 % -- --  06/25/2019 2215 -- -- -- 70 19 93 % -- --  06/28/2019 2200 (!) 100/51 -- -- 70 20 96 % -- --  07/14/2019 2145 -- -- -- 68 20 97 % -- --  06/30/2019 2130 (!) 109/54 -- -- 67 (!) 23 98 % -- --  07/01/2019 2115 -- -- -- 67 18 99 % -- --  06/23/2019 2113 (!) 122/52 98.3 F (36.8 C) Oral 67 (!) 22 99 % -- --  07/05/2019 2100 (!) 119/59 -- -- 67 (!) 22 99 % -- --  07/10/2019 2045 -- -- -- 67 18 100 % -- --  07/16/2019 2030 (!) 117/52 -- -- 67 (!) 21 100 % -- --  06/24/2019 2015 -- -- -- 66 (!) 28 100 % -- --  07/13/2019 2000 (!) 119/54 -- -- 66 13 100 % -- --  07/07/2019 1946 -- (!) 97.5 F (36.4 C) -- -- -- -- -- --  07/18/2019 1945 -- (!) 97.5 F (36.4 C) Oral 65 17 100 % -- --  06/27/2019 1940 -- -- -- 65 18 100 % -- --  07/09/2019 1930 (!) 125/52 -- -- 65 14 100 % -- --  07/02/2019 1929 -- (!) 97.5 F (36.4 C) Oral 65 19 100 % -- --  06/27/2019 1915 -- -- -- 67 13 (!) 89 % -- --  06/26/2019 1900 (!) 88/51 -- -- 66 16 94 % -- --  07/01/2019 1845 -- -- -- 69 15 100 % -- --  06/26/2019 1830 139/60 -- -- 67 19 94 % -- --  06/25/2019 1801 (!) 112/55 -- -- 66 (!) 23 96 % -- --  07/19/2019 1800 (!) 112/55 (!) 93.6 F (34.2 C) Axillary 66 (!) 27 97 % -- --  07/08/2019 1745 -- (!) 93.5 F (34.2 C) Axillary 63 17 100 % -- --  07/01/2019 1740 (!) 84/47 (!) 93.5 F (34.2 C)  Axillary 63 17 100 % -- --  07/16/2019 1737 (!) 98/57 (!) 93.5 F (34.2 C) Axillary 64 (!) 22 100 % -- --  07/16/2019 1730 (!) 97/52 -- -- 63 16 100 % -- --  06/28/2019 1722 (!) 98/51 (!) 92.3 F (33.5 C) Axillary 62 15 100 % -- --  07/13/2019 1721 -- (!) 92.3 F (33.5 C) Axillary -- -- -- -- --  07/22/2019 1715 -- -- -- 62 15 100 % -- --  07/19/2019 1700 (!) 109/53 -- -- (!) 48 19 100 % -- --  07/19/2019 1645 (!) 92/50 (!) 92.1 F (33.4 C) Rectal (!) 59 15 91 % -- --  07/20/2019 1630 (!) 132/53 (!) 91.8 F (33.2 C) Rectal 63 14 90 % -- --  07/22/2019 1626 -- -- -- 62 (!) 22 91 % -- --  07/14/2019 1625 (!) 163/52 (!) 91.8 F (33.2 C) Rectal 60 19 93 % -- --  07/16/2019 1603 (!) 163/62 (!) 91.8 F (33.2 C) Rectal 61 15 100 % -- --  07/15/2019 1559 (!) 159/60 -- -- 61 14 100 % -- --  06/28/2019 1548 (!) 159/66 (!) 91.7 F (33.2 C) Rectal 61 15 100 % -- --  07/14/2019 1547 -- -- -- 60 12 100 % -- --  07/19/2019 1545 (!) 154/58 -- -- 60 11 100 % -- --  07/04/2019 1541 -- -- -- 60 15 100 % -- --  07/17/2019 1530 (!) 114/46 (!) 91.7 F (33.2 C) Rectal (!) 59 12 100 % -- --  07/12/2019 1515 (!) 106/40 (!) 91.7 F (33.2 C) Rectal (!) 59 12 100 % -- --  07/08/2019 1500 (!) 111/52 (!) 91.6 F (33.1 C) Rectal (!) 58 (!) 0 100 % -- --  06/23/2019 1400 (!) 104/44 -- -- 62 12 (!) 88 % -- --  07/05/2019 1300 (!) 107/54 -- -- 62 19 100 % -- --  07/12/2019 1230 (!) 73/37 -- -- 66 (!) 9 (!) 81 % -- --  07/19/2019 1223 (!) 69/43 (!) 90.4 F (32.4 C) Rectal -- -- -- '5\' 2"'  (1.575 m) 132 lb 11.5 oz (60.2 kg)    General: Frail, critically ill-appearing.   Eyes:  no scleral icterus.   ENT: ET tube in place.  Dried blood noted around oropharynx and nasal passage.  Mucous membranes are dry.    Respiratory: Diffuse crackles bilaterally.  Cardiovascular:  Regular rate and rhythm, S1/S2, without murmur, rub or gallop.  There was 1+ pedal edema.   GI:  abdomen was soft, flat, nontender, nondistended, without organomegaly.   Neuro: Sedated Skin:  Multiple areas of bruising noted at IV sites and puncture sites.  LABS:  Lab Results  Component Value Date   WBC 19.0 (H) 06/30/2019   HGB 8.3 (L) 06/30/2019   HCT 24.6 (L) 06/30/2019   PLT 149 (L) 06/30/2019   GLUCOSE 97 06/30/2019   TRIG 56 07/11/2019   ALT 133 (H) 06/30/2019   AST 600 (H) 06/30/2019   NA 137 06/30/2019   K 3.5 06/30/2019   CL 107 06/30/2019   CREATININE 1.69 (H) 06/30/2019   BUN 34 (H) 06/30/2019   CO2 19 (L) 06/30/2019   INR 2.4 (H) 06/30/2019   HGBA1C 6.4 (H) 07/04/2019    Dg Chest Port 1 View  Result Date: 06/24/2019 CLINICAL DATA:  78 year old female with central line placement. EXAM: PORTABLE CHEST 1 VIEW COMPARISON:  Chest radiograph dated 07/06/2019 and CT dated 01/06/2019 FINDINGS: Endotracheal and enteric tube in similar position. There has been interval placement of a left IJ central venous line with tip close to the cavoatrial junction. No pneumothorax. No significant interval change in the appearance of the lungs since the earlier radiograph. Stable cardiac silhouette. Atherosclerotic calcification of the aorta. No acute osseous pathology. IMPRESSION: Interval placement of a left IJ central venous line with tip close to the cavoatrial junction. No pneumothorax. Electronically Signed   By: Anner Crete M.D.   On: 06/28/2019 17:10   Dg Chest Port 1 View  Result Date: 07/20/2019 CLINICAL DATA:  Status post intubation. EXAM: PORTABLE CHEST 1 VIEW COMPARISON:  07/19/2019 FINDINGS: The endotracheal tube tip is stable above the carina. Enteric tube tip is below the GE junction. Normal heart size. Bilateral pleural effusions are identified, increased from previous exam. Diffuse bilateral interstitial and airspace opacities are again noted and appear unchanged. IMPRESSION: 1. Satisfactory position of ET tube and enteric tube. 2. No change in aeration of both lungs. 3. Increase in bilateral pleural effusions. Electronically Signed   By: Kerby Moors M.D.   On:  07/08/2019 14:06    ASSESSMENT AND PLAN:   [name] has developed acute hemorrhagic shock due to multiple reasons including secondary to aspirin and exaggerated effect of Eliquis in the presence of renal insufficiency.  She has no evidence of DIC given her normal fibrinogen level and platelets.  Per family, patient has no history of bleeding and has tolerated warfarin well in the past.  Additionally, she had no excessive bleeding following her recent hip surgery.  Recommendations 1.  Monitor daily PT/INR and PTT 2.  Recommend vitamin K 10 mg either IV or via feeding tube daily for 3 more days 3.  Continue to hold anticoagulation for the next few days.  If PCCM feels high likelihood of PE, recommend restarting anticoagulation with IV heparin.  Thank you for this referral.  Alicia Bussing, DNP, AGPCNP-BC, AOCNP Ms. Buhrman was examined.  I reviewed the chart and discussed the case with Dr. Elsworth Soho.  I updated her family at the bedside.  She developed respiratory failure following hip surgery.  She was maintained on aspirin prophylaxis and then converted to Lovenox/apixaban after a high probability V/Q scan.  She was transferred to [facility] yesterday with diffuse hemorrhage, coagulopathy, and severe anemia.  She appears to have developed hemorrhagic shock with abnormal liver enzymes and renal failure.  She was treated with multiple blood products at [place] and here.  This included treatment with prothrombin concentrate yesterday.  The acute bleeding appears to have resolved.  The coagulation parameters are improved today.  I suspect the bleeding was related to toxicity from high-dose aspirin therapy in combination with apixaban in this elderly patient with renal insufficiency.  She does not appear to have DIC with the normal fibrinogen level and platelet count.  Her family report she has no previous history of bleeding with procedures and there is no family history of a bleeding  disorder.  Recommendations: 1.  Management of multiorgan failure per critical care medicine 2.  Hold anticoagulation therapy 3.  Monitor CBC and coagulation parameters 4.  Continue vitamin K

## 2019-06-30 NOTE — Progress Notes (Addendum)
CRITICAL VALUE ALERT  Critical Value:  Hemoglobin 6.1  Date & Time Notied:  9/8 1743  Provider Notified: Elsworth Soho  Orders Received/Actions taken: 2 units of PRBC

## 2019-06-30 NOTE — Progress Notes (Addendum)
78 year old diabetic transferred from [place] on 9/7 for coagulopathy and acute respiratory failure.  She was admitted 8/28 for left hip fracture and underwent hemiarthroplasty on 8/29.  She was started on apixaban for suspected PE based on high probability VQ scan and new hypoxia on 9/3.  Apixaban was stopped 9/5 due to epistaxis, on transfer she had bleeding from IV sites, epistaxis and lower GI bleed with dark blood being aspirated from OG tube.  She was intubated for airway protection and hypoxia.  She was hypotensive requiring pressors, bleeding seems to have decreased, dried blood noted around nostrils, elderly frail small woman intubated and sedated, unresponsive, severe bruising and ecchymosis on both forearms, soft nontender abdomen, S1-S2 tacky, 1+ edema.  Labs show decreased INR to 2.4, decrease PTT to 43, slight increased leukocytosis, hemoglobin improved from 5.6-8.3, stable platelets, stable creatinine 1.69.  Chest x-ray personally reviewed which shows bilateral diffuse airspace disease.  Impression/plan  Coagulopathy-not sure what is causing this, attributed apixaban, has received FFP and cryo at [facility] and [place] here, no evidence of DIC since platelets holding and fibrinogen normal, bleeding seems to have decreased now. -We will hold off on further FFP since she is in therapeutic range for recent PE , at some point we will need to reverse anticoagulation -Monitor serial hemoglobin for further bleeding -Ask ENT whether we can remove nasal packing Await hematology input  Hemorrhagic versus septic shock-continue Levophed, aim for systolic of [id number] rather than MEP since diastolic is very low -Can DC vasopressin  Acute hypoxic respiratory failure-continue low tidal volume ventilation, increase PEEP to 10 and attempt to decrease FiO2  AKI -anuric currently, hopefully this will reverse as blood pressure improves.  Elevated LFTs-may be related to shock liver, doubt Tylenol  toxicity, DC acetylcysteine, Start trickle tube feeds  Updated daughter and 2 sons at the bedside in detail, discussed advanced directives, full code for now, they will discuss about care limitations  The patient is critically ill with multiple organ systems failure and requires high complexity decision making for assessment and support, frequent evaluation and titration of therapies, application of advanced monitoring technologies and extensive interpretation of multiple databases. Critical Care Time devoted to patient care services described in this note independent of APP/resident  time is 40 minutes.   Leanna Sato Elsworth Soho MD

## 2019-06-30 NOTE — Progress Notes (Addendum)
NAME:  [name], MRN:  [id number], DOB:  06-Jun-1941, LOS: 1 ADMISSION DATE:  07/14/2019, CONSULTATION DATE:  07/09/2019 REFERRING MD:  Oval Linsey, CHIEF COMPLAINT:  Bleeding   Brief History   [name] is a 78 yo F w/ PMH of Hypothyroidism, PAD, HFpEF, HTN, DM and admit for fall at [facility] with hip fracture. Hospital course complicated by PE and UTI now transferred to [facility] for intubation and pressor support after developing diffuse bleeding on hospital day 9. Now found to be in DIC requiring 4 units FFP and 2 units pRBC.  Significant Hospital Events   8/28 Admit to [facility] 8/29 L Hemiarthroplasty 9/2 UTI, started on abx 9/3 V/Q scan diagnosed LUL defect started on eliquis for PE 9/5 Bleeding events started, eliquis d/c 9/7 Worsening bleed w/ hypotension requiring pressors 9/7 Intubated at [facility] 9/7 Transfer to [facility]  Consults:  Heme/Onc (telephone)  Procedures:  8/29 L hemiarthroplasty 9/7 Intubation @ [facility] 9/7 R IJ placement>>  Significant Diagnostic Tests:  9/3 @ [facility]: LUL ventilation/perfusion defect 9/2 Chest X-ray personally reviewed: poor inspiratory efforts, bilateral infiltrates, vascular congestion. 9/7 Chest X-ray personally reviewed: No pleural effusion, Bilateral diffuse opacities R > L.   Micro Data:  9/7 BCx pending 9/7 UCx pending 9/7 RespCx pending 9/8 MRSA PCR negative  Antimicrobials:  9/2 Levaquin >>> 9/3 Aztreonam > 9/7 9/2 Diflucan > 9/4 9/7 Vanc >>9/8  Interim history/subjective:  [name] was examined and evaluated at bedside this am. She was sedated and does not awaken to verbal or painful stimuli.  Objective   Blood pressure (!) 108/48, pulse 69, temperature (!) 97.5 F (36.4 C), temperature source Oral, resp. rate 13, height '5\' 2"'  (1.575 m), weight 61.8 kg, SpO2 100 %.    Vent Mode: PRVC FiO2 (%):  [60 %-100 %] 80 % Set Rate:  [14 bmp] 14 bmp Vt Set:  [300 mL-400 mL] 300 mL PEEP:  [5 cmH20-8 cmH20] 8 cmH20 Plateau  Pressure:  [11 cmH20-20 cmH20] 15 cmH20   Intake/Output Summary (Last 24 hours) at 06/30/2019 0802 Last data filed at 06/30/2019 0600 Gross per 24 hour  Intake 2757.93 ml  Output 420 ml  Net 2337.93 ml   Filed Weights   07/20/2019 1223 06/30/19 0445  Weight: 60.2 kg 61.8 kg   Examination: Gen: Chronically ill-appearing HEENT: ET tube in place, dried blood noted around oropharynx and nasal passage. CV: Regular rate, regular rhythm, S1, S2 normal Pulm: Bilateral diffuse crackles Abd: Soft, hypoactive bowel sounds, non-distended Extm: ROM intact, Peripheral pulses intact, 1+ peripheral edema. Oozing bleed around bandaged upper extremities Skin: Pallor, Multiple areas of superficial oozing bleed most prominent in upper extremities  Resolved Hospital Problem list     Assessment & Plan:   Diffuse bleeding 2/2 DOAC use Severe diffuse bleeding appear to have slowed. INR 10->2.4 this am. Platelets 149. No schistocytes and fibrinogen wnl r/o DIC. Unclear etiology of coagulopathy.  - Trend CBC q6 hr - Transfuse if <7 - Wound care consult - Appreciate heme/onc recs: monitor for further bleed, get mixing studies if non-resolved   Hypovolemic Shock 2/2 active bleed Hypotensive at [facility] w/o improvement w/ 500cc LR. Current MAP~60 on levophed - C/w levophed, vasopressin to keep MAP >65 - Monitor vitals - I/Os - Start fluid supplementation LR 50cc/hr for now, will increase after checking CVP  Hypothermia, Leukocytosis 2/2 Sepsis vs Severe anemia Broad spectrum abx started in [place] on 9/2 for presumed UTI. No positive culture data. Unclear etiology of infection but possibly pneumonia based  on chest X-ray findings. MRSA PCR negative - C/w bair hugger - D/c vancomycin - C/w levaquin - F/u blood/urine cultures  Transaminatis 2/2 Tylenol Overdose Overnight provider paged regarding elevated Tylenol level on [place]. Acetaminophen level on admission 31, trend down to 21 this am. Started on  NAC overnight. AST 600, ALT 133, Alk phos 183. Possible medication induced hepatic injury but tylenol level not significantly elevated - D/c NAC - Trend CMP  Hypoxic respiratory failure requiring intubation Pulmonary Embolism Increasing oxygen requirment this am FiO2 60->80 this am. Current O2sat stable. PEEP 8, TV 300. Per chart review, appears to have had bilateral pulmonary opacities prior to bleeding events. Differential includes pneumonia vs aspiration. - Stat ABGs - C/w intubation with PRVC keep O2 sat >90 , plateau <30 - VAP protocol - SBT as appropriate - Sedation w/ propofol, fentanyl, goal RASS-1 - Hold diuresis in setting of hypotension - Hold anticoagulation in setting of bleed  Acute Kidney Injury 2/2 ATN AM creatinine 1.69 this am. Baseline creatinine 1.0. Likely due to hemorrhagic shock. No hx of chronic kidney disease - Fluid resuscitation as described above - Trend renal function - Avoid nephrotoxic meds  T2DM - SSI, glucose checks  Hypothyroidism TSH 1.031. Current dose appropriate - C/w home dose as IV: IV levothyroxine 62.7mg  Depression/Anxiety Paroxetine 463mat home - Holding home psych meds for NPO  HTN - Holding home antihypertensives in setting of NPO  Best practice:  Diet: NPO Pain/Anxiety/Delirium protocol (if indicated): Propofol VAP protocol (if indicated): Y DVT prophylaxis: Currently in active bleed GI prophylaxis: Pantoprazole Glucose control: SSI Mobility: BR Code Status: Full Family Communication: Discussed with son at bedside regarding current critical status Disposition: ICU  Labs   CBC: Recent Labs  Lab 07/06/2019 1310 07/06/2019 1356 07/11/2019 1411 07/08/2019 1427 07/19/2019 1840 06/28/2019 2232 06/30/19 0548  WBC 15.3*  --   --   --  15.5* 14.1* 19.0*  NEUTROABS 13.8*  --   --   --   --   --   --   HGB 6.7*  --  7.1*  --  5.8* 7.8* 8.3*  HCT 20.8*  --  21.0*  --  17.2* 23.4* 24.6*  MCV 93.3  --   --   --  91.5 91.8 91.8  PLT  170 219  --  196 133* 132* 149*    Basic Metabolic Panel: Recent Labs  Lab 07/17/2019 1354 07/21/2019 1411 07/08/2019 2232 06/30/19 0440  NA 135 137 136 137  K 4.8 3.7 3.7 3.5  CL 108  --  106 107  CO2 20*  --  19* 19*  GLUCOSE 88  --  124* 97  BUN 36*  --  33* 34*  CREATININE 1.63*  --  1.60* 1.69*  CALCIUM 6.9*  --  7.0* 7.0*   GFR: Estimated Creatinine Clearance: 23.4 mL/min (A) (by C-G formula based on SCr of 1.69 mg/dL (H)). Recent Labs  Lab 07/04/2019 1310 07/10/2019 1840 06/28/2019 2103 06/27/2019 2232 06/30/19 0548  WBC 15.3* 15.5*  --  14.1* 19.0*  LATICACIDVEN  --   --  2.6*  --   --     Liver Function Tests: Recent Labs  Lab 06/26/2019 1354 07/16/2019 2232 06/30/19 0440  AST 661* 524* 600*  ALT 151* 119* 133*  ALKPHOS 201* 157* 183*  BILITOT 2.3* 2.8* 3.1*  PROT 3.7* 3.6* 3.7*  ALBUMIN 1.7* 1.6* 1.6*   No results for input(s): LIPASE, AMYLASE in the last 168 hours. No results for  input(s): AMMONIA in the last 168 hours.  ABG    Component Value Date/Time   PHART 7.245 (L) 07/22/2019 1411   PCO2ART 40.7 07/19/2019 1411   PO2ART 53.0 (L) 07/15/2019 1411   HCO3 18.7 (L) 07/21/2019 1411   TCO2 20 (L) 07/15/2019 1411   ACIDBASEDEF 9.0 (H) 07/06/2019 1411   O2SAT 90.0 06/25/2019 1411     Coagulation Profile: Recent Labs  Lab 07/18/2019 1356 07/11/2019 1427 06/30/2019 1840 07/14/2019 2232 06/30/19 0440  INR SPECIMEN HEMOLYZED. HEMOLYSIS MAY AFFECT INTEGRITY OF RESULTS. >10.0* 2.1* 2.2* 2.4*    Cardiac Enzymes: No results for input(s): CKTOTAL, CKMB, CKMBINDEX, TROPONINI in the last 168 hours.  HbA1C: Hgb A1c MFr Bld  Date/Time Value Ref Range Status  07/14/2019 01:55 PM 6.4 (H) 4.8 - 5.6 % Final    Comment:    (NOTE) Pre diabetes:          5.7%-6.4% Diabetes:              >6.4% Glycemic control for   <7.0% adults with diabetes   07/09/2018 03:04 PM 9.9 (H) 4.8 - 5.6 % Final    Comment:    (NOTE)         Prediabetes: 5.7 - 6.4         Diabetes: >6.4          Glycemic control for adults with diabetes: <7.0     CBG: Recent Labs  Lab 07/07/2019 1534 07/02/2019 2012 07/15/2019 2326 06/30/19 0421 06/30/19 0743  GLUCAP 88 95 109* 95 85    Review of Systems:   Unable to assess due to sedation  Past Medical History  She,  has a past medical history of AAA (abdominal aortic aneurysm) ([place]) (02/26/2012), Arthritis, 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, unspecified extremity ([place]) (05/28/2017), Blood transfusion (2011), Cancer [facility]), Claudication of both lower extremities ([place]) (05/28/2017), COPD (chronic obstructive pulmonary disease) ([place]), Depression, Diabetes mellitus, Diabetes mellitus with peripheral vascular disease ([place]) (05/28/2017), GERD (gastroesophageal reflux disease), Hip pain, Hypertension, Hypothyroidism, Iliac artery occlusion ([place]) (02/26/2012), Myocardial infarction [facility]), Peripheral vascular disease ([place]), PVD (peripheral vascular disease) with claudication ([place]) (02/26/2012), Sleep apnea, and Type II or unspecified type diabetes mellitus with peripheral circulatory disorders, uncontrolled(250.72) (06/15/2014).   Surgical History    Past Surgical History:  Procedure Laterality Date  . ABDOMINAL ANGIOGRAM N/A 01/31/2012   Procedure: ABDOMINAL ANGIOGRAM;  Surgeon: Conrad [place], MD;  Location: [facility] CATH LAB;  Service: Cardiovascular;  Laterality: N/A;  . ABDOMINAL AORTOGRAM N/A 07/16/2017   Procedure: ABDOMINAL AORTOGRAM;  Surgeon: Serafina Mitchell, MD;  Location: [place] CV LAB;  Service: Cardiovascular;  Laterality: N/A;  . ABDOMINAL AORTOGRAM W/LOWER EXTREMITY N/A 06/24/2018   Procedure: ABDOMINAL AORTOGRAM W/LOWER EXTREMITY;  Surgeon: Serafina Mitchell, MD;  Location: [place] CV LAB;  Service: Cardiovascular;  Laterality: N/A;  . APPENDECTOMY    . BREAST SURGERY  2009   lumpectomy Rt breast  . CHOLECYSTECTOMY    . FEET SURGERY    . FEMORAL-POPLITEAL BYPASS GRAFT Right 07/09/2018   Procedure: RIGHT FEMORAL  ARTERY TO BELOW THE KNEE POPLITEAL ARTERY BYPASS GRAFT USING SAPHENOUS VEIN;  Surgeon: Serafina Mitchell, MD;  Location: [place];  Service: Vascular;  Laterality: Right;  . GROIN DISSECTION Right 07/09/2018   Procedure: REDO RIGHT COMMON FEMORAL EXPLORATION;  Surgeon: Serafina Mitchell, MD;  Location: [facility] OR;  Service: Vascular;  Laterality: Right;  . LOWER EXTREMITY ANGIOGRAPHY Bilateral 07/16/2017   Procedure: Lower Extremity Angiography;  Surgeon: Serafina Mitchell, MD;  Location: [place] CV LAB;  Service: Cardiovascular;  Laterality: Bilateral;  . PR VEIN BYPASS GRAFT,AORTO-FEM-POP    . TUBAL LIGATION    . VEIN HARVEST Right 07/09/2018   Procedure: RIGHT LEG SAPHENOUS VEIN HARVEST;  Surgeon: Serafina Mitchell, MD;  Location: [facility] OR;  Service: Vascular;  Laterality: Right;     Social History   reports that she has been smoking cigarettes. She has a 32.50 pack-year smoking history. She has never used smokeless tobacco. She reports previous alcohol use. She reports that she does not use drugs.   Family History   Her family history includes Cancer in her father and sister; Heart disease in her mother. There is no history of Anesthesia problems.   Allergies Allergies  Allergen Reactions  . Penicillins Swelling and Rash    Has patient had a PCN reaction causing immediate rash, facial/tongue/throat swelling, SOB or lightheadedness with hypotension: No Has patient had a PCN reaction causing severe rash involving mucus membranes or skin necrosis: No Has patient had a PCN reaction that required hospitalization: No Has patient had a PCN reaction occurring within the last 10 years:No If all of the above answers are "NO", then may proceed with Cephalosporin use.      Home Medications  Prior to Admission medications   Medication Sig Start Date End Date Taking? Authorizing Provider  ACCU-CHEK AVIVA PLUS test strip  02/17/19   [provider]  Accu-Chek FastClix Lancets MISC  02/17/19    [provider]  alendronate (FOSAMAX) 70 MG tablet Take 70 mg by mouth once a week. Take with a full glass of water on an empty stomach.  Takes on Friday    [provider]  amLODipine-benazepril (LOTREL) 5-40 MG capsule Take 1 capsule by mouth at bedtime. 05/15/17   [provider]  anastrozole (ARIMIDEX) 1 MG tablet  02/17/19   [provider]  aspirin 325 MG tablet Take 325 mg by mouth daily.    [provider]  atenolol (TENORMIN) 100 MG tablet Take 100 mg by mouth daily.    [provider]  atorvastatin (LIPITOR) 40 MG tablet Take 40 mg by mouth at bedtime. 05/15/17   [provider]  gabapentin (NEURONTIN) 300 MG capsule Take 300 mg by mouth 2 (two) times daily.    [provider]  hydrALAZINE (APRESOLINE) 25 MG tablet Take 25 mg by mouth 3 (three) times daily.    [provider]  Insulin Glargine (LANTUS SOLOSTAR) 100 UNIT/ML Solostar Pen Inject 20 Units into the skin daily at 10 pm. Patient taking differently: Inject 10 Units into the skin daily at 10 pm.  07/16/18   Rhyne, Hulen Shouts, PA-C  lansoprazole (PREVACID) 30 MG capsule Take 30 mg by mouth 2 (two) times daily. 05/15/17   [provider]  levothyroxine (SYNTHROID, LEVOTHROID) 137 MCG tablet Take 137 mcg by mouth daily before breakfast.    [provider]  losartan (COZAAR) 100 MG tablet Take 100 mg by mouth daily with breakfast. 06/20/17   [provider]  nicotine (NICODERM CQ - DOSED IN MG/24 HOURS) 14 mg/24hr patch Place 14 mg onto the skin daily.    [provider]  PARoxetine (PAXIL) 40 MG tablet Take 40 mg by mouth every morning.    [provider]  potassium chloride SA (K-DUR,KLOR-CON) 20 MEQ tablet Take 20 mEq by mouth daily.    [provider]  rOPINIRole (REQUIP) 4 MG tablet Take  4 mg by mouth at bedtime.    [provider]  torsemide (DEMADEX) 20 MG tablet Take 40 mg by mouth daily.     [provider]  Nicholes Calamity 31G X 5 MM [place]  02/17/19   [provider]     Critical care time:     Attending attestation to follow  Gilberto Better, MD PGY2, [place] [facility] Pager: [phone]

## 2019-07-01 ENCOUNTER — Inpatient Hospital Stay (HOSPITAL_COMMUNITY): Payer: Medicare HMO

## 2019-07-01 ENCOUNTER — Other Ambulatory Visit (HOSPITAL_COMMUNITY): Payer: Medicare HMO

## 2019-07-01 DIAGNOSIS — R578 Other shock: Secondary | ICD-10-CM

## 2019-07-01 DIAGNOSIS — E46 Unspecified protein-calorie malnutrition: Secondary | ICD-10-CM

## 2019-07-01 LAB — CBC WITH DIFFERENTIAL/PLATELET
Abs Immature Granulocytes: 0 10*3/uL (ref 0.00–0.07)
Basophils Absolute: 0 10*3/uL (ref 0.0–0.1)
Basophils Relative: 0 %
Eosinophils Absolute: 0.2 10*3/uL (ref 0.0–0.5)
Eosinophils Relative: 1 %
HCT: 23.2 % — ABNORMAL LOW (ref 36.0–46.0)
Hemoglobin: 7.5 g/dL — ABNORMAL LOW (ref 12.0–15.0)
Lymphocytes Relative: 11 %
Lymphs Abs: 1.7 10*3/uL (ref 0.7–4.0)
MCH: 30.2 pg (ref 26.0–34.0)
MCHC: 32.3 g/dL (ref 30.0–36.0)
MCV: 93.5 fL (ref 80.0–100.0)
Monocytes Absolute: 0.3 10*3/uL (ref 0.1–1.0)
Monocytes Relative: 2 %
Neutro Abs: 13.1 10*3/uL — ABNORMAL HIGH (ref 1.7–7.7)
Neutrophils Relative %: 86 %
Platelets: 134 10*3/uL — ABNORMAL LOW (ref 150–400)
RBC: 2.48 MIL/uL — ABNORMAL LOW (ref 3.87–5.11)
RDW: 15.6 % — ABNORMAL HIGH (ref 11.5–15.5)
WBC: 15.2 10*3/uL — ABNORMAL HIGH (ref 4.0–10.5)
nRBC: 1.4 % — ABNORMAL HIGH (ref 0.0–0.2)
nRBC: 2 /100 WBC — ABNORMAL HIGH

## 2019-07-01 LAB — POCT I-STAT 7, (LYTES, BLD GAS, ICA,H+H)
Acid-base deficit: 13 mmol/L — ABNORMAL HIGH (ref 0.0–2.0)
Acid-base deficit: 10 mmol/L — ABNORMAL HIGH (ref 0.0–2.0)
Acid-base deficit: 10 mmol/L — ABNORMAL HIGH (ref 0.0–2.0)
Acid-base deficit: 7 mmol/L — ABNORMAL HIGH (ref 0.0–2.0)
Bicarbonate: 16.7 mmol/L — ABNORMAL LOW (ref 20.0–28.0)
Bicarbonate: 18.6 mmol/L — ABNORMAL LOW (ref 20.0–28.0)
Bicarbonate: 18.5 mmol/L — ABNORMAL LOW (ref 20.0–28.0)
Bicarbonate: 21.6 mmol/L (ref 20.0–28.0)
Calcium, Ion: 1.04 mmol/L — ABNORMAL LOW (ref 1.15–1.40)
Calcium, Ion: 0.97 mmol/L — ABNORMAL LOW (ref 1.15–1.40)
Calcium, Ion: 1 mmol/L — ABNORMAL LOW (ref 1.15–1.40)
Calcium, Ion: 1.01 mmol/L — ABNORMAL LOW (ref 1.15–1.40)
HCT: 20 % — ABNORMAL LOW (ref 36.0–46.0)
HCT: 16 % — ABNORMAL LOW (ref 36.0–46.0)
HCT: 18 % — ABNORMAL LOW (ref 36.0–46.0)
HCT: 19 % — ABNORMAL LOW (ref 36.0–46.0)
Hemoglobin: 6.8 g/dL — CL (ref 12.0–15.0)
Hemoglobin: 5.4 g/dL — CL (ref 12.0–15.0)
Hemoglobin: 6.1 g/dL — CL (ref 12.0–15.0)
Hemoglobin: 6.5 g/dL — CL (ref 12.0–15.0)
O2 Saturation: 79 %
O2 Saturation: 91 %
O2 Saturation: 92 %
O2 Saturation: 96 %
Patient temperature: 97.6
Patient temperature: 97.5
Patient temperature: 98.9
Patient temperature: 98.6
Potassium: 4.5 mmol/L (ref 3.5–5.1)
Potassium: 4.3 mmol/L (ref 3.5–5.1)
Potassium: 4 mmol/L (ref 3.5–5.1)
Potassium: 4.1 mmol/L (ref 3.5–5.1)
Sodium: 137 mmol/L (ref 135–145)
Sodium: 138 mmol/L (ref 135–145)
Sodium: 139 mmol/L (ref 135–145)
Sodium: 140 mmol/L (ref 135–145)
TCO2: 19 mmol/L — ABNORMAL LOW (ref 22–32)
TCO2: 20 mmol/L — ABNORMAL LOW (ref 22–32)
TCO2: 20 mmol/L — ABNORMAL LOW (ref 22–32)
TCO2: 23 mmol/L (ref 22–32)
pCO2 arterial: 62.5 mmHg — ABNORMAL HIGH (ref 32.0–48.0)
pCO2 arterial: 54.8 mmHg — ABNORMAL HIGH (ref 32.0–48.0)
pCO2 arterial: 55.6 mmHg — ABNORMAL HIGH (ref 32.0–48.0)
pCO2 arterial: 60.1 mmHg — ABNORMAL HIGH (ref 32.0–48.0)
pH, Arterial: 7.031 — CL (ref 7.350–7.450)
pH, Arterial: 7.135 — CL (ref 7.350–7.450)
pH, Arterial: 7.13 — CL (ref 7.350–7.450)
pH, Arterial: 7.164 — CL (ref 7.350–7.450)
pO2, Arterial: 62 mmHg — ABNORMAL LOW (ref 83.0–108.0)
pO2, Arterial: 79 mmHg — ABNORMAL LOW (ref 83.0–108.0)
pO2, Arterial: 86 mmHg (ref 83.0–108.0)
pO2, Arterial: 101 mmHg (ref 83.0–108.0)

## 2019-07-01 LAB — CBC
HCT: 21 % — ABNORMAL LOW (ref 36.0–46.0)
HCT: 23.6 % — ABNORMAL LOW (ref 36.0–46.0)
HCT: 24.4 % — ABNORMAL LOW (ref 36.0–46.0)
HCT: 24.4 % — ABNORMAL LOW (ref 36.0–46.0)
Hemoglobin: 6.8 g/dL — CL (ref 12.0–15.0)
Hemoglobin: 7.6 g/dL — ABNORMAL LOW (ref 12.0–15.0)
Hemoglobin: 8 g/dL — ABNORMAL LOW (ref 12.0–15.0)
Hemoglobin: 8.4 g/dL — ABNORMAL LOW (ref 12.0–15.0)
MCH: 29.7 pg (ref 26.0–34.0)
MCH: 30 pg (ref 26.0–34.0)
MCH: 30.7 pg (ref 26.0–34.0)
MCH: 31 pg (ref 26.0–34.0)
MCHC: 32.2 g/dL (ref 30.0–36.0)
MCHC: 32.4 g/dL (ref 30.0–36.0)
MCHC: 32.8 g/dL (ref 30.0–36.0)
MCHC: 34.4 g/dL (ref 30.0–36.0)
MCV: 90 fL (ref 80.0–100.0)
MCV: 91.7 fL (ref 80.0–100.0)
MCV: 93.3 fL (ref 80.0–100.0)
MCV: 93.5 fL (ref 80.0–100.0)
Platelets: 133 10*3/uL — ABNORMAL LOW (ref 150–400)
Platelets: 135 10*3/uL — ABNORMAL LOW (ref 150–400)
Platelets: 76 10*3/uL — ABNORMAL LOW (ref 150–400)
Platelets: 84 10*3/uL — ABNORMAL LOW (ref 150–400)
RBC: 2.29 MIL/uL — ABNORMAL LOW (ref 3.87–5.11)
RBC: 2.53 MIL/uL — ABNORMAL LOW (ref 3.87–5.11)
RBC: 2.61 MIL/uL — ABNORMAL LOW (ref 3.87–5.11)
RBC: 2.71 MIL/uL — ABNORMAL LOW (ref 3.87–5.11)
RDW: 15.2 % (ref 11.5–15.5)
RDW: 15.5 % (ref 11.5–15.5)
RDW: 15.6 % — ABNORMAL HIGH (ref 11.5–15.5)
RDW: 15.9 % — ABNORMAL HIGH (ref 11.5–15.5)
WBC: 10.4 10*3/uL (ref 4.0–10.5)
WBC: 11.4 10*3/uL — ABNORMAL HIGH (ref 4.0–10.5)
WBC: 13.9 10*3/uL — ABNORMAL HIGH (ref 4.0–10.5)
WBC: 17.5 10*3/uL — ABNORMAL HIGH (ref 4.0–10.5)
nRBC: 1.1 % — ABNORMAL HIGH (ref 0.0–0.2)
nRBC: 1.1 % — ABNORMAL HIGH (ref 0.0–0.2)
nRBC: 1.2 % — ABNORMAL HIGH (ref 0.0–0.2)
nRBC: 1.7 % — ABNORMAL HIGH (ref 0.0–0.2)

## 2019-07-01 LAB — CULTURE, RESPIRATORY W GRAM STAIN: Culture: NORMAL

## 2019-07-01 LAB — COMPREHENSIVE METABOLIC PANEL
ALT: 83 U/L — ABNORMAL HIGH (ref 0–44)
AST: 385 U/L — ABNORMAL HIGH (ref 15–41)
Albumin: 1 g/dL — ABNORMAL LOW (ref 3.5–5.0)
Alkaline Phosphatase: 217 U/L — ABNORMAL HIGH (ref 38–126)
Anion gap: 12 (ref 5–15)
BUN: 33 mg/dL — ABNORMAL HIGH (ref 8–23)
CO2: 17 mmol/L — ABNORMAL LOW (ref 22–32)
Calcium: 6.6 mg/dL — ABNORMAL LOW (ref 8.9–10.3)
Chloride: 109 mmol/L (ref 98–111)
Creatinine, Ser: 2.11 mg/dL — ABNORMAL HIGH (ref 0.44–1.00)
GFR calc Af Amer: 25 mL/min — ABNORMAL LOW (ref >60)
GFR calc non Af Amer: 22 mL/min — ABNORMAL LOW (ref >60)
Glucose, Bld: 113 mg/dL — ABNORMAL HIGH (ref 70–99)
Potassium: 4.5 mmol/L (ref 3.5–5.1)
Sodium: 138 mmol/L (ref 135–145)
Total Bilirubin: 2.5 mg/dL — ABNORMAL HIGH (ref 0.3–1.2)
Total Protein: <3 g/dL — ABNORMAL LOW (ref 6.5–8.1)

## 2019-07-01 LAB — GLUCOSE, CAPILLARY
Glucose-Capillary: 102 mg/dL — ABNORMAL HIGH (ref 70–99)
Glucose-Capillary: 104 mg/dL — ABNORMAL HIGH (ref 70–99)
Glucose-Capillary: 165 mg/dL — ABNORMAL HIGH (ref 70–99)
Glucose-Capillary: 179 mg/dL — ABNORMAL HIGH (ref 70–99)
Glucose-Capillary: 65 mg/dL — ABNORMAL LOW (ref 70–99)
Glucose-Capillary: 89 mg/dL (ref 70–99)

## 2019-07-01 LAB — PREPARE RBC (CROSSMATCH)

## 2019-07-01 LAB — PHOSPHORUS: Phosphorus: 6.9 mg/dL — ABNORMAL HIGH (ref 2.5–4.6)

## 2019-07-01 LAB — PROTIME-INR
INR: 2.5 — ABNORMAL HIGH (ref 0.8–1.2)
INR: 2 — ABNORMAL HIGH (ref 0.8–1.2)
Prothrombin Time: 26.7 seconds — ABNORMAL HIGH (ref 11.4–15.2)
Prothrombin Time: 22.1 seconds — ABNORMAL HIGH (ref 11.4–15.2)

## 2019-07-01 LAB — APTT
aPTT: 56 seconds — ABNORMAL HIGH (ref 24–36)
aPTT: 50 seconds — ABNORMAL HIGH (ref 24–36)

## 2019-07-01 LAB — PROCALCITONIN: Procalcitonin: 55.61 ng/mL

## 2019-07-01 LAB — MAGNESIUM: Magnesium: 2.1 mg/dL (ref 1.7–2.4)

## 2019-07-01 MED ORDER — SODIUM BICARBONATE 8.4 % IV SOLN
50.0000 meq | Freq: Once | INTRAVENOUS | Status: AC
  Administered 2019-07-01: 50 meq via INTRAVENOUS
  Filled 2019-07-01: qty 50

## 2019-07-01 MED ORDER — SODIUM BICARBONATE-DEXTROSE 150-5 MEQ/L-% IV SOLN
INTRAVENOUS | Status: DC
  Administered 2019-07-01 (×2): via INTRAVENOUS
  Filled 2019-07-01 (×3): qty 1000

## 2019-07-01 MED ORDER — DEXTROSE 50 % IV SOLN
25.0000 mL | Freq: Once | INTRAVENOUS | Status: AC
  Administered 2019-07-01: 25 mL via INTRAVENOUS
  Filled 2019-07-01: qty 50

## 2019-07-01 MED ORDER — LEVOTHYROXINE SODIUM 125 MCG PO TABS
125.0000 ug | ORAL_TABLET | Freq: Every day | ORAL | Status: DC
  Filled 2019-07-01: qty 1

## 2019-07-01 MED ORDER — SODIUM BICARBONATE 8.4 % IV SOLN
100.0000 meq | Freq: Once | INTRAVENOUS | Status: AC
  Administered 2019-07-01: 100 meq via INTRAVENOUS

## 2019-07-01 MED ORDER — SODIUM CHLORIDE 0.9% IV SOLUTION
Freq: Once | INTRAVENOUS | Status: AC
  Administered 2019-07-01: 12:00:00 via INTRAVENOUS

## 2019-07-01 MED ORDER — CALCIUM GLUCONATE-NACL 1-0.675 GM/50ML-% IV SOLN
1.0000 g | Freq: Once | INTRAVENOUS | Status: AC
  Administered 2019-07-01: 1000 mg via INTRAVENOUS
  Filled 2019-07-01: qty 50

## 2019-07-01 MED ORDER — SODIUM BICARBONATE 8.4 % IV SOLN
INTRAVENOUS | Status: AC
  Filled 2019-07-01: qty 100

## 2019-07-01 NOTE — Progress Notes (Signed)
NAME:  [name], MRN:  [id number], DOB:  January 06, 1941, LOS: 2 ADMISSION DATE:  07/02/2019, CONSULTATION DATE:  07/17/2019 REFERRING MD:  Oval Linsey, CHIEF COMPLAINT:  Bleeding   Brief History   [name] is a 78 yo F w/ PMH of Hypothyroidism, PAD, HFpEF, HTN, DM and admit for fall at [facility] with hip fracture. Hospital course complicated by PE and UTI now transferred to [facility] for intubation and pressor support after developing diffuse bleeding on hospital day 9. Now with prolonged bleeding with elevated INR requiring multiple transfusions.  Significant Hospital Events   8/28 Admit to [facility] 8/29 L Hemiarthroplasty 9/2 UTI, started on abx 9/3 V/Q scan diagnosed LUL defect started on eliquis for PE 9/5 Bleeding events started, eliquis d/c 9/7 Worsening bleed w/ hypotension requiring pressors 9/7 Intubated at [facility] 9/7 Transfer to [facility]  Consults:  Heme/Onc (telephone)  Procedures:  8/29 L hemiarthroplasty 9/7 Intubation @ Oval Linsey 9/7 R IJ placement>> 9/8 R femoral arterial line placement >>  Significant Diagnostic Tests:  9/3 @ Oval Linsey: LUL ventilation/perfusion defect 9/2 Chest X-ray personally reviewed: poor inspiratory efforts, bilateral infiltrates, vascular congestion. 9/7 Chest X-ray personally reviewed: No pleural effusion, Bilateral diffuse opacities R > L.  9/9 Chest X-ray personally review: Improving bilateral diffuse opacities. Now  W/ R pleural effusion  Micro Data:  9/7 BCx pending 9/7 UCx pending 9/7 RespCx pending 9/8 MRSA PCR negative  Antimicrobials:  9/2 Levaquin >>> 9/3 Aztreonam > 9/7 9/2 Diflucan > 9/4 9/7 Vanc >>9/8  Interim history/subjective:  [name] was examined and evaluated at bedside this am. She continues to be sedated despite d/c fentanyl dose. Currently unresponsive to verbal or painful stimuli  Objective   Blood pressure (!) 133/36, pulse 64, temperature (!) 97.5 F (36.4 C), resp. rate (!) 23, height 5\' 2"  (1.575 m), weight  62.7 kg, SpO2 100 %.    Vent Mode: PRVC FiO2 (%):  [50 %-70 %] 70 % Set Rate:  [14 bmp-24 bmp] 24 bmp Vt Set:  [300 mL] 300 mL PEEP:  [8 cmH20] 8 cmH20 Plateau Pressure:  [15 cmH20-20 cmH20] 17 cmH20   Intake/Output Summary (Last 24 hours) at 07/01/2019 1215 Last data filed at 07/01/2019 1200 Gross per 24 hour  Intake 3605.29 ml  Output 210 ml  Net 3395.29 ml   Filed Weights   07/17/2019 1223 06/30/19 0445 07/01/19 0500  Weight: 60.2 kg 61.8 kg 62.7 kg   Examination: Gen: ill-appearing F HEENT: ET tube in place, more bloody secretion from oropharynx CV: Regular rate, regular rhythm, S1, S2 normal Pulm: Bibasilar rales, no wheezing Abd: Soft, non-distended Extm: ROM intact, peripheral pulses intact, 2+ peripheral edema up to abdomen. Continued oozing bleed around upper extremities. Skin: Pallor, multiple areas of oozing blood  Resolved Hospital Problem list     Assessment & Plan:   Diffuse bleeding 2/2 DOAC use INR unchanged 2.4->2.5 Platelets 149->135. No schistocytes and fibrinogen wnl r/o DIC. Unclear etiology of coagulopathy. Vitamin K given per heme/onc. Continued bleed with hgb down to 6.5 overnight. Up to hgb 8.0 after unit of blood. - Appreciate heme/onc recs: Likely due to prolonged anti-platelet + anticoagulant, supportive care - Trend CBC q6 hr - Transfuse if <7 - Wound care consult  Hypovolemic Shock 2/2 active bleed Current MAP~60 on levophed. Able to titrate down to 89mcg. Anasarca on exam li - C/w levophed to keep MAP >65 - Monitor vitals - I/Os  Anasarca 2/2 hypoalbuminema Pitting edema on exam up to abdomen. Albumin 1.0 this am. - Transfuse  FFP x4 - C/w tube feeds  Acute Kidney Injury 2/2 ATN Worsening renal fx Creatinine 1.6->1.69->2.11. GFR down to 22%. No urine output overnight. - Renal US - Fluid resuscitation as described above - Trend renal function - Avoid nephrotoxic meds  Hypothermia, Leukocytosis 2/2 Sepsis vs Severe anemia Continues to  be hypothermic but leukocytosis improved 20.2->13.9. Unclear source of infection. Will check pro-calc to assess for ongoing infection. Post 7 days of abx - C/w bair hugger - C/w levaquin - F/u blood/urine cultures - Will get procalc to see if IV abc needed  Hypoxic respiratory failure requiring intubation Acute Respiratory Acidosis Pulmonary Embolism Blood gas this am showing pH 7.031, pCO2 62.5, pO2 consistent w/ respiratory acidosis with hypoxia. FiO2 60, PEEP 8, TV 300.  - Trend ABGs - Bicarb amp x2 - Start bicarb drip - C/w intubation with PRVC keep O2 sat >90 , plateau <30 - VAP protocol  - SBT as appropriate - Sedation w/ fentanyl, goal RASS-1 - Hold diuresis in setting of hypotension - Hold anticoagulation in setting of bleed  T2DM - SSI, glucose checks  Hypothyroidism TSH 1.031. Current dose appropriate - C/w home dose as IV: IV levothyroxine 62.48mcg  Depression/Anxiety Paroxetine 40mg  at home - Holding home psych meds for NPO  HTN - Holding home antihypertensives in setting of NPO  Best practice:  Diet: NPO Pain/Anxiety/Delirium protocol (if indicated): Fentanyl VAP protocol (if indicated): Y DVT prophylaxis: Currently in active bleed GI prophylaxis: Pantoprazole Glucose control: SSI Mobility: BR Code Status: DNR Family Communication: Had prolonged conversation with [name] at bedside regarding w Disposition: ICU  Labs   CBC: Recent Labs  Lab 07/08/2019 1310  06/30/19 1200 06/30/19 1725 07/01/19 0035 07/01/19 0522 07/01/19 1106 07/01/19 1125  WBC 15.3*   < > 20.2* 12.4* 13.9* 15.2* 17.5*  --   NEUTROABS 13.8*  --   --   --   --  13.1*  --   --   HGB 6.7*   < > 8.4* 6.1* 8.0* 7.5* 7.6* 6.8*  HCT 20.8*   < > 25.3* 18.0* 24.4* 23.2* 23.6* 20.0*  MCV 93.3   < > 92.7 92.3 93.5 93.5 93.3  --   PLT 170   < > 163 127* 133* 134* 135*  --    < > = values in this interval not displayed.    Basic Metabolic Panel: Recent Labs  Lab 07/01/2019 1354  06/27/2019 1411 07/05/2019 2232 06/30/19 0440 06/30/19 1200 07/01/19 0522 07/01/19 1125  NA 135 137 136 137  --  138 137  K 4.8 3.7 3.7 3.5  --  4.5 4.5  CL 108  --  106 107  --  109  --   CO2 20*  --  19* 19*  --  17*  --   GLUCOSE 88  --  124* 97  --  113*  --   BUN 36*  --  33* 34*  --  33*  --   CREATININE 1.63*  --  1.60* 1.69*  --  2.11*  --   CALCIUM 6.9*  --  7.0* 7.0*  --  6.6*  --   MG  --   --   --   --  1.7 2.1  --   PHOS  --   --   --   --  5.9* 6.9*  --    GFR: Estimated Creatinine Clearance: 18.8 mL/min (A) (by C-G formula based on SCr of 2.11 mg/dL (H)). Recent Labs  Lab  07/11/2019 2103  06/30/19 1725 07/01/19 0035 07/01/19 0522 07/01/19 1106  PROCALCITON  --   --   --   --   --  55.61  WBC  --    < > 12.4* 13.9* 15.2* 17.5*  LATICACIDVEN 2.6*  --   --   --   --   --    < > = values in this interval not displayed.    Liver Function Tests: Recent Labs  Lab 07/22/2019 1354 07/16/2019 2232 06/30/19 0440 07/01/19 0522  AST 661* 524* 600* 385*  ALT 151* 119* 133* 83*  ALKPHOS 201* 157* 183* 217*  BILITOT 2.3* 2.8* 3.1* 2.5*  PROT 3.7* 3.6* 3.7* <3.0*  ALBUMIN 1.7* 1.6* 1.6* 1.0*   No results for input(s): LIPASE, AMYLASE in the last 168 hours. No results for input(s): AMMONIA in the last 168 hours.  ABG    Component Value Date/Time   PHART 7.031 (LL) 07/01/2019 1125   PCO2ART 62.5 (H) 07/01/2019 1125   PO2ART 62.0 (L) 07/01/2019 1125   HCO3 16.7 (L) 07/01/2019 1125   TCO2 19 (L) 07/01/2019 1125   ACIDBASEDEF 13.0 (H) 07/01/2019 1125   O2SAT 79.0 07/01/2019 1125     Coagulation Profile: Recent Labs  Lab 07/02/2019 2232 06/30/19 0440 06/30/19 1644 06/30/19 2323 07/01/19 0522  INR 2.2* 2.4* 2.3* 2.4* 2.5*    Cardiac Enzymes: No results for input(s): CKTOTAL, CKMB, CKMBINDEX, TROPONINI in the last 168 hours.  HbA1C: Hgb A1c MFr Bld  Date/Time Value Ref Range Status  07/11/2019 01:55 PM 6.4 (H) 4.8 - 5.6 % Final    Comment:    (NOTE) Pre  diabetes:          5.7%-6.4% Diabetes:              >6.4% Glycemic control for   <7.0% adults with diabetes   07/09/2018 03:04 PM 9.9 (H) 4.8 - 5.6 % Final    Comment:    (NOTE)         Prediabetes: 5.7 - 6.4         Diabetes: >6.4         Glycemic control for adults with diabetes: <7.0     CBG: Recent Labs  Lab 06/30/19 2033 07/01/19 0056 07/01/19 0501 07/01/19 0803 07/01/19 1151  GLUCAP 83 65* 104* 102* 89    Review of Systems:   Unable to assess due to sedation  Past Medical History  She,  has a past medical history of AAA (abdominal aortic aneurysm) ([place]) (02/26/2012), Arthritis, 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, unspecified extremity ([place]) (05/28/2017), Blood transfusion (2011), Cancer [facility]), Claudication of both lower extremities ([place]) (05/28/2017), COPD (chronic obstructive pulmonary disease) ([place]), Depression, Diabetes mellitus, Diabetes mellitus with peripheral vascular disease ([place]) (05/28/2017), GERD (gastroesophageal reflux disease), Hip pain, Hypertension, Hypothyroidism, Iliac artery occlusion ([place]) (02/26/2012), Myocardial infarction [facility]), Peripheral vascular disease ([place]), PVD (peripheral vascular disease) with claudication ([place]) (02/26/2012), Sleep apnea, and Type II or unspecified type diabetes mellitus with peripheral circulatory disorders, uncontrolled(250.72) (06/15/2014).   Surgical History    Past Surgical History:  Procedure Laterality Date  . ABDOMINAL ANGIOGRAM N/A 01/31/2012   Procedure: ABDOMINAL ANGIOGRAM;  Surgeon: Conrad Lampasas, MD;  Location: [facility] CATH LAB;  Service: Cardiovascular;  Laterality: N/A;  . ABDOMINAL AORTOGRAM N/A 07/16/2017   Procedure: ABDOMINAL AORTOGRAM;  Surgeon: Serafina Mitchell, MD;  Location: [facility] CV LAB;  Service: Cardiovascular;  Laterality: N/A;  . ABDOMINAL AORTOGRAM W/LOWER EXTREMITY N/A 06/24/2018  Procedure: ABDOMINAL AORTOGRAM W/LOWER EXTREMITY;  Surgeon: Serafina Mitchell, MD;  Location: [place] CV LAB;  Service: Cardiovascular;  Laterality: N/A;  . APPENDECTOMY    . BREAST SURGERY  2009   lumpectomy Rt breast  . CHOLECYSTECTOMY    . FEET SURGERY    . FEMORAL-POPLITEAL BYPASS GRAFT Right 07/09/2018   Procedure: RIGHT FEMORAL ARTERY TO BELOW THE KNEE POPLITEAL ARTERY BYPASS GRAFT USING SAPHENOUS VEIN;  Surgeon: Serafina Mitchell, MD;  Location: [facility];  Service: Vascular;  Laterality: Right;  . GROIN DISSECTION Right 07/09/2018   Procedure: REDO RIGHT COMMON FEMORAL EXPLORATION;  Surgeon: Serafina Mitchell, MD;  Location: [facility] OR;  Service: Vascular;  Laterality: Right;  . LOWER EXTREMITY ANGIOGRAPHY Bilateral 07/16/2017   Procedure: Lower Extremity Angiography;  Surgeon: Serafina Mitchell, MD;  Location: [place] CV LAB;  Service: Cardiovascular;  Laterality: Bilateral;  . PR VEIN BYPASS GRAFT,AORTO-FEM-POP    . TUBAL LIGATION    . VEIN HARVEST Right 07/09/2018   Procedure: RIGHT LEG SAPHENOUS VEIN HARVEST;  Surgeon: Serafina Mitchell, MD;  [facility];  Service: Vascular;  Laterality: Right;     Social History   reports that she has been smoking cigarettes. She has a 32.50 pack-year smoking history. She has never used smokeless tobacco. She reports previous alcohol use. She reports that she does not use drugs.   Family History   Her family history includes Cancer in her father and sister; Heart disease in her mother. There is no history of Anesthesia problems.   Allergies Allergies  Allergen Reactions  . Penicillins Swelling and Rash    Has patient had a PCN reaction causing immediate rash, facial/tongue/throat swelling, SOB or lightheadedness with hypotension: No Has patient had a PCN reaction causing severe rash involving mucus membranes or skin necrosis: No Has patient had a PCN reaction that required hospitalization: No Has patient had a PCN reaction occurring within the last 10 years:No If all of the above answers are "NO", then may proceed with Cephalosporin use.       Home Medications  Prior to Admission medications   Medication Sig Start Date End Date Taking? Authorizing Provider  ACCU-CHEK AVIVA PLUS test strip  02/17/19   [provider]  Accu-Chek FastClix Lancets MISC  02/17/19   [provider]  alendronate (FOSAMAX) 70 MG tablet Take 70 mg by mouth once a week. Take with a full glass of water on an empty stomach.  Takes on Friday    [provider]  amLODipine-benazepril (LOTREL) 5-40 MG capsule Take 1 capsule by mouth at bedtime. 05/15/17   [provider]  anastrozole (ARIMIDEX) 1 MG tablet  02/17/19   [provider]  aspirin 325 MG tablet Take 325 mg by mouth daily.    [provider]  atenolol (TENORMIN) 100 MG tablet Take 100 mg by mouth daily.    [provider]  atorvastatin (LIPITOR) 40 MG tablet Take 40 mg by mouth at bedtime. 05/15/17   [provider]  gabapentin (NEURONTIN) 300 MG capsule Take 300 mg by mouth 2 (two) times daily.    [provider]  hydrALAZINE (APRESOLINE) 25 MG tablet Take 25 mg by mouth 3 (three) times daily.    [provider]  Insulin Glargine (LANTUS SOLOSTAR) 100 UNIT/ML Solostar Pen Inject 20 Units into the skin daily at 10 pm. Patient taking differently: Inject 10 Units into the skin daily at 10 pm.  07/16/18   Leontine Locket  J, PA-C  lansoprazole (PREVACID) 30 MG capsule Take 30 mg by mouth 2 (two) times daily. 05/15/17   [provider]  levothyroxine (SYNTHROID, LEVOTHROID) 137 MCG tablet Take 137 mcg by mouth daily before breakfast.    [provider]  losartan (COZAAR) 100 MG tablet Take 100 mg by mouth daily with breakfast. 06/20/17   [provider]  nicotine (NICODERM CQ - DOSED IN MG/24 HOURS) 14 mg/24hr patch Place 14 mg onto the skin daily.    [provider]  PARoxetine (PAXIL) 40 MG tablet Take 40 mg by mouth every morning.    [provider]  potassium chloride SA  (K-DUR,KLOR-CON) 20 MEQ tablet Take 20 mEq by mouth daily.    [provider]  rOPINIRole (REQUIP) 4 MG tablet Take 4 mg by mouth at bedtime.    [provider]  torsemide (DEMADEX) 20 MG tablet Take 40 mg by mouth daily.    [provider]  Nicholes Calamity 31G X 5 MM [place]  02/17/19   [provider]     Critical care time:     Attending attestation to follow  Gilberto Better, MD PGY2, [place] [facility] Pager: [phone]

## 2019-07-01 NOTE — Consult Note (Signed)
   [facility] Inpatient Consult   07/01/2019  Lithzy Allcorn Rosencrans 06-Apr-1941 [id number]    [facility] Care Management referral request was received from insurance plan and a notification received from [place]. Patient is currently in [facility] pending status for services/followup.  Chart review reveals from Resident attested notes of 9/072020:  Ms. Chap is a 78 y/o woman transferred from [place] for coaulopathy and acute blood loss anemia which began on 9/5. She was admitted on 8/28 for hip fx s/p TAH on 8/29. She had been started on apixaban for a suspected PE started 9/3. Her bleeding began as left-sided epistaxis and bleeding from all of her IV access sites and incision. She has no previous history of bleeding problems, including following dental procedures or at childbirth. She required a blood transfusion several years ago per her son, but was not associated with bleeding. She had an elevated INR, dropping H&H, and normal fibrinogen this morning at [place]. Prior to transfer she received TXA, FFP, & cryoprecipitate and was intubated for airway protection.  Plan: Continue to follow progress and disposition with inpatient [facility] team. Patient is in Medical ICU.  Primary Care Provider:  Nicoletta Dress, MD  For questions, please contact:  Natividad Brood, RN BSN [facility] Liaison  [phone] business mobile phone Toll free office [phone]  Fax number: [phone] [email or web address]

## 2019-07-01 NOTE — Progress Notes (Signed)
1 Unit of PRBCs completed and hemoglobin rechecked with result of 8. Per protocol if hemoglobin is above 7 second unit is not to be given due to patients blood type being O+. Will continue to closely monitor.

## 2019-07-01 NOTE — Progress Notes (Signed)
78 year old diabetic transferred from [place] on 9/7 for coagulopathy and acute respiratory failure.   She was admitted 8/28 for left hip fracture and underwent hemiarthroplasty on 8/29.  She was started on apixaban for suspected PE based on high probability VQ scan and new hypoxia on 9/3.  Apixaban was stopped 9/5 due to epistaxis, on transfer she had bleeding from IV sites, epistaxis and lower GI bleed with dark blood being aspirated from OG tube.  She was intubated 9/7  for airway protection and hypoxia.  9/8 She was transfused 1 unit PRBC She remains critically ill, intubated, on low-dose Levophed, anuric, unresponsive on low-dose fentanyl drip, severe ecchymosis both arms, bilateral scattered rhonchi soft and nontender abdomen, S1-S2 tacky, 1+ edema  Chest x-ray 9/9 personally reviewed which shows extensive bilateral airspace disease, minimally improved. Labs show increasing creatinine to 2.1, albumin of 1.0, low ionized calcium, stable leukocytosis, worsening anemia with hemoglobin that had improved to 7.5 and now drifting down again to 6.8, stable thrombocytopenia. ABG shows severe metabolic plus respiratory acidosis  Impression/plan  Acute blood loss anemia and coagulopathy-attributed to exaggerated effect of Eliquis in the presence of renal insufficiency and ineffective platelets due to her aspirin, hemoglobin is drifting back down, will transfuse 2 more units PRBC and 4 units of FFP to correct INR Source of bleeding is unclear -nasal bleeding seems to have stopped and no further apparent GI bleeding. Correct hypothermia aggressively with warming blanket  AKI with severe acidosis-respiratory rate was increased, will bicarbonate drip started, pH improved to 7.1, will recheck ABG every 4 hours x2. She is not a good candidate for dialysis, will involve renal if unable to improve acidosis.  Hemorrhagic versus septic shock-continue broad-spectrum empiric antibiotics, coming off  Levophed.  Acute hypoxic respiratory failure-keep PEEP at 8, decrease FiO2 as tolerated Elevated LFTs seem to be due to shock liver and is improving.  Poor prognosis given to family, after further discussion today no CPR no cardioversion was issued.  They do want to pursue full medical  The patient is critically ill with multiple organ systems failure and requires high complexity decision making for assessment and support, frequent evaluation and titration of therapies, application of advanced monitoring technologies and extensive interpretation of multiple databases. Critical Care Time devoted to patient care services described in this note independent of APP/resident  time is 45 minutes.   Leanna Sato Elsworth Soho MD

## 2019-07-01 NOTE — Progress Notes (Signed)
Notified E-Link about arterial blood gas results. Increased respiratory rate to 30 and tidal volume to 43ml/kg. Plat Pressures 20-24 cm. Will continue to monitor patients status.

## 2019-07-01 NOTE — Progress Notes (Addendum)
5:44 pm: Repeat ABG with PH 7.132, Hco3 18.5, pCO2 55.  -Giving 77meq bicarbonate amp. -F/u 9pm ABG   6:23 PM: Post transfusion Hb 6.8.  -Giving another unit of pRBC. -f/u 2h post transfusion H &H

## 2019-07-01 NOTE — Progress Notes (Addendum)
HEMATOLOGY-ONCOLOGY PROGRESS NOTE  SUBJECTIVE: Remains on ventilator.  Sedated.  Son is at bedside.  No urine output today.  Has been having bleeding from her mouth, nose, and rectum.  Receiving 4 units of FFP and 2 units of packed red blood cells.  Received 10 mg of IV vitamin K earlier today.  REVIEW OF SYSTEMS:   Unable to obtain review of systems secondary to patient condition.  I have reviewed the past medical history, past surgical history, social history and family history with the patient and they are unchanged from previous note.   PHYSICAL EXAMINATION:  Vitals:   07/01/19 1424 07/01/19 1427  BP:    Pulse: 71 72  Resp: (!) 24 (!) 24  Temp:    SpO2: 98% 98%   Filed Weights   07/20/2019 1223 06/30/19 0445 07/01/19 0500  Weight: 132 lb 11.5 oz (60.2 kg) 136 lb 3.9 oz (61.8 kg) 138 lb 3.7 oz (62.7 kg)    Intake/Output from previous day: 09/08 0701 - 09/09 0700 In: 3274.9 [I.V.:2401.3; Blood:315; NG/GT:360; IV Piggyback:198.6] Out: 210 [Urine:60]  General: Frail, critically ill-appearing.     ENT: ET tube in place.  Dried blood noted around oropharynx.  Fresh blood in right nares.  Mucous membranes are dry.    Respiratory: Diffuse crackles bilaterally.  Cardiovascular:  Regular rate and rhythm, S1/S2, without murmur, rub or gallop. There was 1+ pedal edema.   GI:  abdomen was soft, flat, nontender, nondistended, without organomegaly.  Neuro: Sedated Skin: Multiple areas of bruising noted at IV sites and puncture sites.  LABORATORY DATA:  I have reviewed the data as listed CMP Latest Ref Rng & Units 07/01/2019 07/01/2019 07/01/2019  Glucose 70 - 99 mg/dL - - 113(H)  BUN 8 - 23 mg/dL - - 33(H)  Creatinine 0.44 - 1.00 mg/dL - - 2.11(H)  Sodium 135 - 145 mmol/L 138 137 138  Potassium 3.5 - 5.1 mmol/L 4.3 4.5 4.5  Chloride 98 - 111 mmol/L - - 109  CO2 22 - 32 mmol/L - - 17(L)  Calcium 8.9 - 10.3 mg/dL - - 6.6(L)  Total Protein 6.5 - 8.1 g/dL - - <3.0(L)  Total Bilirubin 0.3 -  1.2 mg/dL - - 2.5(H)  Alkaline Phos 38 - 126 U/L - - 217(H)  AST 15 - 41 U/L - - 385(H)  ALT 0 - 44 U/L - - 83(H)    Lab Results  Component Value Date   WBC 17.5 (H) 07/01/2019   HGB 5.4 (LL) 07/01/2019   HCT 16.0 (L) 07/01/2019   MCV 93.3 07/01/2019   PLT 135 (L) 07/01/2019   NEUTROABS 13.1 (H) 07/01/2019    Dg Chest Port 1 View  Result Date: 07/01/2019 CLINICAL DATA:  Intubated patient with acute respiratory failure and acute kidney injury. EXAM: PORTABLE CHEST 1 VIEW COMPARISON:  Single-view of the chest 07/21/2019. PA and lateral chest 06/24/2019. FINDINGS: Support tubes and lines are unchanged since yesterday's examination. Extensive bilateral airspace disease persists but appears mildly improved. There are bilateral pleural effusions. No pneumothorax. Heart size is normal. IMPRESSION: Support apparatus projects in good position and is unchanged. Mild improvement in diffuse bilateral airspace disease. Pleural effusions again seen. Electronically Signed   By: Inge Rise M.D.   On: 07/01/2019 11:00   Dg Chest Port 1 View  Result Date: 07/08/2019 CLINICAL DATA:  78 year old female with central line placement. EXAM: PORTABLE CHEST 1 VIEW COMPARISON:  Chest radiograph dated 07/07/2019 and CT dated 01/06/2019 FINDINGS: Endotracheal and enteric tube  in similar position. There has been interval placement of a left IJ central venous line with tip close to the cavoatrial junction. No pneumothorax. No significant interval change in the appearance of the lungs since the earlier radiograph. Stable cardiac silhouette. Atherosclerotic calcification of the aorta. No acute osseous pathology. IMPRESSION: Interval placement of a left IJ central venous line with tip close to the cavoatrial junction. No pneumothorax. Electronically Signed   By: Anner Crete M.D.   On: 06/26/2019 17:10   Dg Chest Port 1 View  Result Date: 06/30/2019 CLINICAL DATA:  Status post intubation. EXAM: PORTABLE CHEST 1 VIEW  COMPARISON:  07/17/2019 FINDINGS: The endotracheal tube tip is stable above the carina. Enteric tube tip is below the GE junction. Normal heart size. Bilateral pleural effusions are identified, increased from previous exam. Diffuse bilateral interstitial and airspace opacities are again noted and appear unchanged. IMPRESSION: 1. Satisfactory position of ET tube and enteric tube. 2. No change in aeration of both lungs. 3. Increase in bilateral pleural effusions. Electronically Signed   By: Kerby Moors M.D.   On: 06/30/2019 14:06    ASSESSMENT AND PLAN: 1.  Acute hemorrhagic shock secondary to aspirin and exaggerated effect of Eliquis in the presence of renal insufficiency 2.  Acute liver failure 3.  AKI 4.  Malnutrition  Ms. Carino  continues to have intermittent bleeding.  This is likely multifactorial and related to recent aspirin use and exaggerated affect of Eliquis in the setting of renal insufficiency.  She is also developed acute liver failure and has a poor nutritional status.  Recommendations 1.  Monitor daily PT/INR, PTT, and fibrinogen 2.  Continue vitamin K 10 mg either IV or via feeding tube daily for at least 2 more days 3.  Continue supportive care with packed red blood cell transfusion and FFP.  If fibrinogen is less than 150, administer cryoprecipitate. 4.  Continue to hold anticoagulation for the next few days.  If PCCM feels high likelihood of PE, recommend restarting anticoagulation with IV heparin. 5.  Consider ENT evaluation for persistent mouth/nose bleeding    LOS: 2 days   Mikey Bussing, DNP, AGPCNP-BC, AOCNP 07/01/19 I saw Ms. Gochnour at approximately 7 AM.  There was no bleeding at line sites or the nose.  There was a dry gauze dressing in the mouth. She now has recurrent bleeding with progressive anemia and persistent coagulopathy.  The persistent coagulopathy is likely secondary to liver disease, malnutrition, and polypharmacy.  I agree with the plan to  administer FFP.  She is not a candidate for anticoagulation therapy with the persistent coagulopathy and ongoing bleeding.  Renal failure with platelet dysfunction may be contributing to the bleeding.  Ms. Oliverson appears to have a very poor prognosis with the severe multiorgan failure.

## 2019-07-02 ENCOUNTER — Other Ambulatory Visit: Payer: Self-pay

## 2019-07-02 ENCOUNTER — Inpatient Hospital Stay (HOSPITAL_COMMUNITY): Payer: Medicare HMO

## 2019-07-02 DIAGNOSIS — D696 Thrombocytopenia, unspecified: Secondary | ICD-10-CM

## 2019-07-02 DIAGNOSIS — E43 Unspecified severe protein-calorie malnutrition: Secondary | ICD-10-CM

## 2019-07-02 DIAGNOSIS — N179 Acute kidney failure, unspecified: Secondary | ICD-10-CM

## 2019-07-02 LAB — BPAM FFP
Blood Product Expiration Date: 202009132359
Blood Product Expiration Date: 202009132359
Blood Product Expiration Date: 202009132359
Blood Product Expiration Date: 202009132359
ISSUE DATE / TIME: 202009091122
ISSUE DATE / TIME: 202009091323
ISSUE DATE / TIME: 202009091323
ISSUE DATE / TIME: 202009091531
Unit Type and Rh: 6200
Unit Type and Rh: 6200
Unit Type and Rh: 6200
Unit Type and Rh: 6200

## 2019-07-02 LAB — TYPE AND SCREEN
ABO/RH(D): O POS
Antibody Screen: NEGATIVE
Unit division: 0
Unit division: 0
Unit division: 0
Unit division: 0
Unit division: 0

## 2019-07-02 LAB — CBC
HCT: 22.8 % — ABNORMAL LOW (ref 36.0–46.0)
HCT: 22.3 % — ABNORMAL LOW (ref 36.0–46.0)
HCT: 21.7 % — ABNORMAL LOW (ref 36.0–46.0)
HCT: 24.6 % — ABNORMAL LOW (ref 36.0–46.0)
Hemoglobin: 7.8 g/dL — ABNORMAL LOW (ref 12.0–15.0)
Hemoglobin: 7.7 g/dL — ABNORMAL LOW (ref 12.0–15.0)
Hemoglobin: 7.5 g/dL — ABNORMAL LOW (ref 12.0–15.0)
Hemoglobin: 8.5 g/dL — ABNORMAL LOW (ref 12.0–15.0)
MCH: 30.8 pg (ref 26.0–34.0)
MCH: 30.4 pg (ref 26.0–34.0)
MCH: 30.5 pg (ref 26.0–34.0)
MCH: 30.6 pg (ref 26.0–34.0)
MCHC: 34.2 g/dL (ref 30.0–36.0)
MCHC: 34.5 g/dL (ref 30.0–36.0)
MCHC: 34.6 g/dL (ref 30.0–36.0)
MCHC: 34.6 g/dL (ref 30.0–36.0)
MCV: 90.1 fL (ref 80.0–100.0)
MCV: 88.1 fL (ref 80.0–100.0)
MCV: 88.2 fL (ref 80.0–100.0)
MCV: 88.5 fL (ref 80.0–100.0)
Platelets: 66 10*3/uL — ABNORMAL LOW (ref 150–400)
Platelets: 62 10*3/uL — ABNORMAL LOW (ref 150–400)
Platelets: 55 10*3/uL — ABNORMAL LOW (ref 150–400)
Platelets: 53 10*3/uL — ABNORMAL LOW (ref 150–400)
RBC: 2.53 MIL/uL — ABNORMAL LOW (ref 3.87–5.11)
RBC: 2.53 MIL/uL — ABNORMAL LOW (ref 3.87–5.11)
RBC: 2.46 MIL/uL — ABNORMAL LOW (ref 3.87–5.11)
RBC: 2.78 MIL/uL — ABNORMAL LOW (ref 3.87–5.11)
RDW: 15.3 % (ref 11.5–15.5)
RDW: 15.3 % (ref 11.5–15.5)
RDW: 15.5 % (ref 11.5–15.5)
RDW: 15.7 % — ABNORMAL HIGH (ref 11.5–15.5)
WBC: 11.8 10*3/uL — ABNORMAL HIGH (ref 4.0–10.5)
WBC: 14.5 10*3/uL — ABNORMAL HIGH (ref 4.0–10.5)
WBC: 16.2 10*3/uL — ABNORMAL HIGH (ref 4.0–10.5)
WBC: 22 10*3/uL — ABNORMAL HIGH (ref 4.0–10.5)
nRBC: 0.5 % — ABNORMAL HIGH (ref 0.0–0.2)
nRBC: 0.3 % — ABNORMAL HIGH (ref 0.0–0.2)
nRBC: 0.3 % — ABNORMAL HIGH (ref 0.0–0.2)
nRBC: 0.3 % — ABNORMAL HIGH (ref 0.0–0.2)

## 2019-07-02 LAB — BPAM RBC
Blood Product Expiration Date: 202009142359
Blood Product Expiration Date: 202010112359
Blood Product Expiration Date: 202010122359
Blood Product Expiration Date: 202010142359
Blood Product Expiration Date: 202010142359
ISSUE DATE / TIME: 202009071752
ISSUE DATE / TIME: 202009071924
ISSUE DATE / TIME: 202009081937
ISSUE DATE / TIME: 202009091322
ISSUE DATE / TIME: 202009091838
Unit Type and Rh: 5100
Unit Type and Rh: 5100
Unit Type and Rh: 5100
Unit Type and Rh: 5100
Unit Type and Rh: 5100

## 2019-07-02 LAB — PROTIME-INR
INR: 2 — ABNORMAL HIGH (ref 0.8–1.2)
INR: 2.1 — ABNORMAL HIGH (ref 0.8–1.2)
INR: 1.9 — ABNORMAL HIGH (ref 0.8–1.2)
Prothrombin Time: 22.7 seconds — ABNORMAL HIGH (ref 11.4–15.2)
Prothrombin Time: 23.6 seconds — ABNORMAL HIGH (ref 11.4–15.2)
Prothrombin Time: 21.3 seconds — ABNORMAL HIGH (ref 11.4–15.2)

## 2019-07-02 LAB — RENAL FUNCTION PANEL
Albumin: 1.1 g/dL — ABNORMAL LOW (ref 3.5–5.0)
Anion gap: 13 (ref 5–15)
BUN: 29 mg/dL — ABNORMAL HIGH (ref 8–23)
CO2: 21 mmol/L — ABNORMAL LOW (ref 22–32)
Calcium: 6.6 mg/dL — ABNORMAL LOW (ref 8.9–10.3)
Chloride: 108 mmol/L (ref 98–111)
Creatinine, Ser: 2.41 mg/dL — ABNORMAL HIGH (ref 0.44–1.00)
GFR calc Af Amer: 21 mL/min — ABNORMAL LOW (ref >60)
GFR calc non Af Amer: 18 mL/min — ABNORMAL LOW (ref >60)
Glucose, Bld: 140 mg/dL — ABNORMAL HIGH (ref 70–99)
Phosphorus: 4.3 mg/dL (ref 2.5–4.6)
Potassium: 3.6 mmol/L (ref 3.5–5.1)
Sodium: 142 mmol/L (ref 135–145)

## 2019-07-02 LAB — POCT I-STAT 7, (LYTES, BLD GAS, ICA,H+H)
Acid-base deficit: 5 mmol/L — ABNORMAL HIGH (ref 0.0–2.0)
Acid-base deficit: 3 mmol/L — ABNORMAL HIGH (ref 0.0–2.0)
Bicarbonate: 20.6 mmol/L (ref 20.0–28.0)
Bicarbonate: 21.8 mmol/L (ref 20.0–28.0)
Calcium, Ion: 0.99 mmol/L — ABNORMAL LOW (ref 1.15–1.40)
Calcium, Ion: 0.97 mmol/L — ABNORMAL LOW (ref 1.15–1.40)
HCT: 19 % — ABNORMAL LOW (ref 36.0–46.0)
HCT: 21 % — ABNORMAL LOW (ref 36.0–46.0)
Hemoglobin: 6.5 g/dL — CL (ref 12.0–15.0)
Hemoglobin: 7.1 g/dL — ABNORMAL LOW (ref 12.0–15.0)
O2 Saturation: 98 %
O2 Saturation: 98 %
Patient temperature: 98.6
Potassium: 3.7 mmol/L (ref 3.5–5.1)
Potassium: 3.6 mmol/L (ref 3.5–5.1)
Sodium: 140 mmol/L (ref 135–145)
Sodium: 139 mmol/L (ref 135–145)
TCO2: 22 mmol/L (ref 22–32)
TCO2: 23 mmol/L (ref 22–32)
pCO2 arterial: 38.9 mmHg (ref 32.0–48.0)
pCO2 arterial: 36.3 mmHg (ref 32.0–48.0)
pH, Arterial: 7.331 — ABNORMAL LOW (ref 7.350–7.450)
pH, Arterial: 7.387 (ref 7.350–7.450)
pO2, Arterial: 114 mmHg — ABNORMAL HIGH (ref 83.0–108.0)
pO2, Arterial: 113 mmHg — ABNORMAL HIGH (ref 83.0–108.0)

## 2019-07-02 LAB — PREPARE FRESH FROZEN PLASMA
Unit division: 0
Unit division: 0
Unit division: 0
Unit division: 0

## 2019-07-02 LAB — COMPREHENSIVE METABOLIC PANEL
ALT: 58 U/L — ABNORMAL HIGH (ref 0–44)
AST: 228 U/L — ABNORMAL HIGH (ref 15–41)
Albumin: 1.2 g/dL — ABNORMAL LOW (ref 3.5–5.0)
Alkaline Phosphatase: 239 U/L — ABNORMAL HIGH (ref 38–126)
Anion gap: 16 — ABNORMAL HIGH (ref 5–15)
BUN: 30 mg/dL — ABNORMAL HIGH (ref 8–23)
CO2: 19 mmol/L — ABNORMAL LOW (ref 22–32)
Calcium: 6.6 mg/dL — ABNORMAL LOW (ref 8.9–10.3)
Chloride: 104 mmol/L (ref 98–111)
Creatinine, Ser: 2.44 mg/dL — ABNORMAL HIGH (ref 0.44–1.00)
GFR calc Af Amer: 21 mL/min — ABNORMAL LOW (ref >60)
GFR calc non Af Amer: 18 mL/min — ABNORMAL LOW (ref >60)
Glucose, Bld: 231 mg/dL — ABNORMAL HIGH (ref 70–99)
Potassium: 3.7 mmol/L (ref 3.5–5.1)
Sodium: 139 mmol/L (ref 135–145)
Total Bilirubin: 3.3 mg/dL — ABNORMAL HIGH (ref 0.3–1.2)
Total Protein: 3.1 g/dL — ABNORMAL LOW (ref 6.5–8.1)

## 2019-07-02 LAB — POCT ACTIVATED CLOTTING TIME: Activated Clotting Time: 0 seconds

## 2019-07-02 LAB — GLUCOSE, CAPILLARY
Glucose-Capillary: 222 mg/dL — ABNORMAL HIGH (ref 70–99)
Glucose-Capillary: 234 mg/dL — ABNORMAL HIGH (ref 70–99)
Glucose-Capillary: 209 mg/dL — ABNORMAL HIGH (ref 70–99)
Glucose-Capillary: 194 mg/dL — ABNORMAL HIGH (ref 70–99)
Glucose-Capillary: 137 mg/dL — ABNORMAL HIGH (ref 70–99)

## 2019-07-02 LAB — FIBRINOGEN: Fibrinogen: 414 mg/dL (ref 210–475)

## 2019-07-02 LAB — APTT: aPTT: 52 seconds — ABNORMAL HIGH (ref 24–36)

## 2019-07-02 MED ORDER — HEPARIN SODIUM (PORCINE) 1000 UNIT/ML DIALYSIS
1000.0000 [IU] | INTRAMUSCULAR | Status: DC | PRN
  Filled 2019-07-02: qty 6

## 2019-07-02 MED ORDER — PRISMASOL BGK 4/2.5 32-4-2.5 MEQ/L REPLACEMENT SOLN
Status: DC
  Administered 2019-07-02 – 2019-07-03 (×2): via INTRAVENOUS_CENTRAL
  Filled 2019-07-02 (×7): qty 5000

## 2019-07-02 MED ORDER — SODIUM CHLORIDE 0.9 % FOR CRRT
INTRAVENOUS_CENTRAL | Status: DC | PRN
  Filled 2019-07-02: qty 1000

## 2019-07-02 MED ORDER — PRISMASOL BGK 4/2.5 32-4-2.5 MEQ/L IV SOLN
INTRAVENOUS | Status: DC
  Administered 2019-07-02 – 2019-07-03 (×4): via INTRAVENOUS_CENTRAL
  Filled 2019-07-02 (×14): qty 5000

## 2019-07-02 MED ORDER — PRISMASOL BGK 4/2.5 32-4-2.5 MEQ/L REPLACEMENT SOLN
Status: DC
  Administered 2019-07-02: 16:00:00 via INTRAVENOUS_CENTRAL
  Filled 2019-07-02 (×3): qty 5000

## 2019-07-02 MED ORDER — VITAL AF 1.2 CAL PO LIQD
1000.0000 mL | ORAL | Status: DC
  Administered 2019-07-02: 15:00:00 1000 mL

## 2019-07-02 MED ORDER — LEVOTHYROXINE SODIUM 125 MCG PO TABS
125.0000 ug | ORAL_TABLET | Freq: Every day | ORAL | Status: DC
  Administered 2019-07-02 – 2019-07-03 (×2): 125 ug
  Filled 2019-07-02 (×3): qty 1

## 2019-07-02 MED ORDER — PRO-STAT SUGAR FREE PO LIQD
30.0000 mL | Freq: Two times a day (BID) | ORAL | Status: DC
  Administered 2019-07-02 – 2019-07-03 (×3): 30 mL
  Filled 2019-07-02 (×3): qty 30

## 2019-07-02 NOTE — Procedures (Signed)
I was present at this dialysis session. I have reviewed the session itself and made appropriate changes.   Filed Weights   06/30/19 0445 07/01/19 0500 07/02/19 0410  Weight: 61.8 kg 62.7 kg 69.7 kg    Recent Labs  Lab 07/01/19 0522  07/02/19 0403 07/02/19 0551  NA 138   < > 139 139  K 4.5   < > 3.7 3.6  CL 109  --  104  --   CO2 17*  --  19*  --   GLUCOSE 113*  --  231*  --   BUN 33*  --  30*  --   CREATININE 2.11*  --  2.44*  --   CALCIUM 6.6*  --  6.6*  --   PHOS 6.9*  --   --   --    < > = values in this interval not displayed.    Recent Labs  Lab 07/13/2019 1310  07/01/19 0522  07/02/19 0400 07/02/19 0551 07/02/19 1023 07/02/19 1600  WBC 15.3*   < > 15.2*   < > 11.8*  --  14.5* 16.2*  NEUTROABS 13.8*  --  13.1*  --   --   --   --   --   HGB 6.7*   < > 7.5*   < > 7.8* 7.1* 7.7* 7.5*  HCT 20.8*   < > 23.2*   < > 22.8* 21.0* 22.3* 21.7*  MCV 93.3   < > 93.5   < > 90.1  --  88.1 88.2  PLT 170   < > 134*   < > 66*  --  62* 55*   < > = values in this interval not displayed.    Scheduled Meds: . sodium chloride   Intravenous Once  . chlorhexidine gluconate (MEDLINE KIT)  15 mL Mouth Rinse BID  . Chlorhexidine Gluconate Cloth  6 each Topical Daily  . feeding supplement (PRO-STAT SUGAR FREE 64)  30 mL Per Tube BID  . feeding supplement (VITAL AF 1.2 CAL)  1,000 mL Per Tube Q24H  . fentaNYL (SUBLIMAZE) injection  25 mcg Intravenous Once  . insulin aspart  0-9 Units Subcutaneous Q4H  . levothyroxine  125 mcg Per Tube Q0600  . mouth rinse  15 mL Mouth Rinse 10 times per day  . pantoprazole (PROTONIX) IV  40 mg Intravenous Q12H   Continuous Infusions: .  prismasol BGK 4/2.5 500 mL/hr at 07/02/19 1531  .  prismasol BGK 4/2.5 200 mL/hr at 07/02/19 1532  . fentaNYL infusion INTRAVENOUS Stopped (07/01/19 1338)  . norepinephrine (LEVOPHED) Adult infusion 2 mcg/min (07/02/19 1600)  . prismasol BGK 4/2.5 1,000 mL/hr at 07/02/19 1530   PRN Meds:.fentaNYL, heparin, sodium  chloride   Pearson Grippe  MD 07/02/2019, 4:58 PM

## 2019-07-02 NOTE — Progress Notes (Addendum)
NAME:  [name], MRN:  [id number], DOB:  05-19-1941, LOS: 3 ADMISSION DATE:  06/23/2019, CONSULTATION DATE:  07/11/2019 REFERRING MD:  Oval Linsey, CHIEF COMPLAINT:  Bleeding   Brief History   Mrs.Gaubert is a 78 yo F w/ PMH of Hypothyroidism, PAD, HFpEF, HTN, DM and admit for fall at [facility] with hip fracture. Hospital course complicated by PE and UTI now transferred to [facility] for intubation and pressor support after developing diffuse bleeding on hospital day 9. Now with prolonged bleeding with elevated INR requiring multiple transfusions.  Significant Hospital Events   8/28 Admit to [facility] 8/29 L Hemiarthroplasty 9/2 UTI, started on abx 9/3 V/Q scan diagnosed LUL defect started on eliquis for PE 9/5 Bleeding events started, eliquis d/c 9/7 Worsening bleed w/ hypotension requiring pressors 9/7 Intubated at [facility] 9/7 Transfer to [facility]  Consults:  Heme/Onc (telephone)  Procedures:  8/29 L hemiarthroplasty 9/7 Intubation @ [facility] 9/7 R IJ placement>> 9/8 R femoral arterial line placement >>  Significant Diagnostic Tests:  9/3 @ [facility]: LUL ventilation/perfusion defect 9/2 Chest X-ray personally reviewed: poor inspiratory efforts, bilateral infiltrates, vascular congestion. 9/7 Chest X-ray personally reviewed: No pleural effusion, Bilateral diffuse opacities R > L.  9/9 Chest X-ray personally review: Improving bilateral diffuse opacities. Now  W/ R pleural effusion 9/10 Chest X-ray personally reviewed: worsening bilateral pleural effusions  Micro Data:  9/7 BCx pending 9/7 UCx pending 9/7 RespCx pending 9/8 MRSA PCR negative  Antimicrobials:  9/2 Levaquin >>> 9/3 Aztreonam > 9/7 9/2 Diflucan > 9/4 9/7 Vanc >>9/8  Interim history/subjective:  [name] was examined and evaluated at bedside this am. She is off sedation but is unresponsive to verbal / painful stimuli.  Objective   Blood pressure (!) 109/33, pulse 66, temperature 98 F (36.7 C), resp. rate (!)  30, height 5\' 2"  (1.575 m), weight 69.7 kg, SpO2 100 %.    Vent Mode: PRVC FiO2 (%):  [50 %-80 %] 70 % Set Rate:  [14 bmp-30 bmp] 30 bmp Vt Set:  [300 mL-400 mL] 400 mL PEEP:  [8 cmH20] 8 cmH20 Plateau Pressure:  [17 cmH20-25 cmH20] 25 cmH20   Intake/Output Summary (Last 24 hours) at 07/02/2019 [id number] Last data filed at 07/02/2019 0700 Gross per 24 hour  Intake 4260.66 ml  Output 0 ml  Net 4260.66 ml   Filed Weights   06/30/19 0445 07/01/19 0500 07/02/19 0410  Weight: 61.8 kg 62.7 kg 69.7 kg   Examination: Gen: ill-appearing female HEENT: ET tube in place, continued oozing blood secretion from oropharynx CV: Regular rate, regular rhythm, S1, S2 normal Pulm: Bibasilar rales, no wheezing Abd: Soft, non-distended Extm: ROM intact, peripheral pulses intact, Anasarca with significant 3+ pitting edema Skin: Pallor, decreased oozing bleed from [facility] Problem list     Assessment & Plan:   Goals of Care discussion Ongoing discussion w/ family regarding poor prognosis. Made Limited code w/ no compression or shock yesterday. - Will c/w goals of care discussion, especially regarding CRRT  Diffuse bleeding 2/2 DOAC use + antiplatelet therapy + liver dysfux INR improving 2.4->2.5->2.0 Platelets 149->135->66. DIC ruled out. Unclear etiology of coagulopathy. Vitamin K given per heme/onc. Continued bleed with hgb down to 6.5 requiring 2 units of blood transfusions. Hgb 7.8 this am. - Appreciate heme/onc recs: Likely due to prolonged anti-platelet + anticoagulant, supportive care  - Trend CBC q6 hr - Transfuse if <7 - Wound care consult  Hypovolemic Shock 2/2 active bleed Current systolic Bp [id number]. Able to titrate down  to 28mcg this am. Anasarca due to tissue edema - C/w levophed to keep MAP >65 - Monitor vitals - I/Os  Anasarca 2/2 hypoalbuminema Pitting edema on exam up to abdomen. Albumin 1.0->1.2 after 4 units of FFP yesterday - C/w monitor - C/w tube feeds  Acute  Kidney Injury 2/2 ATN Worsening renal fx Creatinine 1.6->1.69->2.11->2.44. GFR down to 22%. Renal US w/o sign of obstruction. Continues to have 0 urinary output. - Will need to consult Nephro for possible CRRT but will discuss w/ family first - Will hold fluid resuscitation at the moment as worsening AKI and anasarca - Trend renal function - Avoid nephrotoxic meds  Hypothermia, Leukocytosis 2/2 Sepsis vs Severe anemia Continues to be hypothermic but leukocytosis improving 20.2->13.9->11.8. Unclear source of infection. Cultures negative so far. Procalcitonin 55.61 after 8 days of abx.  - C/w bair hugger - C/w levaquin (day 9) - F/u blood/urine cultures  Hypoxic respiratory failure requiring intubation Acute Respiratory Acidosis Pulmonary Embolism Blood gas currently improved to pH 7.387, pCO2 36.3, pO2 113, Bicarb 19 w/ increased RR. - Trend ABGs - C/w intubation with PRVC keep O2 sat >90 , plateau <30 - VAP protocol  - SBT as appropriate - Sedation w/ fentanyl, goal RASS-1 - Hold diuresis in setting of AKI - Hold anticoagulation in setting of bleed  T2DM - SSI, glucose checks  Hypothyroidism TSH 1.031. Current dose appropriate - C/w home dose as IV: IV levothyroxine 62.67mcg  Depression/Anxiety Paroxetine 40mg  at home - Holding home psych meds for NPO  HTN - Holding home antihypertensives in setting of NPO  Best practice:  Diet: NPO Pain/Anxiety/Delirium protocol (if indicated): Fentanyl VAP protocol (if indicated): Y DVT prophylaxis: Currently in active bleed GI prophylaxis: Pantoprazole Glucose control: SSI Mobility: BR Code Status: DNR Family Communication: Will update at bedside when family arrives Disposition: ICU  Labs   CBC: Recent Labs  Lab 07/14/2019 1310  07/01/19 0522 07/01/19 1106  07/01/19 1733  07/01/19 1917 07/01/19 2204 07/02/19 0121 07/02/19 0400 07/02/19 0551  WBC 15.3*   < > 15.2* 17.5*  --  11.4*  --   --  10.4  --  11.8*  --    NEUTROABS 13.8*  --  13.1*  --   --   --   --   --   --   --   --   --   HGB 6.7*   < > 7.5* 7.6*   < > 6.8*   < > 6.5* 8.4* 6.5* 7.8* 7.1*  HCT 20.8*   < > 23.2* 23.6*   < > 21.0*   < > 19.0* 24.4* 19.0* 22.8* 21.0*  MCV 93.3   < > 93.5 93.3  --  91.7  --   --  90.0  --  90.1  --   PLT 170   < > 134* 135*  --  84*  --   --  76*  --  66*  --    < > = values in this interval not displayed.    Basic Metabolic Panel: Recent Labs  Lab 06/24/2019 1354  06/26/2019 2232 06/30/19 0440 06/30/19 1200 07/01/19 0522  07/01/19 1734 07/01/19 1917 07/02/19 0121 07/02/19 0403 07/02/19 0551  NA 135   < > 136 137  --  138   < > 139 140 140 139 139  K 4.8   < > 3.7 3.5  --  4.5   < > 4.0 4.1 3.7 3.7 3.6  CL 108  --  106 107  --  109  --   --   --   --  104  --   CO2 20*  --  19* 19*  --  17*  --   --   --   --  19*  --   GLUCOSE 88  --  124* 97  --  113*  --   --   --   --  231*  --   BUN 36*  --  33* 34*  --  33*  --   --   --   --  30*  --   CREATININE 1.63*  --  1.60* 1.69*  --  2.11*  --   --   --   --  2.44*  --   CALCIUM 6.9*  --  7.0* 7.0*  --  6.6*  --   --   --   --  6.6*  --   MG  --   --   --   --  1.7 2.1  --   --   --   --   --   --   PHOS  --   --   --   --  5.9* 6.9*  --   --   --   --   --   --    < > = values in this interval not displayed.   GFR: Estimated Creatinine Clearance: 17.1 mL/min (A) (by C-G formula based on SCr of 2.44 mg/dL (H)). Recent Labs  Lab 07/16/2019 2103  07/01/19 1106 07/01/19 1733 07/01/19 2204 07/02/19 0400  PROCALCITON  --   --  55.61  --   --   --   WBC  --    < > 17.5* 11.4* 10.4 11.8*  LATICACIDVEN 2.6*  --   --   --   --   --    < > = values in this interval not displayed.    Liver Function Tests: Recent Labs  Lab 06/26/2019 1354 07/13/2019 2232 06/30/19 0440 07/01/19 0522 07/02/19 0403  AST 661* 524* 600* 385* 228*  ALT 151* 119* 133* 83* 58*  ALKPHOS 201* 157* 183* 217* 239*  BILITOT 2.3* 2.8* 3.1* 2.5* 3.3*  PROT 3.7* 3.6* 3.7* <3.0*  3.1*  ALBUMIN 1.7* 1.6* 1.6* 1.0* 1.2*   No results for input(s): LIPASE, AMYLASE in the last 168 hours. No results for input(s): AMMONIA in the last 168 hours.  ABG    Component Value Date/Time   PHART 7.387 07/02/2019 0551   PCO2ART 36.3 07/02/2019 0551   PO2ART 113.0 (H) 07/02/2019 0551   HCO3 21.8 07/02/2019 0551   TCO2 23 07/02/2019 0551   ACIDBASEDEF 3.0 (H) 07/02/2019 0551   O2SAT 98.0 07/02/2019 0551     Coagulation Profile: Recent Labs  Lab 06/30/19 1644 06/30/19 2323 07/01/19 0522 07/01/19 1733 07/02/19 0403  INR 2.3* 2.4* 2.5* 2.0* 2.0*    Cardiac Enzymes: No results for input(s): CKTOTAL, CKMB, CKMBINDEX, TROPONINI in the last 168 hours.  HbA1C: Hgb A1c MFr Bld  Date/Time Value Ref Range Status  07/20/2019 01:55 PM 6.4 (H) 4.8 - 5.6 % Final    Comment:    (NOTE) Pre diabetes:          5.7%-6.4% Diabetes:              >6.4% Glycemic control for   <7.0% adults with diabetes   07/09/2018 03:04 PM 9.9 (H) 4.8 - 5.6 % Final  Comment:    (NOTE)         Prediabetes: 5.7 - 6.4         Diabetes: >6.4         Glycemic control for adults with diabetes: <7.0     CBG: Recent Labs  Lab 07/01/19 1151 07/01/19 1548 07/01/19 2035 07/02/19 0101 07/02/19 0452  GLUCAP 89 165* 179* 222* 234*    Review of Systems:   Unable to assess due to sedation  Past Medical History  She,  has a past medical history of AAA (abdominal aortic aneurysm) ([place]) (02/26/2012), Arthritis, 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, unspecified extremity ([place]) (05/28/2017), Blood transfusion (2011), Cancer [facility]), Claudication of both lower extremities ([place]) (05/28/2017), COPD (chronic obstructive pulmonary disease) ([place]), Depression, Diabetes mellitus, Diabetes mellitus with peripheral vascular disease ([place]) (05/28/2017), GERD (gastroesophageal reflux disease), Hip pain, Hypertension, Hypothyroidism, Iliac artery occlusion ([place]) (02/26/2012), Myocardial infarction  [facility]), Peripheral vascular disease ([place]), PVD (peripheral vascular disease) with claudication ([place]) (02/26/2012), Sleep apnea, and Type II or unspecified type diabetes mellitus with peripheral circulatory disorders, uncontrolled(250.72) (06/15/2014).   Surgical History    Past Surgical History:  Procedure Laterality Date  . ABDOMINAL ANGIOGRAM N/A 01/31/2012   Procedure: ABDOMINAL ANGIOGRAM;  Surgeon: Conrad Half Moon, MD;  [facility] C[facility] LAB;  Service: Cardiovascular;  Laterality: N/A;  . ABDOMINAL AORTOGRAM N/A 07/16/2017   Procedure: ABDOMINAL AORTOGRAM;  Surgeon: Serafina Mitchell, MD;  Location: [facility] CV LAB;  Service: Cardiovascular;  Laterality: N/A;  . ABDOMINAL AORTOGRAM W/LOWER EXTREMITY N/A 06/24/2018   Procedure: ABDOMINAL AORTOGRAM W/LOWER EXTREMITY;  Surgeon: Serafina Mitchell, MD;  Location: [place] CV LAB;  Service: Cardiovascular;  Laterality: N/A;  . APPENDECTOMY    . BREAST SURGERY  2009   lumpectomy Rt breast  . CHOLECYSTECTOMY    . FEET SURGERY    . FEMORAL-POPLITEAL BYPASS GRAFT Right 07/09/2018   Procedure: RIGHT FEMORAL ARTERY TO BELOW THE KNEE POPLITEAL ARTERY BYPASS GRAFT USING SAPHENOUS VEIN;  Surgeon: Serafina Mitchell, MD;  Location: [place];  Service: Vascular;  Laterality: Right;  . GROIN DISSECTION Right 07/09/2018   Procedure: REDO RIGHT COMMON FEMORAL EXPLORATION;  Surgeon: Serafina Mitchell, MD;  Location: [facility] OR;  Service: Vascular;  Laterality: Right;  . LOWER EXTREMITY ANGIOGRAPHY Bilateral 07/16/2017   Procedure: Lower Extremity Angiography;  Surgeon: Serafina Mitchell, MD;  Location: [place] CV LAB;  Service: Cardiovascular;  Laterality: Bilateral;  . PR VEIN BYPASS GRAFT,AORTO-FEM-POP    . TUBAL LIGATION    . VEIN HARVEST Right 07/09/2018   Procedure: RIGHT LEG SAPHENOUS VEIN HARVEST;  Surgeon: Serafina Mitchell, MD;  Location: [facility] OR;  Service: Vascular;  Laterality: Right;     Social History   reports that she has been smoking cigarettes. She has a  32.50 pack-year smoking history. She has never used smokeless tobacco. She reports previous alcohol use. She reports that she does not use drugs.   Family History   Her family history includes Cancer in her father and sister; Heart disease in her mother. There is no history of Anesthesia problems.   Allergies Allergies  Allergen Reactions  . Penicillins Swelling and Rash    Has patient had a PCN reaction causing immediate rash, facial/tongue/throat swelling, SOB or lightheadedness with hypotension: No Has patient had a PCN reaction causing severe rash involving mucus membranes or skin necrosis: No Has patient had a PCN reaction that required hospitalization: No Has patient had a PCN reaction  occurring within the last 10 years:No If all of the above answers are "NO", then may proceed with Cephalosporin use.      Home Medications  Prior to Admission medications   Medication Sig Start Date End Date Taking? Authorizing Provider  ACCU-CHEK AVIVA PLUS test strip  02/17/19   [provider]  Accu-Chek FastClix Lancets MISC  02/17/19   [provider]  alendronate (FOSAMAX) 70 MG tablet Take 70 mg by mouth once a week. Take with a full glass of water on an empty stomach.  Takes on Friday    [provider]  amLODipine-benazepril (LOTREL) 5-40 MG capsule Take 1 capsule by mouth at bedtime. 05/15/17   [provider]  anastrozole (ARIMIDEX) 1 MG tablet  02/17/19   [provider]  aspirin 325 MG tablet Take 325 mg by mouth daily.    [provider]  atenolol (TENORMIN) 100 MG tablet Take 100 mg by mouth daily.    [provider]  atorvastatin (LIPITOR) 40 MG tablet Take 40 mg by mouth at bedtime. 05/15/17   [provider]  gabapentin (NEURONTIN) 300 MG capsule Take 300 mg by mouth 2 (two) times daily.    [provider]  hydrALAZINE (APRESOLINE) 25 MG tablet Take 25 mg by mouth 3 (three) times daily.    [provider]  Insulin Glargine (LANTUS SOLOSTAR) 100 UNIT/ML Solostar Pen Inject 20 Units into the skin daily at 10 pm. Patient taking differently: Inject 10 Units into the skin daily at 10 pm.  07/16/18   Rhyne, Hulen Shouts, PA-C  lansoprazole (PREVACID) 30 MG capsule Take 30 mg by mouth 2 (two) times daily. 05/15/17   [provider]  levothyroxine (SYNTHROID, LEVOTHROID) 137 MCG tablet Take 137 mcg by mouth daily before breakfast.    [provider]  losartan (COZAAR) 100 MG tablet Take 100 mg by mouth daily with breakfast. 06/20/17   [provider]  nicotine (NICODERM CQ - DOSED IN MG/24 HOURS) 14 mg/24hr patch Place 14 mg onto the skin daily.    [provider]  PARoxetine (PAXIL) 40 MG tablet Take 40 mg by mouth every morning.    [provider]  potassium chloride SA (K-DUR,KLOR-CON) 20 MEQ tablet Take 20 mEq by mouth daily.    [provider]  rOPINIRole (REQUIP) 4 MG tablet Take 4 mg by mouth at bedtime.    [provider]  torsemide (DEMADEX) 20 MG tablet Take 40 mg by mouth daily.    [provider]  Nicholes Calamity 31G X 5 MM [place]  02/17/19   [provider]     Critical care time:     Attending attestation to follow  Gilberto Better, MD PGY2, [place] IM Pager: [phone]  Attending Note:  78 year old female with PMH of PE who was started on anti-coag that developed diffuse bleeding.  On exam, remains unresponsive with clear lungs.  I reviewed CXR myself, diffuse edema noted.  Discussed with resident.  Will call renal.  Titrate levo for SBP of 100.  ?HD.  KVO IVF.  Continue to monitor coags.  PCCM will continue to manage.  The patient is critically ill with multiple organ systems failure and requires high complexity decision making for assessment and support, frequent evaluation and titration of therapies, application of advanced monitoring technologies and extensive interpretation of multiple  databases.   Critical Care Time devoted to patient care services described in this note is  85  Minutes. This time reflects time of care of this signee Dr Jennet Maduro. This critical care time does not reflect procedure time, or teaching time or supervisory time of PA/NP/Med student/Med Resident etc but could involve care discussion time.  Rush Farmer, M.D. [facility] Pulmonary/Critical Care Medicine. Pager: [phone]. After hours pager: [phone].

## 2019-07-02 NOTE — Progress Notes (Addendum)
HEMATOLOGY-ONCOLOGY PROGRESS NOTE  SUBJECTIVE: Remains on ventilator.  No family at bedside. No urine output today.  Per nursing, has been having bleeding from her mouth, nose, and rectum.  Received 10 mg of IV vitamin K earlier today.  REVIEW OF SYSTEMS:   Unable to obtain review of systems secondary to patient condition.  I have reviewed the past medical history, past surgical history, social history and family history with the patient and they are unchanged from previous note.   PHYSICAL EXAMINATION:  Vitals:   07/02/19 1215 07/02/19 1230  BP:    Pulse: 65 65  Resp: (!) 30 19  Temp:    SpO2: 99% (!) 89%   Filed Weights   06/30/19 0445 07/01/19 0500 07/02/19 0410  Weight: 136 lb 3.9 oz (61.8 kg) 138 lb 3.7 oz (62.7 kg) 153 lb 10.6 oz (69.7 kg)    Intake/Output from previous day: 09/09 0701 - 09/10 0700 In: 4260.7 [I.V.:2203.7; ZJ:[id number]; NG/GT:460; IV Piggyback:99.9] Out: 0   General: Frail, critically ill-appearing.     ENT: ET tube in place.  Dried blood noted around oropharynx.  Fresh blood in right nares, packed with gauze.  Mucous membranes are dry.    Respiratory: Diffuse crackles bilaterally.  Cardiovascular:  Regular rate and rhythm, S1/S2, without murmur, rub or gallop.  Anasarca  GI:  abdomen was soft, flat, nontender, nondistended, without organomegaly.  Neuro: Sedated Skin: Multiple areas of bruising noted at IV sites and puncture sites.  LABORATORY DATA:  I have reviewed the data as listed CMP Latest Ref Rng & Units 07/02/2019 07/02/2019 07/02/2019  Glucose 70 - 99 mg/dL - 231(H) -  BUN 8 - 23 mg/dL - 30(H) -  Creatinine 0.44 - 1.00 mg/dL - 2.44(H) -  Sodium 135 - 145 mmol/L 139 139 140  Potassium 3.5 - 5.1 mmol/L 3.6 3.7 3.7  Chloride 98 - 111 mmol/L - 104 -  CO2 22 - 32 mmol/L - 19(L) -  Calcium 8.9 - 10.3 mg/dL - 6.6(L) -  Total Protein 6.5 - 8.1 g/dL - 3.1(L) -  Total Bilirubin 0.3 - 1.2 mg/dL - 3.3(H) -  Alkaline Phos 38 - 126 U/L - 239(H) -  AST  15 - 41 U/L - 228(H) -  ALT 0 - 44 U/L - 58(H) -    Lab Results  Component Value Date   WBC 14.5 (H) 07/02/2019   HGB 7.7 (L) 07/02/2019   HCT 22.3 (L) 07/02/2019   MCV 88.1 07/02/2019   PLT 62 (L) 07/02/2019   NEUTROABS 13.1 (H) 07/01/2019    US Renal  Result Date: 07/01/2019 CLINICAL DATA:  Acute kidney injury EXAM: RENAL / URINARY TRACT ULTRASOUND COMPLETE COMPARISON:  None. FINDINGS: Right Kidney: Renal measurements: 10.6 x 4.5 x 5.6 cm = volume: 138 mL. Echogenic renal parenchyma. Cortical thinning. No hydronephrosis. Left Kidney: Renal measurements: 11.5 x 5.0 x 5.9 cm = volume: 176 mL. Echogenic renal parenchyma. Cortical thinning. No hydronephrosis. Bladder: Decompressed with indwelling Foley catheter. Additional comments: Bilateral pleural effusions.  Ascites. IMPRESSION: No hydronephrosis. Echogenic renal parenchyma with cortical thinning bilaterally, suggesting medical renal disease. Bladder decompressed with indwelling Foley catheter. Electronically Signed   By: Julian Hy M.D.   On: 07/01/2019 15:21   Dg Chest Port 1 View  Result Date: 07/02/2019 CLINICAL DATA:  Respiratory failure. EXAM: PORTABLE CHEST 1 VIEW COMPARISON:  Radiograph of July 01, 2019. FINDINGS: The heart size and mediastinal contours are within normal limits. Endotracheal and nasogastric tubes are in grossly good position.  Left internal jugular catheter is unchanged and in good position. No pneumothorax is noted. Stable bilateral interstitial lung opacities are noted concerning for edema or inflammation, with stable bilateral pleural effusions. The visualized skeletal structures are unremarkable. IMPRESSION: Stable bilateral lung opacities are noted concerning for edema or inflammation with stable mild bilateral pleural effusions. Stable support apparatus. Aortic Atherosclerosis (ICD10-I70.0). Electronically Signed   By: Marijo Conception M.D.   On: 07/02/2019 10:16   Dg Chest Port 1 View  Result Date:  07/01/2019 CLINICAL DATA:  Intubated patient with acute respiratory failure and acute kidney injury. EXAM: PORTABLE CHEST 1 VIEW COMPARISON:  Single-view of the chest 07/16/2019. PA and lateral chest 06/24/2019. FINDINGS: Support tubes and lines are unchanged since yesterday's examination. Extensive bilateral airspace disease persists but appears mildly improved. There are bilateral pleural effusions. No pneumothorax. Heart size is normal. IMPRESSION: Support apparatus projects in good position and is unchanged. Mild improvement in diffuse bilateral airspace disease. Pleural effusions again seen. Electronically Signed   By: Inge Rise M.D.   On: 07/01/2019 11:00   Dg Chest Port 1 View  Result Date: 07/18/2019 CLINICAL DATA:  78 year old female with central line placement. EXAM: PORTABLE CHEST 1 VIEW COMPARISON:  Chest radiograph dated 07/17/2019 and CT dated 01/06/2019 FINDINGS: Endotracheal and enteric tube in similar position. There has been interval placement of a left IJ central venous line with tip close to the cavoatrial junction. No pneumothorax. No significant interval change in the appearance of the lungs since the earlier radiograph. Stable cardiac silhouette. Atherosclerotic calcification of the aorta. No acute osseous pathology. IMPRESSION: Interval placement of a left IJ central venous line with tip close to the cavoatrial junction. No pneumothorax. Electronically Signed   By: Anner Crete M.D.   On: 06/24/2019 17:10   Dg Chest Port 1 View  Result Date: 07/06/2019 CLINICAL DATA:  Status post intubation. EXAM: PORTABLE CHEST 1 VIEW COMPARISON:  07/14/2019 FINDINGS: The endotracheal tube tip is stable above the carina. Enteric tube tip is below the GE junction. Normal heart size. Bilateral pleural effusions are identified, increased from previous exam. Diffuse bilateral interstitial and airspace opacities are again noted and appear unchanged. IMPRESSION: 1. Satisfactory position of ET tube  and enteric tube. 2. No change in aeration of both lungs. 3. Increase in bilateral pleural effusions. Electronically Signed   By: Kerby Moors M.D.   On: 07/12/2019 14:06    ASSESSMENT AND PLAN: 1.  Acute hemorrhagic shock secondary bleeding from aspirin and exaggerated effect of Eliquis  2.  Acute liver failure 3.  AKI 4.  Malnutrition 5.  Ventilator dependent respiratory failure 6.  Bacterial sepsis? 7.  Coagulopathy and thrombocytopenia 8.  Possible pulmonary embolism  [name]  continues to have intermittent bleeding.  This is likely multifactorial and related to recent aspirin use and exaggerated affect of Eliquis in the setting of AKI, acute liver failure, and poor nutritional status.  She now has thrombocytopenia.  Recommendations 1.  Monitor daily PT/INR, PTT, and fibrinogen 2.  Continue vitamin K 10 mg either IV or via feeding tube daily through tomorrow 3.  Continue supportive care with packed red blood cell transfusion and FFP.  If fibrinogen is less than 150, administer cryoprecipitate. 4.  Continue to hold anticoagulation for the next few days.    She is not a candidate for anticoagulation with persistent coagulopathy and ongoing bleeding. 5.  Consider ENT evaluation for persistent mouth/nose bleeding 6.  Transfuse platelets for a count of less than  20,000   LOS: 3 days   Mikey Bussing, DNP, AGPCNP-BC, AOCNP 07/02/19   Ms. Cardinal was examined and chart reviewed.  She continues to have bleeding from the mouth.  No other apparent active bleeding on exam today.  She continues to have a coagulopathy after receiving vitamin K and FFP.  The persistent coagulopathy is likely secondary to sepsis and liver failure.  She now has thrombocytopenia, also likely related to sepsis and bone marrow suppression.  Her prognosis is poor.  Critical care medicine continues communication the family regarding goals of care.

## 2019-07-02 NOTE — Progress Notes (Signed)
Nutrition Follow-up  DOCUMENTATION CODES:   Severe malnutrition in context of chronic illness  INTERVENTION:    Increase Vital AF 1.2 by 10 ml every 4 hours to goal rate of 40 ml/h with Pro-stat 30 ml BID.    Provides 1352 kcal, 102 gm protein, 779 ml free water daily.   NUTRITION DIAGNOSIS:   Severe Malnutrition related to chronic illness(COPD) as evidenced by severe fat depletion, severe muscle depletion.  Ongoing   GOAL:   Patient will meet greater than or equal to 90% of their needs  Progressing   MONITOR:   Vent status, TF tolerance, Skin, Labs  ASSESSMENT:   78 yo female admitted from [facility] with hip fx s/p fall, PE, UTI. PMH includes hypothyroidism, PVD, breast cancer, HTN, DM-2, GERD, COPD.  Discussed patient in ICU rounds and with RN today. Patient is receiving Vital AF 1.2 at 20 ml/h. MD is ready to advance to goal rate given very poor nutritional status.  Patient remains intubated on ventilator support MV: 11.6 L/min Temp (24hrs), Avg:98.3 F (36.8 C), Min:97.6 F (36.4 C), Max:99 F (37.2 C)   Labs reviewed.  Magnesium 2.1 WNL 9/9. Phosphorus 6.9 (H) 9/9. CBG's: 209-194  Medications reviewed and include levophed, IV vitamin K, novolog.   Weight up 9.5 kg since admission with positive volume status. I/O Net + 10 L. Severe edema present today.  No UOP today. May require CRRT, discussing goals of care with family. Will need to increase protein provision if CRRT is initiated. Code status changed to limited code. Patient still bleeding, had a large bloody BM this morning.  Diet Order:   Diet Order            Diet NPO time specified  Diet effective now              EDUCATION NEEDS:   Not appropriate for education at this time  Skin:  Skin Assessment: Skin Integrity Issues: Skin Integrity Issues:: Stage II, Stage I, Incisions Stage I: sacrum Stage II: heel, vertebral column Incisions: thigh  Last BM:  9/10 (type 7, large,  bloody)  Height:   Ht Readings from Last 1 Encounters:  07/09/2019 5\' 2"  (1.575 m)    Weight:   Wt Readings from Last 1 Encounters:  07/02/19 69.7 kg   Admission weight 60.2 kg (BMI=24)  Ideal Body Weight:  50 kg  BMI:  Body mass index is 28.1 kg/m.  Estimated Nutritional Needs:   Kcal:  1355  Protein:  100-120 gm  Fluid:  1.5 L    Molli Barrows, RD, LDN, [place] Pager [phone] After Hours Pager [phone]

## 2019-07-02 NOTE — Progress Notes (Signed)
Referred by nurse at [facility] who suggested son might benefit from pastoral support as he has just lost his father recently.  Established a relationship of care and support.  Son is primary caretaker of his mother, living in home with her.. Son and mother took care of father several years until he died of dementia in 10-15-23.  Mother fell in Feb and broke her hip.  She ws recovering well until she fell again last week fracturing her other hip resulting in cascading events that brought her here.  Son spoke of his faith and that of his mother.   Asked for prayers for his mother.  Son and Bonney Roussel prayed together at her bedside. Chaplain will continue to follow up.  De Burrs Chaplain Resident [phone]

## 2019-07-02 NOTE — Procedures (Signed)
Hemodialysis Catheter Insertion Procedure Note [name] [id number] 02/01/1941  Procedure: Insertion of Hemodialysis Catheter Indications: Dialysis Access   Procedure Details Consent: Risks of procedure as well as the alternatives and risks of each were explained to the (patient/caregiver).  Consent for procedure obtained. Time Out: Verified patient identification, verified procedure, site/side was marked, verified correct patient position, special equipment/implants available, medications/allergies/relevent history reviewed, required imaging and test results available.  Performed  Maximum sterile technique was used including antiseptics, cap, gloves, gown, hand hygiene, mask and sheet. Skin prep: Chlorhexidine; local anesthetic administered Triple lumen hemodialysis catheter was inserted into right internal jugular vein using the Seldinger technique and placement confirmed with Ultrasound  Evaluation Blood flow good Complications: No apparent complications Patient did tolerate procedure well. Chest X-ray ordered to verify placement.  CXR: pending.   [name] 07/02/2019

## 2019-07-02 NOTE — Consult Note (Signed)
Raeana Blinn Molinari Admit Date: 06/28/2019 07/02/2019 Rexene Agent Requesting Physician:  Nelda Marseille MD  Reason for Consult:  AKI HPI:  78 year old female originally presented to [facility] on 8/28 after having a hip fracture and undergoing TAH on 8/29.  On 9/3, at outside facility, concern for PE.  Did not have contrasted CT but had a V/Q with perfusion mismatch, and was started on apixaban.  Thereafter the patient started bleeding from several different sites including IVs,  Epistaxis, incisional site.  Found to have significant coagulopathy with INR of 10.  Hematology has been consulted and has been treated with numerous blood products PRBCs, FFP, PCC's.  Appears patient has fairly normal baseline GFR.  Transferred serum creatinine was 1.6 and has progressed to 2.4 today.  Patient has been anuric for the past several days.  She is nearly 10 L positive but does have significant weeping present.  She has 4+ edema in the gravity dependent positions.  Potassium is normal, has a mild acidosis with serum bicarbonate 19.  Albumin is very low, 1.2 this morning.  Renal ultrasound on 9/9 with 10.6 cm and 11.5 cm right and left kidney, respectively.  Echogenic renal parenchyma bilaterally.  No hydronephrosis.  The bladder was decompressed with Foley catheter present.  No mention of clots present.  She was noted to have ascites and bilateral pleural effusions.  Urine analysis not been performed, unable to collect.  No significant nephrotoxic exposure, specifically no identified exposure to NSAIDs or IV contrast.  Currently patient is ventilated on full support.  Hypotensive/shock requiring norepinephrine.  PMH Incudes:  History of breast cancer  Diabetes   Creatinine, Ser (mg/dL)  Date Value  07/02/2019 2.44 (H)  07/01/2019 2.11 (H)  06/30/2019 1.69 (H)  06/24/2019 1.60 (H)  07/12/2019 1.63 (H)  07/13/2018 1.25 (H)  07/12/2018 1.42 (H)  07/11/2018 1.88 (H)  07/10/2018 1.33 (H)   07/09/2018 0.91  ] ROS Patient unable to provide any further review of systems. PMH  Past Medical History:  Diagnosis Date  . AAA (abdominal aortic aneurysm) ([place]) 02/26/2012  . Arthritis   . 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 Blood transfusion 2011  . Cancer (HCC)    BREAST  . Claudication of both lower extremities ([place]) 05/28/2017  . COPD (chronic obstructive pulmonary disease) ([place])   . Depression   . Diabetes mellitus    type 2 IDDM x 10 years  . Diabetes mellitus with peripheral vascular disease ([place]) 05/28/2017  . GERD (gastroesophageal reflux disease)   . Hip pain   . Hypertension   . Hypothyroidism   . Iliac artery occlusion ([place]) 02/26/2012  . Myocardial infarction [facility])    unsure, looks as if she may have had one in the past  . Peripheral vascular disease ([place])   . PVD (peripheral vascular disease) with claudication ([place]) 02/26/2012  . Sleep apnea    does not use cpap   . Type II or unspecified type diabetes mellitus with peripheral circulatory disorders, uncontrolled(250.72) 06/15/2014   PSH  Past Surgical History:  Procedure Laterality Date  . ABDOMINAL ANGIOGRAM N/A 01/31/2012   Procedure: ABDOMINAL ANGIOGRAM;  Surgeon: Conrad Fries, MD;  Location[facility] C[facility] LAB;  Service: Cardiovascular;  Laterality: N/A;  . ABDOMINAL AORTOGRAM N/A 07/16/2017   Procedure: ABDOMINAL AORTOGRAM;  Surgeon: Serafina Mitchell, MD;  Location: [place] CV LAB;  Service: Cardiovascular;  Laterality: N/A;  . ABDOMINAL AORTOGRAM W/LOWER EXTREMITY N/A 06/24/2018  Procedure: ABDOMINAL AORTOGRAM W/LOWER EXTREMITY;  Surgeon: Serafina Mitchell, MD;  Location: [place] CV LAB;  Service: Cardiovascular;  Laterality: N/A;  . APPENDECTOMY    . BREAST SURGERY  2009   lumpectomy Rt breast  . CHOLECYSTECTOMY    . FEET SURGERY    . FEMORAL-POPLITEAL BYPASS GRAFT Right 07/09/2018   Procedure: RIGHT FEMORAL ARTERY TO BELOW THE KNEE  POPLITEAL ARTERY BYPASS GRAFT USING SAPHENOUS VEIN;  Surgeon: Serafina Mitchell, MD;  Location: [place];  Service: Vascular;  Laterality: Right;  . GROIN DISSECTION Right 07/09/2018   Procedure: REDO RIGHT COMMON FEMORAL EXPLORATION;  Surgeon: Serafina Mitchell, MD;  Location: [facility] OR;  Service: Vascular;  Laterality: Right;  . LOWER EXTREMITY ANGIOGRAPHY Bilateral 07/16/2017   Procedure: Lower Extremity Angiography;  Surgeon: Serafina Mitchell, MD;  Location: [place] CV LAB;  Service: Cardiovascular;  Laterality: Bilateral;  . PR VEIN BYPASS GRAFT,AORTO-FEM-POP    . TUBAL LIGATION    . VEIN HARVEST Right 07/09/2018   Procedure: RIGHT LEG SAPHENOUS VEIN HARVEST;  Surgeon: Serafina Mitchell, MD;  Location: [facility] OR;  Service: Vascular;  Laterality: Right;   FH  Family History  Problem Relation Age of Onset  . Heart disease Mother   . Cancer Father        LUNG  . Cancer Sister        BONE  . Anesthesia problems Neg Hx    SH  reports that she has been smoking cigarettes. She has a 32.50 pack-year smoking history. She has never used smokeless tobacco. She reports previous alcohol use. She reports that she does not use drugs. Allergies  Allergies  Allergen Reactions  . Penicillins Swelling and Rash    Has patient had a PCN reaction causing immediate rash, facial/tongue/throat swelling, SOB or lightheadedness with hypotension: No Has patient had a PCN reaction causing severe rash involving mucus membranes or skin necrosis: No Has patient had a PCN reaction that required hospitalization: No Has patient had a PCN reaction occurring within the last 10 years:No If all of the above answers are "NO", then may proceed with Cephalosporin use.    Home medications Prior to Admission medications   Medication Sig Start Date End Date Taking? Authorizing Provider  ACCU-CHEK AVIVA PLUS test strip  02/17/19  Yes [provider]  Accu-Chek FastClix Lancets [place]  02/17/19  Yes [provider]   aspirin EC 81 MG tablet Take 81 mg by mouth daily.   Yes [provider]  atenolol (TENORMIN) 100 MG tablet Take 100 mg by mouth daily.   Yes [provider]  atorvastatin (LIPITOR) 40 MG tablet Take 40 mg by mouth at bedtime. 05/15/17  Yes [provider]  gabapentin (NEURONTIN) 300 MG capsule Take 300 mg by mouth daily.    Yes [provider]  hydrALAZINE (APRESOLINE) 25 MG tablet Take 25 mg by mouth 3 (three) times daily.   Yes [provider]  lansoprazole (PREVACID) 30 MG capsule Take 30 mg by mouth 2 (two) times daily. 05/15/17  Yes [provider]  levothyroxine (SYNTHROID) 125 MCG tablet Take 125 mcg by mouth daily before breakfast.   Yes [provider]  Multiple Vitamin (MULTIVITAMIN WITH MINERALS) TABS tablet Take 1 tablet by mouth daily.   Yes [provider]  PARoxetine (PAXIL) 40 MG tablet Take 40 mg by mouth every morning.   Yes [provider]  rOPINIRole (REQUIP) 4 MG tablet Take 4 mg by mouth at bedtime.  Yes [provider]  UNIFINE PENTIPS 31G X 5 MM [place]  02/17/19   [provider]    Current Medications Scheduled Meds: . sodium chloride   Intravenous Once  . chlorhexidine gluconate (MEDLINE KIT)  15 mL Mouth Rinse BID  . Chlorhexidine Gluconate Cloth  6 each Topical Daily  . feeding supplement (PRO-STAT SUGAR FREE 64)  30 mL Per Tube BID  . feeding supplement (VITAL AF 1.2 CAL)  1,000 mL Per Tube Q24H  . fentaNYL (SUBLIMAZE) injection  25 mcg Intravenous Once  . insulin aspart  0-9 Units Subcutaneous Q4H  . levothyroxine  125 mcg Per Tube Q0600  . mouth rinse  15 mL Mouth Rinse 10 times per day  . pantoprazole (PROTONIX) IV  40 mg Intravenous Q12H   Continuous Infusions: . fentaNYL infusion INTRAVENOUS Stopped (07/01/19 1338)  . norepinephrine (LEVOPHED) Adult infusion 4 mcg/min (07/02/19 1200)   PRN Meds:.fentaNYL  CBC Recent Labs  Lab 07/10/2019 1310   07/01/19 0522  07/01/19 2204  07/02/19 0400 07/02/19 0551 07/02/19 1023  WBC 15.3*   < > 15.2*   < > 10.4  --  11.8*  --  14.5*  NEUTROABS 13.8*  --  13.1*  --   --   --   --   --   --   HGB 6.7*   < > 7.5*   < > 8.4*   < > 7.8* 7.1* 7.7*  HCT 20.8*   < > 23.2*   < > 24.4*   < > 22.8* 21.0* 22.3*  MCV 93.3   < > 93.5   < > 90.0  --  90.1  --  88.1  PLT 170   < > 134*   < > 76*  --  66*  --  62*   < > = values in this interval not displayed.   Basic Metabolic Panel Recent Labs  Lab 06/28/2019 1354  06/27/2019 2232 06/30/19 0440 06/30/19 1200 07/01/19 0522 07/01/19 1125 07/01/19 1251 07/01/19 1734 07/01/19 1917 07/02/19 0121 07/02/19 0403 07/02/19 0551  NA 135   < > 136 137  --  138 137 138 139 140 140 139 139  K 4.8   < > 3.7 3.5  --  4.5 4.5 4.3 4.0 4.1 3.7 3.7 3.6  CL 108  --  106 107  --  109  --   --   --   --   --  104  --   CO2 20*  --  19* 19*  --  17*  --   --   --   --   --  19*  --   GLUCOSE 88  --  124* 97  --  113*  --   --   --   --   --  231*  --   BUN 36*  --  33* 34*  --  33*  --   --   --   --   --  30*  --   CREATININE 1.63*  --  1.60* 1.69*  --  2.11*  --   --   --   --   --  2.44*  --   CALCIUM 6.9*  --  7.0* 7.0*  --  6.6*  --   --   --   --   --  6.6*  --   PHOS  --   --   --   --  5.9* 6.9*  --   --   --   --   --   --   --    < > =  values in this interval not displayed.    Physical Exam  Blood pressure (!) 133/42, pulse 65, temperature 97.6 F (36.4 C), temperature source Axillary, resp. rate 19, height '5\' 2"'  (1.575 m), weight 69.7 kg, SpO2 (!) 89 %. GEN: Intubated, sedated elderly female ENT: NCAT, ET tube in place  EYES: EOMI CV: Regular, normal S1 and S2, no rub PULM: Coarse breath sounds bilaterally ABD: Abdominal wall edema, soft SKIN: No rash EXT: 4+ edema and gravity dependent positions  Assessment 78 year old female with anuric AKI from a fairly normal baseline creatinine after left hip repair and severe bleeding diathesis related to  apixaban, potential other factors.  Hematology following.  Has shock, significant ABLA.  No obstruction on renal ultrasound from 9/9.  1. Anuric AKI, presumed normal baseline creatinine, most likely is ischemic ATN; massive hypervolemia 2. Bleeding diathesis following apixaban, severe, hematology following 3. Anemia, related to #1 and #2 4. Status post hip fracture with TAH on 8/29, 5. Question PE at outside facility based upon VQ scan 6. Metabolic acidosis 7. History of breast cancer 8. Diabetes  Plan 1. Given volume status, anuric state, reasonable to initiate CRRT.  500/1500/500 All 4K. No heparin or citrate.  Goal UF 69m/hr neg net negative.  Do not uptitrate pressors to achieve net negative.  Do not uptitrate pressors to achieve 2. CCM will assist with HD catheter 3. Reviewed apixaban pharmacodynamics with pharmacy, I do not anticipate that CRRT will improve/facilitate clearance of any remaining portion of the drug   RRexene Agent [id number] 07/02/2019, 1:34 PM

## 2019-07-02 NOTE — Progress Notes (Addendum)
Inpatient Diabetes Program Recommendations  AACE/ADA: New Consensus Statement on Inpatient Glycemic Control (2015)  Target Ranges:  Prepandial:   less than 140 mg/dL      Peak postprandial:   less than 180 mg/dL (1-2 hours)      Critically ill patients:  140 - 180 mg/dL   Lab Results  Component Value Date   GLUCAP 209 (H) 07/02/2019   HGBA1C 6.4 (H) 07/18/2019    Review of Glycemic Control Results for [name], [name] (MRN [id number]) as of 07/02/2019 08:59  Ref. Range 07/01/2019 08:03 07/01/2019 11:51 07/01/2019 15:48 07/01/2019 20:35 07/02/2019 01:01 07/02/2019 04:52 07/02/2019 08:23  Glucose-Capillary Latest Ref Range: 70 - 99 mg/dL 102 (H) 89 165 (H) 179 (H) 222 (H) 234 (H) 209 (H)   Diabetes history: DM 2 Outpatient Diabetes medications: None Current orders for Inpatient glycemic control:  Novolog 0-9 units Q4 hours  Inpatient Diabetes Program Recommendations:    Glucose trends in 200's. Tube Feeds running 20 ml/hour If in the plan of care consider: Novolog 2 units Q4 hours Tube Feed Coverage.  Thanks,  Tama Headings RN, MSN, BC-ADM Inpatient Diabetes Coordinator Team Pager [phone] (8a-5p)

## 2019-07-03 ENCOUNTER — Inpatient Hospital Stay (HOSPITAL_COMMUNITY): Payer: Medicare HMO

## 2019-07-03 DIAGNOSIS — R4020 Unspecified coma: Secondary | ICD-10-CM

## 2019-07-03 DIAGNOSIS — Z7189 Other specified counseling: Secondary | ICD-10-CM

## 2019-07-03 DIAGNOSIS — Z515 Encounter for palliative care: Secondary | ICD-10-CM

## 2019-07-03 DIAGNOSIS — K7290 Hepatic failure, unspecified without coma: Secondary | ICD-10-CM

## 2019-07-03 LAB — POCT I-STAT 7, (LYTES, BLD GAS, ICA,H+H)
Acid-base deficit: 11 mmol/L — ABNORMAL HIGH (ref 0.0–2.0)
Acid-base deficit: 10 mmol/L — ABNORMAL HIGH (ref 0.0–2.0)
Bicarbonate: 16.4 mmol/L — ABNORMAL LOW (ref 20.0–28.0)
Bicarbonate: 16.9 mmol/L — ABNORMAL LOW (ref 20.0–28.0)
Calcium, Ion: 1.13 mmol/L — ABNORMAL LOW (ref 1.15–1.40)
Calcium, Ion: 1.11 mmol/L — ABNORMAL LOW (ref 1.15–1.40)
HCT: 21 % — ABNORMAL LOW (ref 36.0–46.0)
HCT: 21 % — ABNORMAL LOW (ref 36.0–46.0)
Hemoglobin: 7.1 g/dL — ABNORMAL LOW (ref 12.0–15.0)
Hemoglobin: 7.1 g/dL — ABNORMAL LOW (ref 12.0–15.0)
O2 Saturation: 98 %
O2 Saturation: 99 %
Patient temperature: 38.4
Patient temperature: 96.2
Potassium: 4.1 mmol/L (ref 3.5–5.1)
Potassium: 3.9 mmol/L (ref 3.5–5.1)
Sodium: 139 mmol/L (ref 135–145)
Sodium: 140 mmol/L (ref 135–145)
TCO2: 18 mmol/L — ABNORMAL LOW (ref 22–32)
TCO2: 18 mmol/L — ABNORMAL LOW (ref 22–32)
pCO2 arterial: 44.4 mmHg (ref 32.0–48.0)
pCO2 arterial: 36.9 mmHg (ref 32.0–48.0)
pH, Arterial: 7.185 — CL (ref 7.350–7.450)
pH, Arterial: 7.261 — ABNORMAL LOW (ref 7.350–7.450)
pO2, Arterial: 129 mmHg — ABNORMAL HIGH (ref 83.0–108.0)
pO2, Arterial: 129 mmHg — ABNORMAL HIGH (ref 83.0–108.0)

## 2019-07-03 LAB — RENAL FUNCTION PANEL
Albumin: 1.2 g/dL — ABNORMAL LOW (ref 3.5–5.0)
Anion gap: 15 (ref 5–15)
BUN: 20 mg/dL (ref 8–23)
CO2: 17 mmol/L — ABNORMAL LOW (ref 22–32)
Calcium: 7.4 mg/dL — ABNORMAL LOW (ref 8.9–10.3)
Chloride: 107 mmol/L (ref 98–111)
Creatinine, Ser: 1.78 mg/dL — ABNORMAL HIGH (ref 0.44–1.00)
GFR calc Af Amer: 31 mL/min — ABNORMAL LOW (ref >60)
GFR calc non Af Amer: 27 mL/min — ABNORMAL LOW (ref >60)
Glucose, Bld: 64 mg/dL — ABNORMAL LOW (ref 70–99)
Phosphorus: 4.2 mg/dL (ref 2.5–4.6)
Potassium: 4.2 mmol/L (ref 3.5–5.1)
Sodium: 139 mmol/L (ref 135–145)

## 2019-07-03 LAB — CBC
HCT: 24.1 % — ABNORMAL LOW (ref 36.0–46.0)
Hemoglobin: 7.9 g/dL — ABNORMAL LOW (ref 12.0–15.0)
MCH: 30.3 pg (ref 26.0–34.0)
MCHC: 32.8 g/dL (ref 30.0–36.0)
MCV: 92.3 fL (ref 80.0–100.0)
Platelets: 43 10*3/uL — ABNORMAL LOW (ref 150–400)
RBC: 2.61 MIL/uL — ABNORMAL LOW (ref 3.87–5.11)
RDW: 15.9 % — ABNORMAL HIGH (ref 11.5–15.5)
WBC: 18 10*3/uL — ABNORMAL HIGH (ref 4.0–10.5)
nRBC: 0.3 % — ABNORMAL HIGH (ref 0.0–0.2)

## 2019-07-03 LAB — GLUCOSE, CAPILLARY
Glucose-Capillary: 105 mg/dL — ABNORMAL HIGH (ref 70–99)
Glucose-Capillary: 110 mg/dL — ABNORMAL HIGH (ref 70–99)
Glucose-Capillary: 148 mg/dL — ABNORMAL HIGH (ref 70–99)
Glucose-Capillary: 31 mg/dL — CL (ref 70–99)
Glucose-Capillary: 67 mg/dL — ABNORMAL LOW (ref 70–99)
Glucose-Capillary: 70 mg/dL (ref 70–99)
Glucose-Capillary: 88 mg/dL (ref 70–99)

## 2019-07-03 LAB — HEPATIC FUNCTION PANEL
ALT: 68 U/L — ABNORMAL HIGH (ref 0–44)
AST: 245 U/L — ABNORMAL HIGH (ref 15–41)
Albumin: 1.2 g/dL — ABNORMAL LOW (ref 3.5–5.0)
Alkaline Phosphatase: 315 U/L — ABNORMAL HIGH (ref 38–126)
Bilirubin, Direct: 2.6 mg/dL — ABNORMAL HIGH (ref 0.0–0.2)
Indirect Bilirubin: 1.4 mg/dL — ABNORMAL HIGH (ref 0.3–0.9)
Total Bilirubin: 4 mg/dL — ABNORMAL HIGH (ref 0.3–1.2)
Total Protein: 3.4 g/dL — ABNORMAL LOW (ref 6.5–8.1)

## 2019-07-03 LAB — PROTIME-INR
INR: 1.9 — ABNORMAL HIGH (ref 0.8–1.2)
Prothrombin Time: 21.6 seconds — ABNORMAL HIGH (ref 11.4–15.2)

## 2019-07-03 LAB — PHOSPHORUS: Phosphorus: 4.3 mg/dL (ref 2.5–4.6)

## 2019-07-03 MED ORDER — DEXTROSE 50 % IV SOLN
25.0000 g | INTRAVENOUS | Status: AC
  Administered 2019-07-03: 08:00:00 25 g via INTRAVENOUS
  Filled 2019-07-03: qty 50

## 2019-07-03 MED ORDER — LORAZEPAM 2 MG/ML IJ SOLN
1.0000 mg | INTRAMUSCULAR | Status: DC | PRN
  Administered 2019-07-03: 06:00:00 2 mg via INTRAVENOUS
  Filled 2019-07-03: qty 1

## 2019-07-03 MED ORDER — SODIUM CHLORIDE 0.9 % IV SOLN
750.0000 mg | Freq: Two times a day (BID) | INTRAVENOUS | Status: DC
  Filled 2019-07-03: qty 7.5

## 2019-07-03 MED ORDER — DEXTROSE 50 % IV SOLN
25.0000 g | INTRAVENOUS | Status: AC
  Administered 2019-07-03: 10:00:00 25 g via INTRAVENOUS
  Filled 2019-07-03: qty 50

## 2019-07-03 MED ORDER — LEVETIRACETAM IN NACL 1000 MG/100ML IV SOLN
1000.0000 mg | Freq: Once | INTRAVENOUS | Status: AC
  Administered 2019-07-03: 1000 mg via INTRAVENOUS
  Filled 2019-07-03 (×2): qty 100

## 2019-07-03 MED ORDER — MORPHINE BOLUS VIA INFUSION
5.0000 mg | INTRAVENOUS | Status: DC | PRN
  Filled 2019-07-03: qty 5

## 2019-07-03 MED ORDER — MORPHINE 100MG IN NS 100ML (1MG/ML) PREMIX INFUSION
0.0000 mg/h | INTRAVENOUS | Status: DC
  Filled 2019-07-03: qty 100

## 2019-07-03 MED ORDER — GLYCOPYRROLATE 1 MG PO TABS: 1.0000 mg | ORAL_TABLET | ORAL | Status: DC | PRN

## 2019-07-03 MED ORDER — MORPHINE SULFATE (PF) 2 MG/ML IV SOLN: 2.0000 mg | INTRAVENOUS | Status: DC | PRN

## 2019-07-03 MED ORDER — POLYVINYL ALCOHOL 1.4 % OP SOLN
1.0000 [drp] | Freq: Four times a day (QID) | OPHTHALMIC | Status: DC | PRN
  Filled 2019-07-03: qty 15

## 2019-07-03 MED ORDER — SODIUM BICARBONATE 8.4 % IV SOLN
100.0000 meq | Freq: Once | INTRAVENOUS | Status: AC
  Administered 2019-07-03: 08:00:00 100 meq via INTRAVENOUS
  Filled 2019-07-03: qty 50

## 2019-07-03 MED ORDER — ACETAMINOPHEN 325 MG PO TABS: 650.0000 mg | ORAL_TABLET | Freq: Four times a day (QID) | ORAL | Status: DC | PRN

## 2019-07-03 MED ORDER — AMIODARONE IV BOLUS ONLY 150 MG/100ML
150.0000 mg | Freq: Once | INTRAVENOUS | Status: AC
  Administered 2019-07-03: 150 mg via INTRAVENOUS
  Filled 2019-07-03: qty 100

## 2019-07-03 MED ORDER — GLYCOPYRROLATE 0.2 MG/ML IJ SOLN: 0.2000 mg | INTRAMUSCULAR | Status: DC | PRN

## 2019-07-03 MED ORDER — ACETAMINOPHEN 650 MG RE SUPP: 650.0000 mg | Freq: Four times a day (QID) | RECTAL | Status: DC | PRN

## 2019-07-03 MED ORDER — DIPHENHYDRAMINE HCL 50 MG/ML IJ SOLN: 25.0000 mg | INTRAMUSCULAR | Status: DC | PRN

## 2019-07-03 MED ORDER — LEVETIRACETAM IN NACL 500 MG/100ML IV SOLN
500.0000 mg | Freq: Two times a day (BID) | INTRAVENOUS | Status: DC
  Filled 2019-07-03 (×2): qty 100

## 2019-07-03 MED ORDER — DEXTROSE 5 % IV SOLN
INTRAVENOUS | Status: DC
  Administered 2019-07-03: 15:00:00 via INTRAVENOUS

## 2019-07-04 LAB — CULTURE, BLOOD (ROUTINE X 2)
Culture: NO GROWTH
Culture: NO GROWTH

## 2019-07-06 ENCOUNTER — Other Ambulatory Visit: Payer: Self-pay

## 2019-07-09 ENCOUNTER — Telehealth: Payer: Self-pay

## 2019-07-09 NOTE — Telephone Encounter (Signed)
Received dc from [facility]. DC is for burial and a patient of Doctor Nelda Marseille.  DC will be taken to [facility] for signature.  On 07/10/2019 Received dc back from Doctor Nelda Marseille. I called [employer] to let [name] know that the dc is ready for pickup.

## 2019-07-17 DIAGNOSIS — R601 Generalized edema: Secondary | ICD-10-CM | POA: Diagnosis not present

## 2019-07-23 NOTE — Progress Notes (Signed)
eLink Physician-Brief Progress Note Patient Name: [name] DOB: 1941/07/27 MRN: [id number]   Date of Service  August 02, 2019  HPI/Events of Note  Pt continues to have episodic seizure-like activity  eICU Interventions  Keppra 750 mg iv Q 12 hours, Ativan 1-2 mg iv Q 1 hour prn seizures, Neurology consultation.        Kerry Kass Babyboy Loya Aug 02, 2019, 5:33 AM

## 2019-07-23 NOTE — Progress Notes (Signed)
Responded to handoff from fellow [place] when called to another service advising Pt son was very upset his mother near death and remaining family called.  Offered bedside prayer with son and Pt.  Escorted son to front of hospital where family members waiting.  Escorted 2 of the 4 family members allowed to visit at bedside to Pt.  Advised family they could have nurse call Chaplain if needed.  De Burrs Chaplain Resident [phone]

## 2019-07-23 NOTE — Progress Notes (Signed)
Charge RN entered chart at this time to provide information to [employer].

## 2019-07-23 NOTE — Progress Notes (Signed)
Son bedside, CT of the brain without evidence of acute bleed.  Patient is bleeding from everywhere at this point.  Spoke with son, explained MODS and that there are no reasonable chance at recovery.  After discussion, decision was made to change to DNR and to begin morphine drip and extubate when the family is ready.  They are upset that they cannot come visit her as a family and I redirected them to speak to administration.  Orders placed.  The patient is critically ill with multiple organ systems failure and requires high complexity decision making for assessment and support, frequent evaluation and titration of therapies, application of advanced monitoring technologies and extensive interpretation of multiple databases.   Critical Care Time devoted to patient care services described in this note is  50  Minutes. This time reflects time of care of this signee Dr Jennet Maduro. This critical care time does not reflect procedure time, or teaching time or supervisory time of PA/NP/Med student/Med Resident etc but could involve care discussion time.  Rush Farmer, M.D. [facility] Pulmonary/Critical Care Medicine. Pager: [phone]. After hours pager: [phone].

## 2019-07-23 NOTE — Progress Notes (Signed)
Chaplain was paged for withdrawal of care. Son, Campbell Lerner, was at the bedside and very tearful. Chaplain prayed with Sammy. Nursing staff worked to allow family to be with [place]. Chaplain remains available per request.   Chaplain Resident, Evelene Croon, M.Div Pager # [phone]

## 2019-07-23 NOTE — Progress Notes (Signed)
Patient expired at [place].Daughter at bedside. 2 RNs verified. Emotional support given to daughter.

## 2019-07-23 NOTE — Progress Notes (Signed)
[facility]    NEPHROLOGY PROGRESS NOTE  SUBJECTIVE: Patient unresponsive, unable to provide review of systems.  Seen earlier this morning.  Chart reviewed, discussed with bedside RN.   OBJECTIVE:  Vitals:   08-02-2019 1415 08/02/19 1430  BP:    Pulse:  (!) 43  Resp: (!) 26 (!) 25  Temp:    SpO2:  100%    Intake/Output Summary (Last 24 hours) at 02-Aug-2019 1456 Last data filed at 02-Aug-2019 1400 Gross per 24 hour  Intake 1873.67 ml  Output 1411 ml  Net 462.67 ml      General: Unresponsive, NAD HEENT: MMM [place] AT anicteric sclera Neck:  No JVD, no adenopathy CV:  Heart RRR  Lungs:  L/S CTA bilaterally Abd:  abd distended, with decreased BS GU:  Bladder non-palpable Extremities: +3 bilateral lower extremity edema Skin:  No skin rash  MEDICATIONS:  . sodium chloride   Intravenous Once  . chlorhexidine gluconate (MEDLINE KIT)  15 mL Mouth Rinse BID  . Chlorhexidine Gluconate Cloth  6 each Topical Daily  . feeding supplement (PRO-STAT SUGAR FREE 64)  30 mL Per Tube BID  . feeding supplement (VITAL AF 1.2 CAL)  1,000 mL Per Tube Q24H  . fentaNYL (SUBLIMAZE) injection  25 mcg Intravenous Once  . insulin aspart  0-9 Units Subcutaneous Q4H  . levothyroxine  125 mcg Per Tube Q0600  . mouth rinse  15 mL Mouth Rinse 10 times per day  . pantoprazole (PROTONIX) IV  40 mg Intravenous Q12H       LABS:   CBC Latest Ref Rng & Units August 02, 2019 Aug 02, 2019 08-02-19  WBC 4.0 - 10.5 K/uL - - 18.0(H)  Hemoglobin 12.0 - 15.0 g/dL 7.1(L) 7.1(L) 7.9(L)  Hematocrit 36.0 - 46.0 % 21.0(L) 21.0(L) 24.1(L)  Platelets 150 - 400 K/uL - - 43(L)    CMP Latest Ref Rng & Units 08/02/2019 August 02, 2019 08/02/2019  Glucose 70 - 99 mg/dL - - 64(L)  BUN 8 - 23 mg/dL - - 20  Creatinine 0.44 - 1.00 mg/dL - - 1.78(H)  Sodium 135 - 145 mmol/L 140 139 139  Potassium 3.5 - 5.1 mmol/L 3.9 4.1 4.2  Chloride 98 - 111 mmol/L - - 107  CO2 22 - 32 mmol/L - - 17(L)  Calcium 8.9 - 10.3 mg/dL - -  7.4(L)  Total Protein 6.5 - 8.1 g/dL - - 3.4(L)  Total Bilirubin 0.3 - 1.2 mg/dL - - 4.0(H)  Alkaline Phos 38 - 126 U/L - - 315(H)  AST 15 - 41 U/L - - 245(H)  ALT 0 - 44 U/L - - 68(H)    Lab Results  Component Value Date   CALCIUM 7.4 (L) August 02, 2019   CAION 1.11 (L) 08/02/2019   PHOS 4.3 August 02, 2019   PHOS 4.2 Aug 02, 2019       Component Value Date/Time   COLORURINE YELLOW 07/04/2018 0823   APPEARANCEUR CLEAR 07/04/2018 0823   LABSPEC 1.010 07/04/2018 0823   PHURINE 6.0 07/04/2018 0823   GLUCOSEU >=500 (A) 07/04/2018 0823   HGBUR SMALL (A) 07/04/2018 0823   BILIRUBINUR NEGATIVE 07/04/2018 0823   KETONESUR NEGATIVE 07/04/2018 0823   PROTEINUR 100 (A) 07/04/2018 0823   UROBILINOGEN 0.2 02/28/2012 1527   NITRITE NEGATIVE 07/04/2018 0823   LEUKOCYTESUR NEGATIVE 07/04/2018 0823      Component Value Date/Time   PHART 7.261 (L) 08-02-19 1031   PCO2ART 36.9 2019/08/02 1031   PO2ART 129.0 (H) 2019/08/02 1031   HCO3 16.9 (L) 2019-08-02 1031  TCO2 18 (L) 15-Jul-2019 1031   ACIDBASEDEF 10.0 (H) 07/15/19 1031   O2SAT 99.0 2019/07/15 1031    No results found for: IRON, TIBC, FERRITIN, IRONPCTSAT     ASSESSMENT/PLAN:    78 year old female with anuric AKI from a fairly normal baseline creatinine after left hip repair and severe bleeding diathesis related to apixaban, potential other factors.  Hematology following.  Has shock, significant ABLA.   1. Anuric AKI, presumed normal baseline creatinine, most likely is ischemic ATN; massive hypervolemia 2. Bleeding diathesis following apixaban, severe, hematology following -bleeding from multiple locations. 3. Anemia, related to #1 and #2 4. Status post hip fracture with TAH on 8/29, 5. Question PE at outside facility based upon VQ scan 6. Metabolic acidosis 7. History of breast cancer 8. Diabetes  Plan for extubation and comfort measures noted.  Overall condition is grave.  Will not likely survive hospitalization.  Agree with  discontinuation of CRRT when family ready.  We will be available as needed.  Please call with questions.  Thanks.   [place], DO, [place]

## 2019-07-23 NOTE — Progress Notes (Signed)
ET tube advanced, per order, by 3 cm.

## 2019-07-23 NOTE — Progress Notes (Addendum)
Brief Hematology Note:  Chart has been reviewed.  Note plan for terminal wean.  1.  Acute hemorrhagic shock secondary bleeding from aspirin and exaggerated effect of Eliquis  2.  Acute liver failure 3.  AKI 4.  Malnutrition 5.  Ventilator dependent respiratory failure 6.  Bacterial sepsis? 7.  Coagulopathy and thrombocytopenia 8.  Possible pulmonary embolism 9.  New onset seizures 07/22/19  Hematology will sign off at this time.  Please call for questions.  Mikey Bussing, DNP, AGPCNP-BC, AOCNP  Ms. Elgart was examined and chart reviewed.  Plan for terminal care noted.  Hematology will sign off of the case.  Please call as needed.

## 2019-07-23 NOTE — Progress Notes (Signed)
Had prolonged discussion with [name] regarding evidence of worsening organ failure including liver, heart and brain overnight with recurrent hypoglycemia, seizures and new onset A.fib with RVR. Discussed goals of care including transitioning from curative therapy to comfort-based care. [name] states he 'does not want her to suffer.' He mentions that it has been a difficult year for his family due to the passing of his father. He states he understands that she will not recover from this hospitalization. He would like to hold off on complete transition until his family arrives and has a chance to see her. At this time he requested to step outside to compose himself and wait for his family.

## 2019-07-23 NOTE — Progress Notes (Signed)
Dahlia Bailiff, RN and Brien Mates, RN wasted 60mL of morphine down the sink.

## 2019-07-23 NOTE — Progress Notes (Signed)
eLink Physician-Brief Progress Note Patient Name: [name] DOB: 11-16-1940 MRN: [id number]   Date of Service  2019-07-23  HPI/Events of Note  Witnessed seizure -like activity with ? Posturing.  eICU Interventions  Non-contrast head CT ordered, cEEG ordered.        [place] U Ogan 07/23/19, 4:00 AM

## 2019-07-23 NOTE — Progress Notes (Signed)
Patient with heart rate in 130s, arterial blood pressures with systolic in [id number]. EKG taken shows a-fib RVR.  Shortly after EKG patient started decorticate posturing with seizure like activity for 2 minutes. Eyes deviating to the left. MD notified. Amiodarone 150mg  ordered, will continue to monitor.

## 2019-07-23 NOTE — Progress Notes (Signed)
MD notified of 3 more seizure like activities.

## 2019-07-23 NOTE — Progress Notes (Signed)
Pt extubated using withdrawal guidelines.  No distress noted.  RN @ bedside.

## 2019-07-23 NOTE — Death Summary Note (Signed)
DEATH SUMMARY   Patient Details  Name: [name] MRN: [id number] DOB: 25-May-1941  Admission/Discharge Information   Admit Date:  2019/07/22  Date of Death: Date of Death: 07/26/19  Time of Death: Time of Death: 02-07-1844  Length of Stay: 4  Referring Physician: Nicoletta Dress, MD   Reason(s) for Hospitalization  Acute respiratory failure and hemorrhagic shock  Diagnoses  Preliminary cause of death:   Refractory shock  Secondary Diagnoses (including complications and co-morbidities):  Active Problems:   Pressure injury of skin   Acute blood loss anemia   Protein-calorie malnutrition, severe   AKI (acute kidney injury) ([place]) Refractory shock Acute respiratory failure with hypoxemia Acute renal failure Hemorrhagic shock  Brief Hospital Course (including significant findings, care, treatment, and services provided and events leading to death)  Mrs.Kandel is a 78 yo F w/ PMH of Hypothyroidism, PAD, HFpEF, HTN, DM and admit for fall at [facility] with hip fracture. Hospital course complicated by PE and UTI now transferred to [facility] for intubation and pressor support after developing diffuse bleeding on hospital day 9. Now with prolonged bleeding with elevated INR requiring multiple transfusions.  8/28 Admit to [facility] 8/29 L Hemiarthroplasty 9/2 UTI, started on abx 9/3 V/Q scan diagnosed LUL defect started on eliquis for PE 9/5 Bleeding events started, eliquis d/c 07/22/23 Worsening bleed w/ hypotension requiring pressors 2023-07-22 Intubated at [facility] 07-22-23 Transfer to [facility] 9/10 Start CRRT  Son bedside, CT of the brain without evidence of acute bleed.  Patient is bleeding from everywhere at this point.  Spoke with son, explained MODS and that there are no reasonable chance at recovery.  After discussion, decision was made to change to DNR and to begin morphine drip and extubate when the family is ready.  Morphine was started and patient was extubated to expire shortly thereafter with  the family bedside.    Pertinent Labs and Studies  Significant Diagnostic Studies Ct Head Wo Contrast  Result Date: Jul 26, 2019 CLINICAL DATA:  Altered mental status. EXAM: CT HEAD WITHOUT CONTRAST TECHNIQUE: Contiguous axial images were obtained from the base of the skull through the vertex without intravenous contrast. COMPARISON:  06/13/2019 FINDINGS: Brain: There is no evidence for acute hemorrhage, hydrocephalus, mass lesion, or abnormal extra-axial fluid collection. No definite CT evidence for acute infarction. Diffuse loss of parenchymal volume is consistent with atrophy. Patchy low attenuation in the deep hemispheric and periventricular white matter is nonspecific, but likely reflects chronic microvascular ischemic demyelination. Vascular: No hyperdense vessel or unexpected calcification. Skull: No evidence for fracture. No worrisome lytic or sclerotic lesion. Sinuses/Orbits: Mucosal thickening noted in the paranasal sinuses with air-fluid levels and opacification of ethmoid air cells. Fluid visible in the mastoid air cells bilaterally with fluid noted in the left middle ear. Visualized portions of the globes and intraorbital fat are unremarkable. Other: Endotracheal and NG tubes noted. IMPRESSION: 1. No acute intracranial abnormality. 2. Atrophy with chronic small vessel white matter ischemic disease. 3. 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 Electronically Signed   By: Misty Stanley M.D.   On: July 26, 2019 12:47   US Renal  Result Date: 07/01/2019 CLINICAL DATA:  Acute kidney injury EXAM: RENAL / URINARY TRACT ULTRASOUND COMPLETE COMPARISON:  None. FINDINGS: Right Kidney: Renal measurements: 10.6 x 4.5 x 5.6 cm = volume: 138 mL. Echogenic renal parenchyma. Cortical thinning. No hydronephrosis. Left Kidney: Renal measurements: 11.5 x 5.0 x 5.9 cm = volume: 176 mL. Echogenic renal parenchyma. Cortical thinning. No hydronephrosis. Bladder: Decompressed with indwelling Foley catheter. Additional  comments: Bilateral pleural effusions.  Ascites. IMPRESSION: No hydronephrosis. Echogenic renal parenchyma with cortical thinning bilaterally, suggesting medical renal disease. Bladder decompressed with indwelling Foley catheter. Electronically Signed   By: Julian Hy M.D.   On: 07/01/2019 15:21   Dg Chest Port 1 View  Result Date: 2019/07/09 CLINICAL DATA:  Ventilator EXAM: PORTABLE CHEST 1 VIEW COMPARISON:  07/02/2019 FINDINGS: Endotracheal tube approximately 7 cm above the carina. This could be advanced 3 cm. Right jugular catheter tip in the distal jugular vein. Left jugular central venous catheter tip in the SVC at the cavoatrial junction unchanged. NG tube in the stomach Severe diffuse bilateral airspace disease. Mild improved aeration in the bases. Improvement in pleural effusions. No pneumothorax. IMPRESSION: Endotracheal tube 7 cm above the carina Severe diffuse bilateral airspace disease with interval improvement from yesterday. Electronically Signed   By: Franchot Gallo M.D.   On: 2019-07-09 07:58   Dg Chest Port 1 View  Result Date: 07/02/2019 CLINICAL DATA:  Recently placed central line. EXAM: PORTABLE CHEST 1 VIEW COMPARISON:  Chest radiograph dated 07/02/2019. FINDINGS: There has been interval placement of a right internal jugular vascular sheath with tip overlying the superior vena cava. An endotracheal tube terminates in the midthoracic trachea. An enteric tube enters the stomach and terminates below the field of view. A left internal jugular central venous catheter tip overlies the superior cavoatrial junction. The heart size and mediastinal contours are not significantly changed. Diffuse bilateral interstitial opacities have increased since prior exam. Bilateral pleural effusions are not significantly changed given differences in technique. No pneumothorax is identified. IMPRESSION: Interval placement of a right internal jugular vascular sheath with tip overlying the superior vena  cava. No pneumothorax. Electronically Signed   By: Zerita Boers M.D.   On: 07/02/2019 15:49   Dg Chest Port 1 View  Result Date: 07/02/2019 CLINICAL DATA:  Respiratory failure. EXAM: PORTABLE CHEST 1 VIEW COMPARISON:  Radiograph of July 01, 2019. FINDINGS: The heart size and mediastinal contours are within normal limits. Endotracheal and nasogastric tubes are in grossly good position. Left internal jugular catheter is unchanged and in good position. No pneumothorax is noted. Stable bilateral interstitial lung opacities are noted concerning for edema or inflammation, with stable bilateral pleural effusions. The visualized skeletal structures are unremarkable. IMPRESSION: Stable bilateral lung opacities are noted concerning for edema or inflammation with stable mild bilateral pleural effusions. Stable support apparatus. Aortic Atherosclerosis (ICD10-I70.0). Electronically Signed   By: Marijo Conception M.D.   On: 07/02/2019 10:16   Dg Chest Port 1 View  Result Date: 07/01/2019 CLINICAL DATA:  Intubated patient with acute respiratory failure and acute kidney injury. EXAM: PORTABLE CHEST 1 VIEW COMPARISON:  Single-view of the chest 07/16/2019. PA and lateral chest 06/24/2019. FINDINGS: Support tubes and lines are unchanged since yesterday's examination. Extensive bilateral airspace disease persists but appears mildly improved. There are bilateral pleural effusions. No pneumothorax. Heart size is normal. IMPRESSION: Support apparatus projects in good position and is unchanged. Mild improvement in diffuse bilateral airspace disease. Pleural effusions again seen. Electronically Signed   By: Inge Rise M.D.   On: 07/01/2019 11:00   Dg Chest Port 1 View  Result Date: 07/11/2019 CLINICAL DATA:  78 year old female with central line placement. EXAM: PORTABLE CHEST 1 VIEW COMPARISON:  Chest radiograph dated 07/19/2019 and CT dated 01/06/2019 FINDINGS: Endotracheal and enteric tube in similar position. There  has been interval placement of a left IJ central venous line with tip close to the cavoatrial junction. No  pneumothorax. No significant interval change in the appearance of the lungs since the earlier radiograph. Stable cardiac silhouette. Atherosclerotic calcification of the aorta. No acute osseous pathology. IMPRESSION: Interval placement of a left IJ central venous line with tip close to the cavoatrial junction. No pneumothorax. Electronically Signed   By: Anner Crete M.D.   On: 07/17/2019 17:10   Dg Chest Port 1 View  Result Date: 07/21/2019 CLINICAL DATA:  Status post intubation. EXAM: PORTABLE CHEST 1 VIEW COMPARISON:  07/16/2019 FINDINGS: The endotracheal tube tip is stable above the carina. Enteric tube tip is below the GE junction. Normal heart size. Bilateral pleural effusions are identified, increased from previous exam. Diffuse bilateral interstitial and airspace opacities are again noted and appear unchanged. IMPRESSION: 1. Satisfactory position of ET tube and enteric tube. 2. No change in aeration of both lungs. 3. Increase in bilateral pleural effusions. Electronically Signed   By: Kerby Moors M.D.   On: 07/01/2019 14:06    Microbiology Recent Results (from the past 240 hour(s))  Blood culture (routine x 2)     Status: None   Collection Time: 07/01/2019  2:35 PM   Specimen: BLOOD LEFT HAND  Result Value Ref Range Status   Specimen Description BLOOD LEFT HAND  Final   Special Requests   Final    AEROBIC BOTTLE ONLY Blood Culture results may not be optimal due to an inadequate volume of blood received in culture bottles   Culture   Final    NO GROWTH 5 DAYS Performed at [facility], [place] [address], [place], [place] [address]    Report Status 07/04/2019 FINAL  Final  Blood culture (routine x 2)     Status: None   Collection Time: 07/05/2019  2:40 PM   Specimen: BLOOD LEFT HAND  Result Value Ref Range Status   Specimen Description BLOOD LEFT HAND  Final   Special  Requests   Final    AEROBIC BOTTLE ONLY Blood Culture results may not be optimal due to an inadequate volume of blood received in culture bottles   Culture   Final    NO GROWTH 5 DAYS Performed at [facility], [place] [address] [place], [place] [address]    Report Status 07/04/2019 FINAL  Final  Culture, respiratory (non-expectorated)     Status: None   Collection Time: 07/15/2019  3:50 PM   Specimen: Tracheal Aspirate; Respiratory  Result Value Ref Range Status   Specimen Description TRACHEAL ASPIRATE  Final   Special Requests NONE  Final   Gram Stain   Final    FEW WBC PRESENT,BOTH PMN AND MONONUCLEAR NO ORGANISMS SEEN    Culture   Final    Consistent with normal respiratory flora. Performed at [facility], [place] [address], [place], [place] [address]    Report Status 07/01/2019 FINAL  Final  Urine culture     Status: None   Collection Time: 07/07/2019  9:03 PM   Specimen: Urine, Random  Result Value Ref Range Status   Specimen Description URINE, RANDOM  Final   Special Requests NONE  Final   Culture   Final    NO GROWTH Performed at [facility], [place] [address], [place], [place] [address]    Report Status 06/30/2019 FINAL  Final  MRSA PCR Screening     Status: None   Collection Time: 06/30/19  7:33 AM   Specimen: Nasal Mucosa; Nasopharyngeal  Result Value Ref Range Status  MRSA by PCR NEGATIVE NEGATIVE Final    Comment:        The GeneXpert MRSA Assay (FDA approved for NASAL specimens only), is one component of a comprehensive MRSA colonization surveillance program. It is not intended to diagnose MRSA infection nor to guide or monitor treatment for MRSA infections. Performed at [facility], [place] [address] [place], [place] [address]     Lab Basic Metabolic Panel: Recent Labs  Lab 06/30/19 1200 07/01/19 0522  07/02/19 0403 07/02/19 0551 07/02/19 1600 07-09-19 0346 07-09-2019 0538 2019-07-09 1031  NA  --  138   < > 139 139 142 139 139  140  K  --  4.5   < > 3.7 3.6 3.6 4.2 4.1 3.9  CL  --  109  --  104  --  108 107  --   --   CO2  --  17*  --  19*  --  21* 17*  --   --   GLUCOSE  --  113*  --  231*  --  140* 64*  --   --   BUN  --  33*  --  30*  --  29* 20  --   --   CREATININE  --  2.11*  --  2.44*  --  2.41* 1.78*  --   --   CALCIUM  --  6.6*  --  6.6*  --  6.6* 7.4*  --   --   MG 1.7 2.1  --   --   --   --   --   --   --   PHOS 5.9* 6.9*  --   --   --  4.3 4.3  4.2  --   --    < > = values in this interval not displayed.   Liver Function Tests: Recent Labs  Lab 07/01/19 0522 07/02/19 0403 07/02/19 1600 Jul 09, 2019 0346  AST 385* 228*  --  245*  ALT 83* 58*  --  68*  ALKPHOS 217* 239*  --  315*  BILITOT 2.5* 3.3*  --  4.0*  PROT <3.0* 3.1*  --  3.4*  ALBUMIN 1.0* 1.2* 1.1* 1.2*  1.2*   No results for input(s): LIPASE, AMYLASE in the last 168 hours. No results for input(s): AMMONIA in the last 168 hours. CBC: Recent Labs  Lab 07/01/19 0522  07/02/19 0400  07/02/19 1023 07/02/19 1600 07/02/19 2235 2019-07-09 0346 July 09, 2019 0538 07/09/2019 1031  WBC 15.2*   < > 11.8*  --  14.5* 16.2* 22.0* 18.0*  --   --   NEUTROABS 13.1*  --   --   --   --   --   --   --   --   --   HGB 7.5*   < > 7.8*   < > 7.7* 7.5* 8.5* 7.9* 7.1* 7.1*  HCT 23.2*   < > 22.8*   < > 22.3* 21.7* 24.6* 24.1* 21.0* 21.0*  MCV 93.5   < > 90.1  --  88.1 88.2 88.5 92.3  --   --   PLT 134*   < > 66*  --  62* 55* 53* 43*  --   --    < > = values in this interval not displayed.   Cardiac Enzymes: No results for input(s): CKTOTAL, CKMB, CKMBINDEX, TROPONINI in the last 168 hours. Sepsis Labs: Recent Labs  Lab 07/01/19 1106  07/02/19 1023 07/02/19 1600 07/02/19 2235 Jul 09, 2019 0346  PROCALCITON 55.61  --   --   --   --   --   WBC 17.5*   < > 14.5* 16.2* 22.0* 18.0*   < > = values in this interval not displayed.    Procedures/Operations     Jennet Maduro 07/07/2019, 10:59 AM

## 2019-07-23 NOTE — Progress Notes (Addendum)
NAME:  [name], MRN:  [id number], DOB:  [birth year], LOS: 4 ADMISSION DATE:  06/25/2019, CONSULTATION DATE:  07/19/2019 REFERRING MD:  Oval Linsey, CHIEF COMPLAINT:  Bleeding   Brief History   [name] is a 78 yo F w/ PMH of Hypothyroidism, PAD, HFpEF, HTN, DM and admit for fall at [facility] with hip fracture. Hospital course complicated by PE and UTI now transferred to [facility] for intubation and pressor support after developing diffuse bleeding on hospital day 9. Now with prolonged bleeding with elevated INR requiring multiple transfusions.  Significant Hospital Events   8/28 Admit to [facility] 8/29 L Hemiarthroplasty 9/2 UTI, started on abx 9/3 V/Q scan diagnosed LUL defect started on eliquis for PE 9/5 Bleeding events started, eliquis d/c 9/7 Worsening bleed w/ hypotension requiring pressors 9/7 Intubated at [facility] 9/7 Transfer to [facility] 9/10 Start CRRT  Consults:  Heme/Onc (telephone)  Procedures:  8/29 L hemiarthroplasty 9/7 Intubation @ [facility] 9/7 R IJ placement>> 9/8 R femoral arterial line placement >>  Significant Diagnostic Tests:  9/3 @ [facility]: LUL ventilation/perfusion defect 9/2 Chest X-ray personally reviewed: poor inspiratory efforts, bilateral infiltrates, vascular congestion. 9/7 Chest X-ray personally reviewed: No pleural effusion, Bilateral diffuse opacities R > L.  9/9 Chest X-ray personally review: Improving bilateral diffuse opacities. Now  W/ R pleural effusion 9/10 Chest X-ray personally reviewed: worsening bilateral pleural effusions 9/11 Chest x-ray personally reviewed: Improved vascular congestion and bilateral pleural effusions  Micro Data:  9/7 BCx pending 9/7 UCx pending 9/7 RespCx pending 9/8 MRSA PCR negative  Antimicrobials:  9/2 Levaquin >>>9/10 9/3 Aztreonam > 9/7 9/2 Diflucan > 9/4 9/7 Vanc >>9/8  Interim history/subjective:  [name] was examined and evaluated at bedside this am with nursing staff. Overnight, she had  experienced seizure like activity and was given ativan and keppra. Neurology was consulted and CT head was ordered as well. She continues to be off sedation but is unresponsive to verbal or painful stimuli this am.  Objective   Blood pressure (!) 108/36, pulse (!) 108, temperature 98.4 F (36.9 C), resp. rate (!) 26, height 5\' 2"  (1.575 m), weight 70.1 kg, SpO2 (!) 60 %.    Vent Mode: PRVC FiO2 (%):  [60 %-70 %] 60 % Set Rate:  [30 bmp] 30 bmp Vt Set:  [400 mL] 400 mL PEEP:  [8 cmH20] 8 cmH20 Plateau Pressure:  [17 cmH20-27 cmH20] 20 cmH20   Intake/Output Summary (Last 24 hours) at Jul 12, 2019 0736 Last data filed at 07/12/2019 0700 Gross per 24 hour  Intake 1621.57 ml  Output 1378 ml  Net 243.57 ml   Filed Weights   07/01/19 0500 07/02/19 0410 07-12-2019 0500  Weight: 62.7 kg 69.7 kg 70.1 kg   Examination: Gen: ill-appearing female HEENT: ET tube in place, dried blood noted around oropharynx CV: Tachycardic, regular rhythm, S1, S2 normal Pulm: Diffuse rhonchi, no wheezing Abd: Soft, non-distended Extm: Anasarca with 2+ pitting edema on abdomen, bilateral upper and lower extremities Skin: Pallor, clean bandaging on hip surgical site  Resolved Hospital Problem list     Assessment & Plan:   Goals of Care discussion Ongoing discussion w/ family regarding poor prognosis. Made Limited code w/ no compression or shock 07/01/19 - Will c/w goals of care discussion  Diffuse bleeding 2/2 DOAC use + antiplatelet therapy + liver dysfunction Thrombocytopenia INR improving 2.4->2.5->2.0->1.9 Platelets 149->135->66->43. Fibrinogen 414. DIC ruled out. No significant bleeding event since yesterday but worsening thrombocytopenia. Current hgb 7.9 - Appreciate heme/onc recs: Supportive care, transfuse if needed -  Trend CBC q12 hr - Transfuse if <7  Seizure like activity Overnight developed posturing with tachycardia thought to be due to possible seizures. Given Ativan and Keppra. Neurology  consulted. CT head not performed yet. Possibly due to acidosis vs hypoglycemia - Appreciate neuro recs - Hypoglycemic to 30s this am despite ongoing tube feeds. Dextrose amps given per nursing  Anasarca 2/2 hypoalbuminema Pitting edema on exam up to abdomen. Albumin 1.0->1.2 after 4 units of FFP 07/01/19 - C/w monitor - C/w tube feeds  Acute Kidney Injury 2/2 ATN Metabolic Acidosis Anuria Started on CRRT per nephro for volume and anuria. AM ABG pH of 7.185, pCO2 , Bicarb 16. Likely due to lacticemia from seizure activity this morning - Appreciate nephro recs - Bicarb amps x2 - Trend renal function - Avoid nephrotoxic meds  Hypothermia, Leukocytosis 2/2 Sepsis vs Severe anemia Continues to be hypothermic. Fluctuating leukocytosis. 20.2->13.9->11.8->18 Procalcitonin 55.61 after 9 days of abx. All cultures negative so far. Abx d/ced yesterday - C/w bair hugger - F/u blood/urine cultures  Hypoxic respiratory failure requiring intubation Pulmonary Embolism Blood gas currently improved to pH 7.185, pCO2 36.3, pO2 113, Bicarb 19 w/ increased RR. - Trend ABGs - C/w intubation with PRVC keep O2 sat >90 , plateau <30 - VAP protocol  - SBT as appropriate - Sedation w/ fentanyl, goal RASS-1 - Hold diuresis in setting of AKI - Hold anticoagulation in setting of bleed  Hypothyroidism TSH 1.031. Current dose appropriate - C/w home dose as IV: IV levothyroxine 62.80mcg  Depression/Anxiety Paroxetine 40mg  at home - Holding home psych meds for NPO  Best practice:  Diet: NPO Pain/Anxiety/Delirium protocol (if indicated): Fentanyl VAP protocol (if indicated): Y DVT prophylaxis: Currently in active bleed GI prophylaxis: Pantoprazole Glucose control: SSI Mobility: BR Code Status: DNR Family Communication: Will update at bedside when family arrives Disposition: ICU  Labs   CBC: Recent Labs  Lab 07/02/2019 1310  07/01/19 0522  07/02/19 0400  07/02/19 1023 07/02/19 1600 07/02/19 2235  07/22/2019 0346 07/22/19 0538  WBC 15.3*   < > 15.2*   < > 11.8*  --  14.5* 16.2* 22.0* 18.0*  --   NEUTROABS 13.8*  --  13.1*  --   --   --   --   --   --   --   --   HGB 6.7*   < > 7.5*   < > 7.8*   < > 7.7* 7.5* 8.5* 7.9* 7.1*  HCT 20.8*   < > 23.2*   < > 22.8*   < > 22.3* 21.7* 24.6* 24.1* 21.0*  MCV 93.3   < > 93.5   < > 90.1  --  88.1 88.2 88.5 92.3  --   PLT 170   < > 134*   < > 66*  --  62* 55* 53* 43*  --    < > = values in this interval not displayed.    Basic Metabolic Panel: Recent Labs  Lab 06/30/19 0440 06/30/19 1200 07/01/19 0522  07/02/19 0403 07/02/19 0551 07/02/19 1600 2019-07-22 0346 Jul 22, 2019 0538  NA 137  --  138   < > 139 139 142 139 139  K 3.5  --  4.5   < > 3.7 3.6 3.6 4.2 4.1  CL 107  --  109  --  104  --  108 107  --   CO2 19*  --  17*  --  19*  --  21* 17*  --   GLUCOSE  97  --  113*  --  231*  --  140* 64*  --   BUN 34*  --  33*  --  30*  --  29* 20  --   CREATININE 1.69*  --  2.11*  --  2.44*  --  2.41* 1.78*  --   CALCIUM 7.0*  --  6.6*  --  6.6*  --  6.6* 7.4*  --   MG  --  1.7 2.1  --   --   --   --   --   --   PHOS  --  5.9* 6.9*  --   --   --  4.3 4.3  4.2  --    < > = values in this interval not displayed.   GFR: Estimated Creatinine Clearance: 23.5 mL/min (A) (by C-G formula based on SCr of 1.78 mg/dL (H)). Recent Labs  Lab 07/14/2019 2103  07/01/19 1106  07/02/19 1023 07/02/19 1600 07/02/19 2235 07/04/2019 0346  PROCALCITON  --   --  55.61  --   --   --   --   --   WBC  --    < > 17.5*   < > 14.5* 16.2* 22.0* 18.0*  LATICACIDVEN 2.6*  --   --   --   --   --   --   --    < > = values in this interval not displayed.    Liver Function Tests: Recent Labs  Lab 07/12/2019 1354 06/25/2019 2232 06/30/19 0440 07/01/19 0522 07/02/19 0403 07/02/19 1600 07-04-19 0346  AST 661* 524* 600* 385* 228*  --   --   ALT 151* 119* 133* 83* 58*  --   --   ALKPHOS 201* 157* 183* 217* 239*  --   --   BILITOT 2.3* 2.8* 3.1* 2.5* 3.3*  --   --   PROT 3.7*  3.6* 3.7* <3.0* 3.1*  --   --   ALBUMIN 1.7* 1.6* 1.6* 1.0* 1.2* 1.1* 1.2*   No results for input(s): LIPASE, AMYLASE in the last 168 hours. No results for input(s): AMMONIA in the last 168 hours.  ABG    Component Value Date/Time   PHART 7.185 (LL) 07-04-2019 0538   PCO2ART 44.4 07/04/2019 0538   PO2ART 129.0 (H) 07-04-2019 0538   HCO3 16.4 (L) Jul 04, 2019 0538   TCO2 18 (L) 2019/07/04 0538   ACIDBASEDEF 11.0 (H) 07-04-19 0538   O2SAT 98.0 07-04-2019 0538     Coagulation Profile: Recent Labs  Lab 07/01/19 1733 07/02/19 0403 07/02/19 1600 07/02/19 2232 07-04-19 0346  INR 2.0* 2.0* 2.1* 1.9* 1.9*    Cardiac Enzymes: No results for input(s): CKTOTAL, CKMB, CKMBINDEX, TROPONINI in the last 168 hours.  HbA1C: Hgb A1c MFr Bld  Date/Time Value Ref Range Status  07/09/2019 01:55 PM 6.4 (H) 4.8 - 5.6 % Final    Comment:    (NOTE) Pre diabetes:          5.7%-6.4% Diabetes:              >6.4% Glycemic control for   <7.0% adults with diabetes   07/09/2018 03:04 PM 9.9 (H) 4.8 - 5.6 % Final    Comment:    (NOTE)         Prediabetes: 5.7 - 6.4         Diabetes: >6.4         Glycemic control for adults with diabetes: <7.0     CBG: Recent Labs  Lab 07/02/19 1205 07/02/19 1612 07/02/19 1958 07-15-2019 0035 07-15-2019 0403  GLUCAP 194* 137* 88 70 110*    Review of Systems:   Unable to assess due to sedation  Past Medical History  She,  has a past medical history of AAA (abdominal aortic aneurysm) ([place]) (02/26/2012), Arthritis, 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), Peripheral vascular disease ([place]), PVD (peripheral vascular disease) with claudication ([place]) (02/26/2012), Sleep apnea, and Type II or unspecified type diabetes mellitus with peripheral circulatory disorders, uncontrolled(250.72) (06/15/2014).   Surgical History    Past Surgical History:  Procedure Laterality Date  . ABDOMINAL ANGIOGRAM N/A 01/31/2012   Procedure: ABDOMINAL ANGIOGRAM;  Surgeon: Conrad Bristol, MD;  Location: [facility] CATH LAB;  Service: Cardiovascular;  Laterality: N/A;  . ABDOMINAL AORTOGRAM N/A 07/16/2017   Procedure: ABDOMINAL AORTOGRAM;  Surgeon: Serafina Mitchell, MD;  Location: [place] CV LAB;  Service: Cardiovascular;  Laterality: N/A;  . ABDOMINAL AORTOGRAM W/LOWER EXTREMITY N/A 06/24/2018   Procedure: ABDOMINAL AORTOGRAM W/LOWER EXTREMITY;  Surgeon: Serafina Mitchell, MD;  Location: [place] CV LAB;  Service: Cardiovascular;  Laterality: N/A;  . APPENDECTOMY    . BREAST SURGERY  2009   lumpectomy Rt breast  . CHOLECYSTECTOMY    . FEET SURGERY    . FEMORAL-POPLITEAL BYPASS GRAFT Right 07/09/2018   Procedure: RIGHT FEMORAL ARTERY TO BELOW THE KNEE POPLITEAL ARTERY BYPASS GRAFT USING SAPHENOUS VEIN;  Surgeon: Serafina Mitchell, MD;  Location: [place];  Service: Vascular;  Laterality: Right;  . GROIN DISSECTION Right 07/09/2018   Procedure: REDO RIGHT COMMON FEMORAL EXPLORATION;  Surgeon: Serafina Mitchell, MD;  Location: [facility] OR;  Service: Vascular;  Laterality: Right;  . LOWER EXTREMITY ANGIOGRAPHY Bilateral 07/16/2017   Procedure: Lower Extremity Angiography;  Surgeon: Serafina Mitchell, MD;  Location: [place] CV LAB;  Service: Cardiovascular;  Laterality: Bilateral;  . PR VEIN BYPASS GRAFT,AORTO-FEM-POP    . TUBAL LIGATION    . VEIN HARVEST Right 07/09/2018   Procedure: RIGHT LEG SAPHENOUS VEIN HARVEST;  Surgeon: Serafina Mitchell, MD;  Location[facility] OR;  Service: Vascular;  Laterality: Right;     Social History   reports  that she has been smoking cigarettes. She has a 32.50 pack-year smoking history. She has never used smokeless tobacco. She reports previous alcohol use. She reports that she does not use drugs.   Family History   Her family history includes Cancer in her father and sister; Heart disease in her mother. There is no history of Anesthesia problems.   Allergies Allergies  Allergen Reactions  . Penicillins Swelling and Rash    Has patient had a PCN reaction causing immediate rash, facial/tongue/throat swelling, SOB or lightheadedness with hypotension: No Has patient had a PCN reaction causing severe rash involving mucus membranes or skin necrosis: No Has patient had a PCN reaction that required hospitalization: No Has patient had a PCN reaction occurring within the last 10 years:No If all of the above answers are "NO", then may proceed with Cephalosporin use.      Home Medications  Prior to Admission medications   Medication Sig Start Date End Date Taking? Authorizing Provider  ACCU-CHEK AVIVA PLUS test strip  02/17/19   [provider]  Accu-Chek FastClix Lancets MISC  02/17/19   [provider]  alendronate (FOSAMAX) 70 MG tablet Take 70 mg by mouth once a week. Take with a full glass of water on an empty stomach.  Takes on Friday    [provider]  amLODipine-benazepril (LOTREL) 5-40 MG capsule Take 1 capsule by mouth at bedtime. 05/15/17   [provider]  anastrozole (ARIMIDEX) 1 MG tablet  02/17/19   [provider]  aspirin 325 MG tablet Take 325 mg by mouth daily.    [provider]  atenolol (TENORMIN) 100 MG tablet Take 100 mg by mouth daily.    [provider]  atorvastatin (LIPITOR) 40 MG tablet Take 40 mg by mouth at bedtime. 05/15/17   [provider]  gabapentin (NEURONTIN) 300 MG capsule Take 300 mg by mouth 2 (two) times daily.    [provider]  hydrALAZINE (APRESOLINE) 25 MG tablet Take 25 mg by  mouth 3 (three) times daily.    [provider]  Insulin Glargine (LANTUS SOLOSTAR) 100 UNIT/ML Solostar Pen Inject 20 Units into the skin daily at 10 pm. Patient taking differently: Inject 10 Units into the skin daily at 10 pm.  07/16/18   Rhyne, Hulen Shouts, PA-C  lansoprazole (PREVACID) 30 MG capsule Take 30 mg by mouth 2 (two) times daily. 05/15/17   [provider]  levothyroxine (SYNTHROID, LEVOTHROID) 137 MCG tablet Take 137 mcg by mouth daily before breakfast.    [provider]  losartan (COZAAR) 100 MG tablet Take 100 mg by mouth daily with breakfast. 06/20/17   [provider]  nicotine (NICODERM CQ - DOSED IN MG/24 HOURS) 14 mg/24hr patch Place 14 mg onto the skin daily.    [provider]  PARoxetine (PAXIL) 40 MG tablet Take 40 mg by mouth every morning.    [provider]  potassium chloride SA (K-DUR,KLOR-CON) 20 MEQ tablet Take 20 mEq by mouth daily.    [provider]  rOPINIRole (REQUIP) 4 MG tablet Take 4 mg by mouth at bedtime.    [provider]  torsemide (DEMADEX) 20 MG tablet Take 40 mg by mouth daily.    [provider]  Nicholes Calamity 31G X 5 MM [place]  02/17/19   [provider]     Critical care time:     Attending attestation to follow  Gilberto Better, MD PGY2, [place] IM Pager: [phone]  Attending Note:  78 year old female with MODS at this point, continues to bleed.  Severe metabolic acidosis and seizure overnight now completely unresponsive on exam with respiratory drive only.  Hypoglycemia overnight, indicating liver failure, respiratory failure, renal failure and now neurologic failure.  I reviewed CXR myself, ETT is too high, move down by 3 cm and pulmonary edema noted.  Will obtain head CT STAT now then talk to the family regarding plan of care.  PCCM will continue to follow.  The patient is critically ill with multiple organ systems failure and requires high  complexity decision making for assessment and support, frequent evaluation and titration of therapies, application of advanced monitoring technologies and extensive interpretation of multiple databases.   Critical Care Time devoted to patient care services described in this note is  35  Minutes. This time reflects time of care of this signee Dr Jennet Maduro. This critical care time does not reflect procedure time, or teaching time or supervisory time of PA/NP/Med student/Med Resident  etc but could involve care discussion time.  Rush Farmer, M.D. [facility] Pulmonary/Critical Care Medicine. Pager: [phone]. After hours pager: [phone].

## 2019-07-23 NOTE — Progress Notes (Signed)
Pt transported from 2M14 to CT3 and back without incident.

## 2019-07-23 NOTE — Progress Notes (Signed)
eLink Physician-Brief Progress Note Patient Name: [name] DOB: [birth year] MRN: [id number]   Date of Service  08/01/19  HPI/Events of Note  A-fib RVR  eICU Interventions  Amiodarone 150 mg iv bolus x 1        [place]wo U Ogan 08/01/19, 3:33 AM

## 2019-07-23 NOTE — Consult Note (Signed)
NEURO HOSPITALIST CONSULT NOTE   Requestig physician: Dr. Nelda Marseille  Reason for Consult: New onset seizures  History obtained from:   Chart     HPI:                                                                                                                                          [name] is an 78 y.o. female with multiple medical comorbidities who initially presented to [facility] with a hip fracture. She had a complicated hospital course there, with PE and UTI as well as ARF, bleeding diathesis requiring multiple transfusions and decreased responsiveness. CT at OSH on 8/22 showed no acute changes. She is currently intubated on fentanyl sedation.   This morning, she began to exhibit new onset of recurrent clinical seizure activity, which resolved with IV Ativan.   Past Medical History:  Diagnosis Date  . AAA (abdominal aortic aneurysm) ([place]) 02/26/2012  . Arthritis   . 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 Blood transfusion 2011  . Cancer (HCC)    BREAST  . Claudication of both lower extremities ([place]) 05/28/2017  . COPD (chronic obstructive pulmonary disease) ([place])   . Depression   . Diabetes mellitus    type 2 IDDM x 10 years  . Diabetes mellitus with peripheral vascular disease ([place]) 05/28/2017  . GERD (gastroesophageal reflux disease)   . Hip pain   . Hypertension   . Hypothyroidism   . Iliac artery occlusion ([place]) 02/26/2012  . Myocardial infarction [facility])    unsure, looks as if she may have had one in the past  . Peripheral vascular disease ([place])   . PVD (peripheral vascular disease) with claudication ([place]) 02/26/2012  . Sleep apnea    does not use cpap   . Type II or unspecified type diabetes mellitus with peripheral circulatory disorders, uncontrolled(250.72) 06/15/2014    Past Surgical History:  Procedure Laterality Date  . ABDOMINAL ANGIOGRAM N/A 01/31/2012   Procedure: ABDOMINAL  ANGIOGRAM;  Surgeon: Conrad Huron, MD;  Location: [facility] CATH LAB;  Service: Cardiovascular;  Laterality: N/A;  . ABDOMINAL AORTOGRAM N/A 07/16/2017   Procedure: ABDOMINAL AORTOGRAM;  Surgeon: Serafina Mitchell, MD;  Location: [facility] CV LAB;  Service: Cardiovascular;  Laterality: N/A;  . ABDOMINAL AORTOGRAM W/LOWER EXTREMITY N/A 06/24/2018   Procedure: ABDOMINAL AORTOGRAM W/LOWER EXTREMITY;  Surgeon: Serafina Mitchell, MD;  Location: [place] CV LAB;  Service: Cardiovascular;  Laterality: N/A;  . APPENDECTOMY    . BREAST SURGERY  2009   lumpectomy Rt breast  . CHOLECYSTECTOMY    . FEET SURGERY    . FEMORAL-POPLITEAL BYPASS GRAFT Right 07/09/2018   Procedure: RIGHT FEMORAL ARTERY TO BELOW THE KNEE POPLITEAL ARTERY BYPASS GRAFT USING  SAPHENOUS VEIN;  Surgeon: Serafina Mitchell, MD;  Location: [facility] OR;  Service: Vascular;  Laterality: Right;  . GROIN DISSECTION Right 07/09/2018   Procedure: REDO RIGHT COMMON FEMORAL EXPLORATION;  Surgeon: Serafina Mitchell, MD;  Location: [facility] OR;  Service: Vascular;  Laterality: Right;  . LOWER EXTREMITY ANGIOGRAPHY Bilateral 07/16/2017   Procedure: Lower Extremity Angiography;  Surgeon: Serafina Mitchell, MD;  Location: [facility] CV LAB;  Service: Cardiovascular;  Laterality: Bilateral;  . PR VEIN BYPASS GRAFT,AORTO-FEM-POP    . TUBAL LIGATION    . VEIN HARVEST Right 07/09/2018   Procedure: RIGHT LEG SAPHENOUS VEIN HARVEST;  Surgeon: Serafina Mitchell, MD;  Location: [facility] OR;  Service: Vascular;  Laterality: Right;    Family History  Problem Relation Age of Onset  . Heart disease Mother   . Cancer Father        LUNG  . Cancer Sister        BONE  . Anesthesia problems Neg Hx               Social History:  reports that she has been smoking cigarettes. She has a 32.50 pack-year smoking history. She has never used smokeless tobacco. She reports previous alcohol use. She reports that she does not use drugs.  Allergies  Allergen Reactions  . Penicillins Swelling and  Rash    Has patient had a PCN reaction causing immediate rash, facial/tongue/throat swelling, SOB or lightheadedness with hypotension: No Has patient had a PCN reaction causing severe rash involving mucus membranes or skin necrosis: No Has patient had a PCN reaction that required hospitalization: No Has patient had a PCN reaction occurring within the last 10 years:No If all of the above answers are "NO", then may proceed with Cephalosporin use.     MEDICATIONS:                                                                                                                     Prior to Admission:  Medications Prior to Admission  Medication Sig Dispense Refill Last Dose  . ACCU-CHEK AVIVA PLUS test strip    unknown at unknown  . Accu-Chek FastClix Lancets MISC    unknown at unknown  . aspirin EC 81 MG tablet Take 81 mg by mouth daily.   Past Week at Unknown time  . atenolol (TENORMIN) 100 MG tablet Take 100 mg by mouth daily.   Past Week at Unknown time  . atorvastatin (LIPITOR) 40 MG tablet Take 40 mg by mouth at bedtime.  5 Past Week at Unknown time  . gabapentin (NEURONTIN) 300 MG capsule Take 300 mg by mouth daily.    Past Week at Unknown time  . hydrALAZINE (APRESOLINE) 25 MG tablet Take 25 mg by mouth 3 (three) times daily.   Past Week at Unknown time  . lansoprazole (PREVACID) 30 MG capsule Take 30 mg by mouth 2 (two) times daily.  5 Past Week at Unknown time  . levothyroxine (SYNTHROID) 125 MCG tablet  Take 125 mcg by mouth daily before breakfast.   Past Week at Unknown time  . Multiple Vitamin (MULTIVITAMIN WITH MINERALS) TABS tablet Take 1 tablet by mouth daily.   Past Week at Unknown time  . PARoxetine (PAXIL) 40 MG tablet Take 40 mg by mouth every morning.   Past Week at Unknown time  . rOPINIRole (REQUIP) 4 MG tablet Take 4 mg by mouth at bedtime.   Past Week at Unknown time  . UNIFINE PENTIPS 31G X 5 MM MISC       Scheduled: . sodium chloride   Intravenous Once  . chlorhexidine  gluconate (MEDLINE KIT)  15 mL Mouth Rinse BID  . Chlorhexidine Gluconate Cloth  6 each Topical Daily  . feeding supplement (PRO-STAT SUGAR FREE 64)  30 mL Per Tube BID  . feeding supplement (VITAL AF 1.2 CAL)  1,000 mL Per Tube Q24H  . fentaNYL (SUBLIMAZE) injection  25 mcg Intravenous Once  . insulin aspart  0-9 Units Subcutaneous Q4H  . levothyroxine  125 mcg Per Tube Q0600  . mouth rinse  15 mL Mouth Rinse 10 times per day  . pantoprazole (PROTONIX) IV  40 mg Intravenous Q12H   Continuous: .  prismasol BGK 4/2.5 500 mL/hr at 2019-07-14 0205  .  prismasol BGK 4/2.5 200 mL/hr at 07/02/19 1532  . fentaNYL infusion INTRAVENOUS Stopped (07/14/19 0800)  . levETIRAcetam    . norepinephrine (LEVOPHED) Adult infusion 4 mcg/min (07-14-2019 0800)  . prismasol BGK 4/2.5 1,000 mL/hr at 07-14-2019 0554     ROS:                                                                                                                                       Unable to obtain due to unresponsiveness.    Blood pressure (!) 108/36, pulse 78, temperature 98.4 F (36.9 C), resp. rate 20, height '5\' 2"'  (1.575 m), weight 69.7 kg, SpO2 (!) 89 %.   General Examination:                                                                                                       Physical Exam  HEENT-  Normocephalic  Lungs- Intubated   Extremities- Multiple ecchymoses in conjunction with thinning of skin. Decreased muscle bulk x 4.    Neurological Examination Mental Status: On fentanyl sedation No responses to any external stimuli. Eyes closed without opening to any stimulus. No spontaneous movement. No attempts to communicate.  Cranial Nerves: II:  Pupils 3 mm and sluggishly reactive. No blink to threat.   III,IV, VI: No oculocephalic reflex.  V,VII: Face flaccidly symmetric. No corneal reflexes VIII: No response to auditory stimuli.  IX,X: Intubated XI: Unable to test XII: Unable to test Motor/Sensory: Flaccid tone x 4.  No posturing or any other movement to noxious stimuli.  Deep Tendon Reflexes: Absent upper and lower extremity reflexes Cerebellar/Gait: Unable to assess   Lab Results: Basic Metabolic Panel: Recent Labs  Lab 06/30/19 0440 06/30/19 1200 07/01/19 0522  07/02/19 0403 07/02/19 0551 07/02/19 1600 July 04, 2019 0346 07/04/19 0538  NA 137  --  138   < > 139 139 142 139 139  K 3.5  --  4.5   < > 3.7 3.6 3.6 4.2 4.1  CL 107  --  109  --  104  --  108 107  --   CO2 19*  --  17*  --  19*  --  21* 17*  --   GLUCOSE 97  --  113*  --  231*  --  140* 64*  --   BUN 34*  --  33*  --  30*  --  29* 20  --   CREATININE 1.69*  --  2.11*  --  2.44*  --  2.41* 1.78*  --   CALCIUM 7.0*  --  6.6*  --  6.6*  --  6.6* 7.4*  --   MG  --  1.7 2.1  --   --   --   --   --   --   PHOS  --  5.9* 6.9*  --   --   --  4.3 4.3  4.2  --    < > = values in this interval not displayed.    CBC: Recent Labs  Lab 06/24/2019 1310  07/01/19 0522  07/02/19 0400  07/02/19 1023 07/02/19 1600 07/02/19 2235 07/04/19 0346 July 04, 2019 0538  WBC 15.3*   < > 15.2*   < > 11.8*  --  14.5* 16.2* 22.0* 18.0*  --   NEUTROABS 13.8*  --  13.1*  --   --   --   --   --   --   --   --   HGB 6.7*   < > 7.5*   < > 7.8*   < > 7.7* 7.5* 8.5* 7.9* 7.1*  HCT 20.8*   < > 23.2*   < > 22.8*   < > 22.3* 21.7* 24.6* 24.1* 21.0*  MCV 93.3   < > 93.5   < > 90.1  --  88.1 88.2 88.5 92.3  --   PLT 170   < > 134*   < > 66*  --  62* 55* 53* 43*  --    < > = values in this interval not displayed.    Cardiac Enzymes: No results for input(s): CKTOTAL, CKMB, CKMBINDEX, TROPONINI in the last 168 hours.  Lipid Panel: Recent Labs  Lab 06/25/2019 1354  TRIG 56    Imaging: US Renal  Result Date: 07/01/2019 CLINICAL DATA:  Acute kidney injury EXAM: RENAL / URINARY TRACT ULTRASOUND COMPLETE COMPARISON:  None. FINDINGS: Right Kidney: Renal measurements: 10.6 x 4.5 x 5.6 cm = volume: 138 mL. Echogenic renal parenchyma. Cortical thinning. No hydronephrosis.  Left Kidney: Renal measurements: 11.5 x 5.0 x 5.9 cm = volume: 176 mL. Echogenic renal parenchyma. Cortical thinning. No hydronephrosis. Bladder: Decompressed with indwelling Foley catheter. Additional comments: Bilateral pleural effusions.  Ascites. IMPRESSION:  No hydronephrosis. Echogenic renal parenchyma with cortical thinning bilaterally, suggesting medical renal disease. Bladder decompressed with indwelling Foley catheter. Electronically Signed   By: Julian Hy M.D.   On: 07/01/2019 15:21   Dg Chest Port 1 View  Result Date: 07/02/2019 CLINICAL DATA:  Recently placed central line. EXAM: PORTABLE CHEST 1 VIEW COMPARISON:  Chest radiograph dated 07/02/2019. FINDINGS: There has been interval placement of a right internal jugular vascular sheath with tip overlying the superior vena cava. An endotracheal tube terminates in the midthoracic trachea. An enteric tube enters the stomach and terminates below the field of view. A left internal jugular central venous catheter tip overlies the superior cavoatrial junction. The heart size and mediastinal contours are not significantly changed. Diffuse bilateral interstitial opacities have increased since prior exam. Bilateral pleural effusions are not significantly changed given differences in technique. No pneumothorax is identified. IMPRESSION: Interval placement of a right internal jugular vascular sheath with tip overlying the superior vena cava. No pneumothorax. Electronically Signed   By: Zerita Boers M.D.   On: 07/02/2019 15:49   Dg Chest Port 1 View  Result Date: 07/02/2019 CLINICAL DATA:  Respiratory failure. EXAM: PORTABLE CHEST 1 VIEW COMPARISON:  Radiograph of July 01, 2019. FINDINGS: The heart size and mediastinal contours are within normal limits. Endotracheal and nasogastric tubes are in grossly good position. Left internal jugular catheter is unchanged and in good position. No pneumothorax is noted. Stable bilateral interstitial lung  opacities are noted concerning for edema or inflammation, with stable bilateral pleural effusions. The visualized skeletal structures are unremarkable. IMPRESSION: Stable bilateral lung opacities are noted concerning for edema or inflammation with stable mild bilateral pleural effusions. Stable support apparatus. Aortic Atherosclerosis (ICD10-I70.0). Electronically Signed   By: Marijo Conception M.D.   On: 07/02/2019 10:16   Dg Chest Port 1 View  Result Date: 07/01/2019 CLINICAL DATA:  Intubated patient with acute respiratory failure and acute kidney injury. EXAM: PORTABLE CHEST 1 VIEW COMPARISON:  Single-view of the chest 07/09/2019. PA and lateral chest 06/24/2019. FINDINGS: Support tubes and lines are unchanged since yesterday's examination. Extensive bilateral airspace disease persists but appears mildly improved. There are bilateral pleural effusions. No pneumothorax. Heart size is normal. IMPRESSION: Support apparatus projects in good position and is unchanged. Mild improvement in diffuse bilateral airspace disease. Pleural effusions again seen. Electronically Signed   By: Inge Rise M.D.   On: 07/01/2019 11:00   CT head 06/13/19:   Assessment: 78 year old patient with multiple medical comorbidities including acute renal failure, on CRRT, with new onset recurrent seizures 1. AST 228 and ALT 58 2. Acidotic with pH of 7.185, most likely secondary to renal failure 3. On CRRT. BUN 20 and Cr 1.78 with eGFR in 20's - 30's 4. Neurological exam reveals no evidence for cortical function, which may be due to diffuse neuronal dysfunction secondary to severe metabolic derangement, anoxic brain injury, severe hypoglycemic brain injury or new brainstem or large bihemispheric infactions not present at the time of the 8/22 CT. Given her renal failure, residual sedation is also relatively high on the DDx.   Recommendations: 1. Starting on Keppra 500 mg IV BID mg with first dose in 12 hours following initial  loading dose of 1000 mg. Valproic acid and Dilantin are not good options given elevated transaminases.  2. Spot EEG. May need to be switched to LTM pending initial EEG result 3. Frequent neuro checks.  4. Inpatient seizure precautions.  5. Outpatient seizure precautions include no driving  until seizure free for at least 6 months. No activities that would result in injury to herself or others should she have a seizure.  6. Repeat CT of head. If negative, will need MRI brain.   45 minutes spent in the neurological evaluation and management of this critically ill patient  Electronically signed: Dr. Kerney Elbe 07/09/2019, 5:44 AM

## 2019-07-23 DEATH — deceased

## 2020-06-01 IMAGING — CR DG HIP (WITH OR WITHOUT PELVIS) 2-3V*R*
3 series · 3 of 3 positions shown · non-contrast
Comparison: None.

CLINICAL DATA: Right hip and groin pain.

EXAM:
DG HIP (WITH OR WITHOUT PELVIS) 2-3V RIGHT

[pelvis ap]
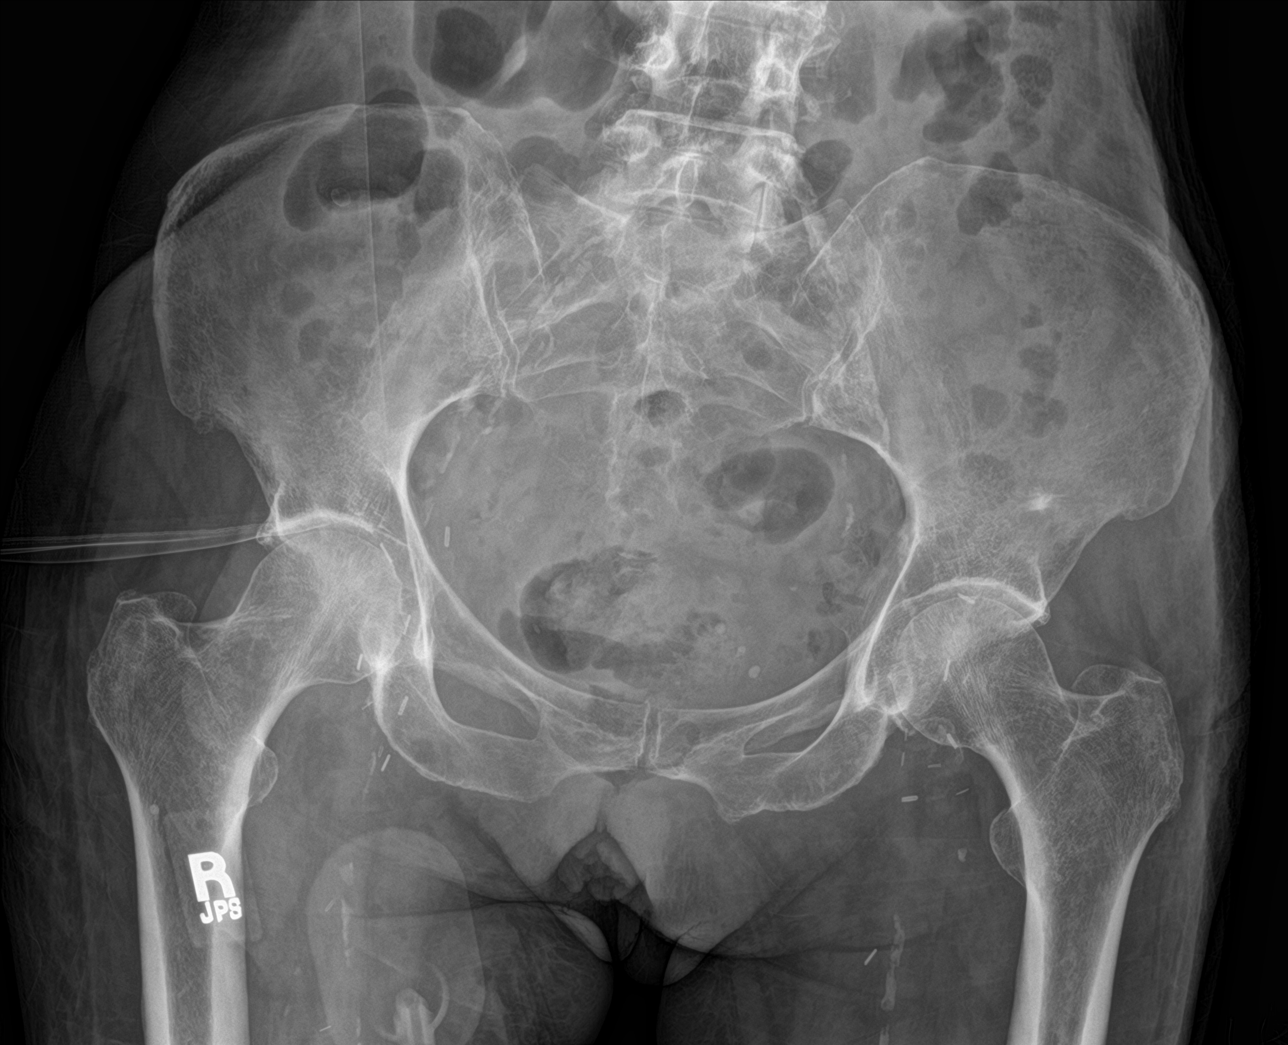

[hip ap]
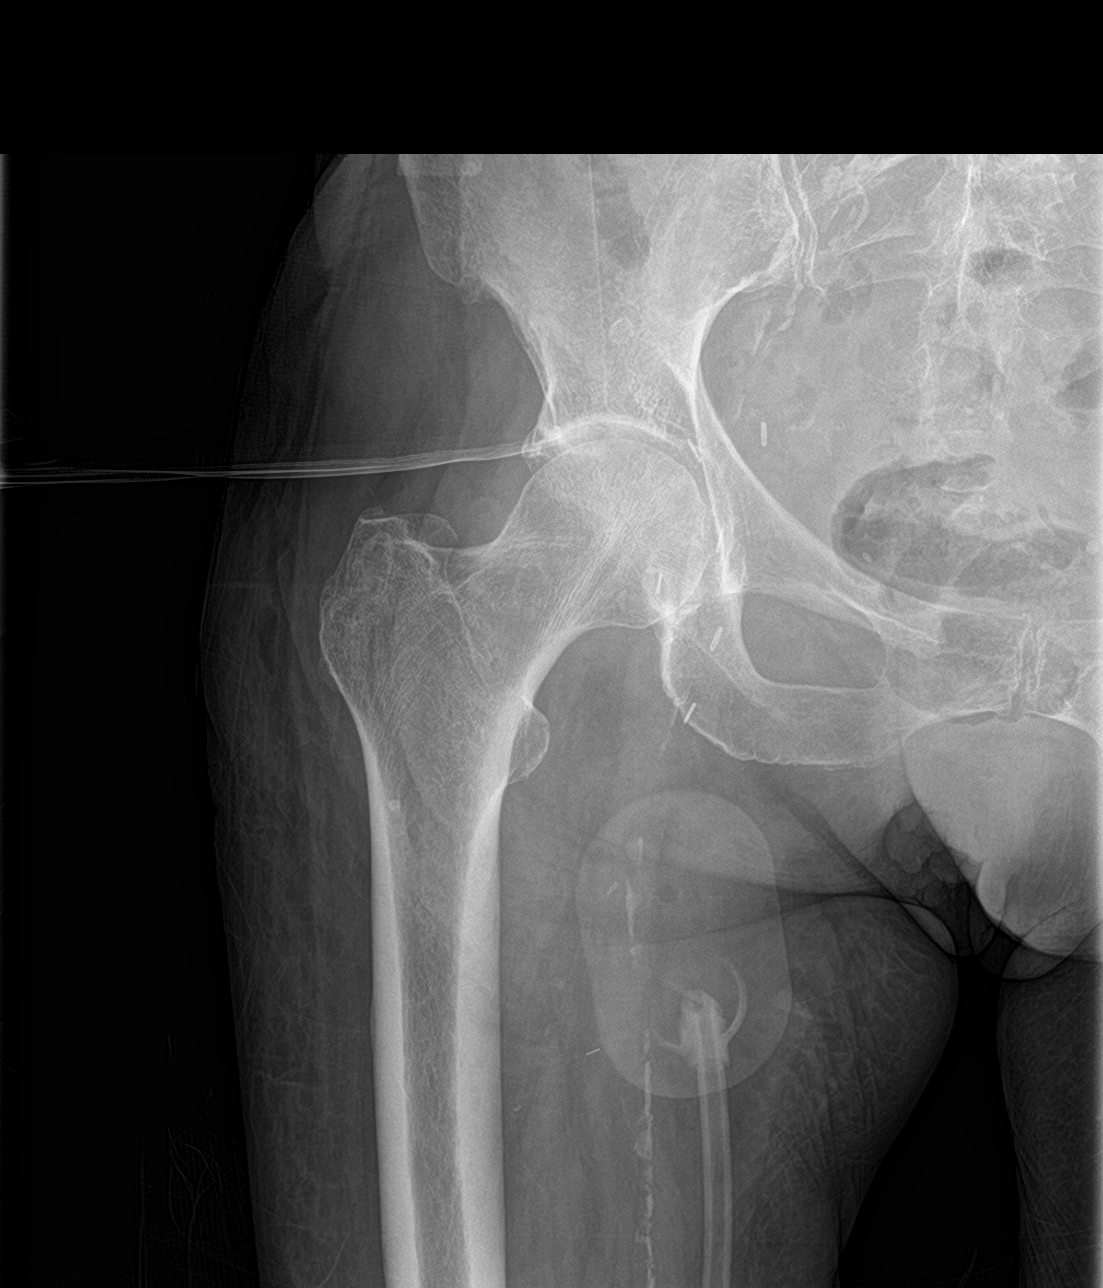

[hip lat]
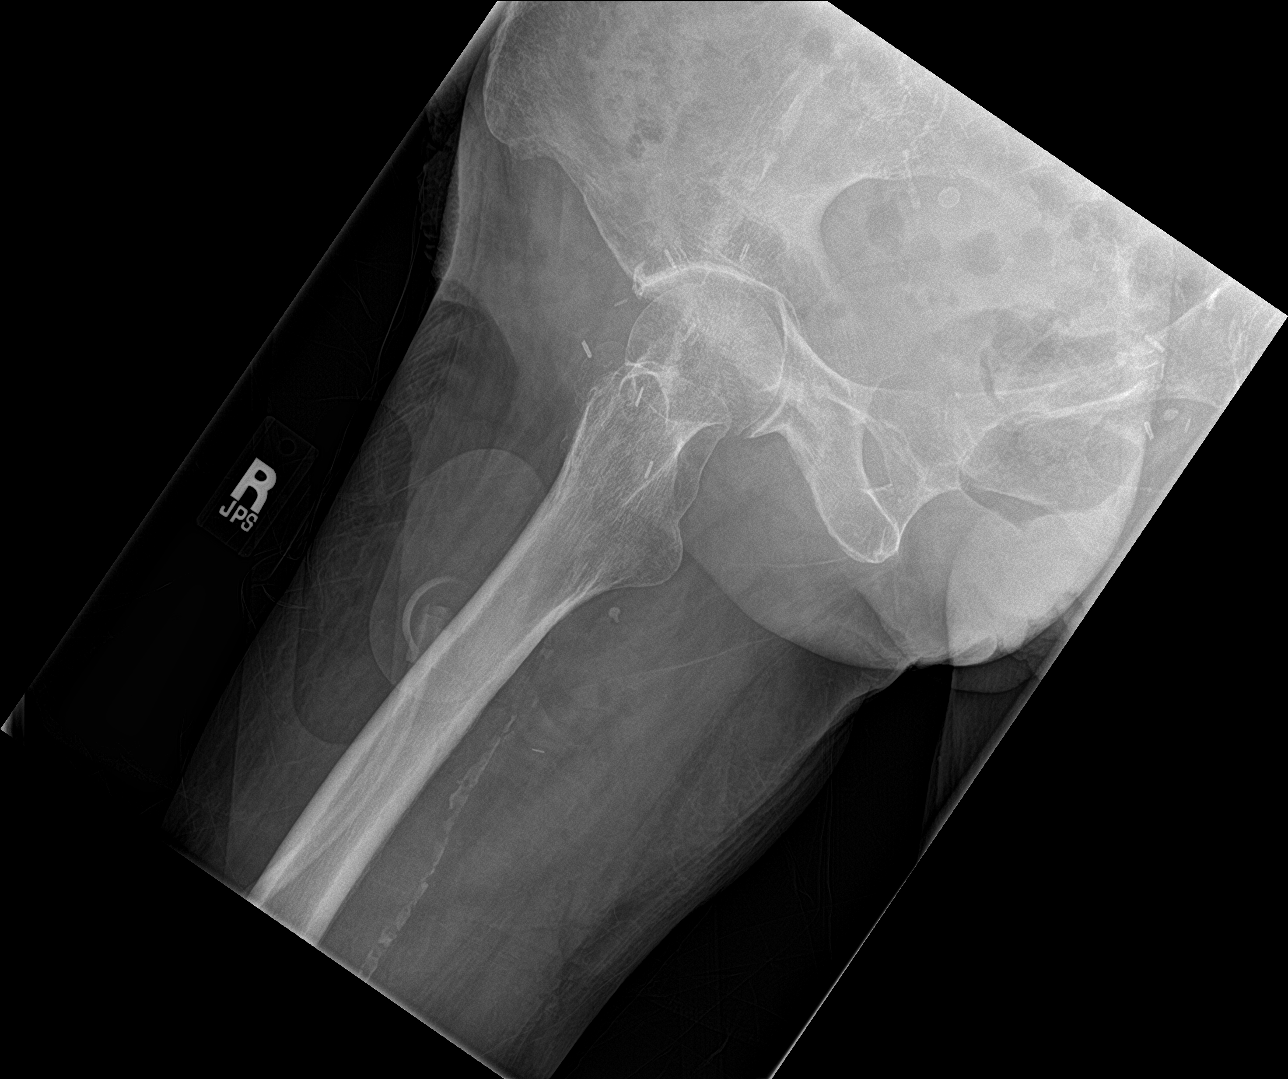

[3 of 3 positions shown; findings below may reference images not displayed]

FINDINGS: There is no evidence of hip fracture or dislocation. There is no
evidence of significant arthropathy or other focal bone abnormality.

Surgical clips in both groins.  Arterial vascular calcification.
IMPRESSION: No significant abnormality of the right hip.

## 2021-07-22 DEATH — deceased
